# Patient Record
Sex: Female | Born: 1955 | Race: White | Hispanic: No | Marital: Married | Smoking: Never smoker
Health system: Southern US, Community
[De-identification: ages and names within clinical notes are randomized; demographics above are authoritative.]

## PROBLEM LIST (undated history)

## (undated) DIAGNOSIS — K859 Acute pancreatitis without necrosis or infection, unspecified: Secondary | ICD-10-CM

## (undated) DIAGNOSIS — I2510 Atherosclerotic heart disease of native coronary artery without angina pectoris: Secondary | ICD-10-CM

## (undated) DIAGNOSIS — I10 Essential (primary) hypertension: Secondary | ICD-10-CM

## (undated) DIAGNOSIS — J180 Bronchopneumonia, unspecified organism: Secondary | ICD-10-CM

## (undated) DIAGNOSIS — R42 Dizziness and giddiness: Secondary | ICD-10-CM

## (undated) DIAGNOSIS — Z1231 Encounter for screening mammogram for malignant neoplasm of breast: Principal | ICD-10-CM

## (undated) DIAGNOSIS — R928 Other abnormal and inconclusive findings on diagnostic imaging of breast: Principal | ICD-10-CM

## (undated) DIAGNOSIS — R918 Other nonspecific abnormal finding of lung field: Secondary | ICD-10-CM

## (undated) DIAGNOSIS — F17211 Nicotine dependence, cigarettes, in remission: Secondary | ICD-10-CM

## (undated) DIAGNOSIS — Z411 Encounter for cosmetic surgery: Secondary | ICD-10-CM

## (undated) DIAGNOSIS — R110 Nausea: Secondary | ICD-10-CM

## (undated) DIAGNOSIS — R9389 Abnormal findings on diagnostic imaging of other specified body structures: Secondary | ICD-10-CM

## (undated) DIAGNOSIS — K449 Diaphragmatic hernia without obstruction or gangrene: Secondary | ICD-10-CM

## (undated) DIAGNOSIS — R197 Diarrhea, unspecified: Secondary | ICD-10-CM

## (undated) DIAGNOSIS — F419 Anxiety disorder, unspecified: Secondary | ICD-10-CM

## (undated) DIAGNOSIS — Z1159 Encounter for screening for other viral diseases: Secondary | ICD-10-CM

## (undated) DIAGNOSIS — H5789 Other specified disorders of eye and adnexa: Secondary | ICD-10-CM

## (undated) DIAGNOSIS — R232 Flushing: Secondary | ICD-10-CM

## (undated) DIAGNOSIS — K52839 Microscopic colitis, unspecified: Secondary | ICD-10-CM

## (undated) DIAGNOSIS — J189 Pneumonia, unspecified organism: Secondary | ICD-10-CM

## (undated) HISTORY — PX: CHOLECYSTECTOMY: SHX55

## (undated) HISTORY — PX: CARDIAC CATHETERIZATION: SHX172

## (undated) HISTORY — PX: PLACEMENT OF BREAST IMPLANTS: SHX6334

---

## 1997-12-11 ENCOUNTER — Inpatient Hospital Stay (HOSPITAL_COMMUNITY): Admission: EM | Admit: 1997-12-11 | Discharge: 1997-12-15 | Payer: Self-pay | Admitting: Emergency Medicine

## 1997-12-18 ENCOUNTER — Encounter (HOSPITAL_COMMUNITY): Admission: RE | Admit: 1997-12-18 | Discharge: 1998-03-18 | Payer: Self-pay | Admitting: Psychiatry

## 1999-04-19 ENCOUNTER — Encounter: Payer: Self-pay | Admitting: Family Medicine

## 1999-04-19 ENCOUNTER — Encounter: Admission: RE | Admit: 1999-04-19 | Discharge: 1999-04-19 | Payer: Self-pay | Admitting: Family Medicine

## 2000-05-08 ENCOUNTER — Encounter: Admission: RE | Admit: 2000-05-08 | Discharge: 2000-05-08 | Payer: Self-pay | Admitting: Family Medicine

## 2000-05-08 ENCOUNTER — Encounter: Payer: Self-pay | Admitting: Family Medicine

## 2002-01-07 ENCOUNTER — Encounter: Payer: Self-pay | Admitting: Family Medicine

## 2002-01-07 ENCOUNTER — Encounter: Admission: RE | Admit: 2002-01-07 | Discharge: 2002-01-07 | Payer: Self-pay | Admitting: Family Medicine

## 2002-05-29 ENCOUNTER — Emergency Department (HOSPITAL_COMMUNITY): Admission: EM | Admit: 2002-05-29 | Discharge: 2002-05-29 | Payer: Self-pay | Admitting: Emergency Medicine

## 2002-05-29 ENCOUNTER — Encounter: Payer: Self-pay | Admitting: Emergency Medicine

## 2002-06-01 ENCOUNTER — Encounter: Payer: Self-pay | Admitting: Emergency Medicine

## 2002-06-01 ENCOUNTER — Inpatient Hospital Stay (HOSPITAL_COMMUNITY): Admission: EM | Admit: 2002-06-01 | Discharge: 2002-06-07 | Payer: Self-pay | Admitting: Emergency Medicine

## 2002-06-02 ENCOUNTER — Encounter: Payer: Self-pay | Admitting: Internal Medicine

## 2002-06-06 ENCOUNTER — Encounter: Payer: Self-pay | Admitting: Internal Medicine

## 2002-08-16 ENCOUNTER — Emergency Department (HOSPITAL_COMMUNITY): Admission: EM | Admit: 2002-08-16 | Discharge: 2002-08-17 | Payer: Self-pay | Admitting: Emergency Medicine

## 2002-08-16 ENCOUNTER — Encounter: Payer: Self-pay | Admitting: Emergency Medicine

## 2002-08-17 ENCOUNTER — Inpatient Hospital Stay (HOSPITAL_COMMUNITY): Admission: EM | Admit: 2002-08-17 | Discharge: 2002-08-18 | Payer: Self-pay | Admitting: Psychiatry

## 2002-08-23 ENCOUNTER — Other Ambulatory Visit (HOSPITAL_COMMUNITY): Admission: RE | Admit: 2002-08-23 | Discharge: 2002-09-15 | Payer: Self-pay | Admitting: Psychiatry

## 2005-11-19 ENCOUNTER — Encounter: Admission: RE | Admit: 2005-11-19 | Discharge: 2005-11-19 | Payer: Self-pay | Admitting: Obstetrics and Gynecology

## 2006-12-04 ENCOUNTER — Encounter: Admission: RE | Admit: 2006-12-04 | Discharge: 2006-12-04 | Payer: Self-pay | Admitting: Obstetrics and Gynecology

## 2007-12-31 ENCOUNTER — Encounter: Admission: RE | Admit: 2007-12-31 | Discharge: 2007-12-31 | Payer: Self-pay | Admitting: Obstetrics and Gynecology

## 2009-01-02 ENCOUNTER — Encounter: Admission: RE | Admit: 2009-01-02 | Discharge: 2009-01-02 | Payer: Self-pay | Admitting: Obstetrics and Gynecology

## 2009-03-01 ENCOUNTER — Ambulatory Visit (HOSPITAL_BASED_OUTPATIENT_CLINIC_OR_DEPARTMENT_OTHER): Admission: RE | Admit: 2009-03-01 | Discharge: 2009-03-01 | Payer: Self-pay | Admitting: Obstetrics and Gynecology

## 2010-01-31 ENCOUNTER — Encounter: Admission: RE | Admit: 2010-01-31 | Discharge: 2010-01-31 | Payer: Self-pay | Admitting: Obstetrics and Gynecology

## 2010-04-14 ENCOUNTER — Encounter: Payer: Self-pay | Admitting: Obstetrics and Gynecology

## 2010-08-09 NOTE — Discharge Summary (Signed)
NAME:  Veronica, Warner                        ACCOUNT NO.:  [id number]   MEDICAL RECORD NO.:  [id number]                   PATIENT TYPE:  INP   LOCATION:  5533                                 FACILITY:  [facility]   PHYSICIAN:  Sherin Quarry, MD                   DATE OF BIRTH:  Dec 24, 1955   DATE OF ADMISSION:  06/01/2002  DATE OF DISCHARGE:  06/07/2002                                 DISCHARGE SUMMARY   HISTORY:  Veronica Warner is a 55 year old lady with a previous history of  alcohol abuse who had been seen in the emergency room on March 7 for acute  intoxication.  Subsequently she developed abdominal pain and associated  dizziness and returned to the emergency room on March 10 where she was seen  by Dr. Nehemiah Settle for evaluation.  At that time she reported onset of abdominal  pain the previous weekend associated with nausea and vomiting.   PHYSICAL EXAMINATION:  VITALS:  Blood pressure 158, pulses 112, respirations  were 20.  HEENT:  Within normal limits.  CHEST:  Clear.  CARDIOVASCULAR:  Revealed normal S1, S2, without murmurs, rubs or gallops.  ABDOMEN:  The patient had epigastric tenderness with evidence of guarding  without rebound.  She also had evidence of hepatomegaly with liver edge  being about six centimeters below the right costal margin.   LABORATORY DATA:  Laboratory studies obtained at that time revealed AST 183,  ALT 62, total bilirubin 1.3, lipase 297.  The amylase was not tested at that  time.  The white count was 7,700, hemoglobin 12.4, hematocrit 35.1.  The  sodium was 130, potassium 2.9, calcium level was noted to be 4.6.  Creatinine was 1.2, BUN was 7.  A CT scan of the abdomen was obtained after  the KUB showed only a nonspecific bowel gas pattern.  The CT scan of the  abdomen showed bibasilar atelectasis.  The liver was prominent and showed  diffuse fatty infiltration.  There is evidence of extensive pancreatitis.  There was no evidence of pancreatic abscess,  mass or pseudocyst.   HOSPITAL COURSE:  On admission the patient was given intravenous fluids with  D5 normal saline at 250 cc per hour.  She was given Reglan 5 mg IV t.i.d.  and was placed on an Ativan alcohol withdrawal protocol.  She was initially  NPO.  Protonix was given 40 mg IV daily.  By March 13 the patient was  substantially improved from a clinical standpoint and her diet was advanced.  She was able to tolerate the advanced diet.  By that time her abdominal pain  was essentially resolved.  The serial laboratory studies revealed that her  lipase level had come down to 100 by March 13.  A repeat CT scan of the  abdomen was done March 15.  This showed no change.  Again, there were no  signs of abscess formation,  there were no signs of pseudocyst formation.  By  March 16 the patient was felt to be a candidate for discharge.  At that time  I discussed with her in great length the absolutely essential importance of  not drinking any more alcohol.  She indicated that she was planning to  resume participation with an AA group in [place].   DISCHARGE DIAGNOSES:  1. Alcoholic pancreatitis.  2. Longstanding history of alcohol abuse.  3. Alcoholic hepatitis.  4. Dehydration.  5. History of hypertension.  6. History of depression.   DISCHARGE MEDICATIONS:  The patient will continue her usual medicines which  consisted of trazodone, Effexor, clonidine and Hyzaar.  It would probably be  prudent to withhold the clonidine until she returns to see Dr. Darrelyn Hillock and  her blood pressure status can be re-assessed.  In addition, she is advised  to take thiamine 100 mg daily, folic acid 1 mg daily, Protonix 40 mg daily,  Os-Cal with D 500 mg t.i.d.  She was also given a small number of Ativan  tablets with instructions to complete the Ativan taper as indicated.  A  follow up will be arranged with Dr. Darrelyn Hillock in approximately 10 days.  I wish  to again state that I made it very clear to this  patient that if she resumes  drinking alcohol she will almost certainly immediately return to the  hospital as this will greatly aggravate her pancreatitis.   CONDITION ON DISCHARGE:  Good.                                               Sherin Quarry, MD    SY/MEDQ  D:  06/07/2002  T:  06/08/2002  Job:  [id number]   cc:   Verner Mould, M.D.  [facility]

## 2010-08-09 NOTE — Discharge Summary (Signed)
NAME:  ARRIN, ISHLER NO.:  [id number]   MEDICAL RECORD NO.:  [id number]                   PATIENT TYPE:  IPS   LOCATION:  0508                                 [facility]   PHYSICIAN:  Geoffery Lyons, M.D.                   DATE OF BIRTH:  10-Aug-1955   DATE OF ADMISSION:  08/17/2002  DATE OF DISCHARGE:  08/18/2002                                 DISCHARGE SUMMARY   CHIEF COMPLAINT AND PRESENT ILLNESS:  This was the second admission to [facility] for this 55 year old married white female,  voluntarily admitted.  She had a history of alcohol dependence.  She went to  the emergency department because she was having abdominal pain.  She was  hospitalized in March for pancreatitis.  She was drinking for 10 years,  drinking one half of a fifth per day.  Last drink was on Monday.  She drank  some wine; relapsed about two weeks prior to this admission.  She became  scared after she had the pain.  Apparently, her husband drank as well but  not to the extent that she drank.   PAST PSYCHIATRIC HISTORY:  This was the second time at [facility].  She was here five years prior to this admission in 1999 for alcohol  detoxification.  Longest history of sobriety was six months.   SUBSTANCE ABUSE HISTORY:  She drank one half of a fifth of vodka every day  for the past two years.  No blackouts, no seizures.  She drank in the  morning.   PAST MEDICAL HISTORY:  Pancreatitis.   MEDICATIONS:  1. She had been on Effexor, tapering it off.  No medications since Sunday.  2. She had been on Protonix and clonidine 0.1 mg for cravings.   PHYSICAL EXAMINATION:  Physical examination was performed, failed to show  any acute findings.   MENTAL STATUS EXAM:  Mental status exam revealed an alert, cooperative,  middle-aged female, good eye contact, dressed in the hospital gown.  Speech  was clear.  Mood was anxious.  Thought processes  were coherent; no evidence  of psychosis, no auditory and visual hallucinations.  Cognitive: Cognition  was well preserved.   ADMISSION DIAGNOSES:   AXIS I:  1. Alcohol dependence.  2. Substance-induced anxiety.   AXIS II:  No diagnosis.   AXIS III:  Pancreatitis.   AXIS IV:  Moderate.   AXIS V:  Global assessment of functioning upon admission 40, highest global  assessment of functioning in the last year 65.   LABORATORY DATA:  Other laboratory workup: Thyroid profile was within normal  limits.   HOSPITAL COURSE:  She was admitted and detoxified using Librium.  She was  maintained on Zyrtec and Protonix.  She was restarted on Effexor.  She  endorsed that she was feeling much better.  On May 27,  she was in full  contact with reality, no suicidal ideas, no homicidal ideas, no active  withdrawal, no delusional hallucinations, increased insight, wanting to  abstain.  She was wanting to go home as she had to resume her usual  responsibilities as husband was going to be out of town.  She was willing to  go to [facility].  She was feeling somewhat anxious but able to work on a  relapse prevention plan.   DISCHARGE DIAGNOSES:   AXIS I:  1. Alcohol dependence.  2. Anxiety disorder, not otherwise specified.   AXIS II:  No diagnosis.   AXIS III:  Pancreatitis.   AXIS IV:  Moderate.   AXIS V:  Global assessment of functioning upon discharge 55-60.   DISCHARGE MEDICATIONS:  1. Librium taper 25 mg one three times a day for a day, then one twice a day     for a day, then one daily for a day.  2. Zyrtec 5 mg daily.  3. Protonix 40 mg daily.  4. Effexor XR 37.5 mg daily.   FOLLOW UP:  She was to follow up with [facility].                                                 Geoffery Lyons, M.D.    IL/MEDQ  D:  09/28/2002  T:  09/29/2002  Job:  [id number]

## 2010-08-09 NOTE — Discharge Summary (Signed)
NAME:  Veronica Warner, Veronica Warner                        ACCOUNT NO.:  [id number]   MEDICAL RECORD NO.:  [id number]                   PATIENT TYPE:  INP   LOCATION:  5533                                 FACILITY:  [facility]   PHYSICIAN:  Stephanie Swaziland, NP                DATE OF BIRTH:  1955-11-29   DATE OF ADMISSION:  06/01/2002  DATE OF DISCHARGE:                                 DISCHARGE SUMMARY   DISCHARGE DIAGNOSES:  1. Pancreatitis secondary to alcohol abuse.  2. Electrolyte abnormalities.  3. Malnutrition.  4. Depression.  5. Alcohol abuse.  6. Macrocytic anemia.  7. Hypertension.   DISCHARGE MEDICATIONS:  1. Multivitamin daily.  2. Pancrease 2 capsule before each meal or snack.  3. Os-Cal plus vitamin D 1 tablet t.i.d.  4. Protonix 40 mg daily.  5. Magnesium oxide 400 mg b.i.d.  6. Effexor 75 mg daily.   PROCEDURES:  1. Abdominal x-ray June 01, 2002, nonspecific bowel gas pattern without     evidence of bowel obstruction or pneumoperitoneum. Calcifications in the     ____________ pelvis nonspecific. No evidence of acute cardiopulmonary     disease.  2. A CT scan of the abdomen June 02, 2002, extensive pancreatitis with     inflammatory changes surrounding the entire pancreas and extending     throughout the retroperitoneum and also into the peritoneum. This is     associated with multiple loculations of fluid but no focal abscess, no     definite underlying pancreatic mass or evidence of overt pancreatic     necrosis.  3. A CT scan of the pelvis revealed moderate free fluid in the pelvis. No     focal abscess.  4. A CT scan of the abdomen and pelvis on June 06, 2002, to be dictated as     an addendum.   LABORATORY DATA:  Sodium 135, potassium 3.5, chloride 106, CO2 24, glucose  102, BUN 2, creatinine 0.7, calcium 6.4,. Magnesium 1.3. White blood cells  4.0, red blood cells 2.58, hemoglobin 9.3, hematocrit 26.7, MCV 103.6,  platelets 166. Lipase 100, amylase 57. TSH  1.007. Prealbumin 13.4. PT 11.5,  INR 0.8. CK 71, CK-MB 1.1, troponin 0.01. Alcohol level less than 5.  Urinalysis was negative.   DISPOSITION:  The patient is being discharged to home.   CONDITION ON DISCHARGE:  Stable.   HISTORY OF PRESENT ILLNESS:  This is a 55 year old female with a history of  alcohol abuse who was seen in the ED on May 29, 2002, for dizziness, at  which time she was she was actually intoxicated. The patient returned on  June 01, 2002, with complaints of dizziness and abdominal pain. Her labs at  that time were consistent with pancreatitis. Her lipase was elevated at 297,  her alcohol level was less than 5. She had positive abdominal guarding. She  was admitted for further evaluation and treatment.  HOSPITAL COURSE:  PROBLEM #1, PANCREATITIS SECONDARY TO ALCOHOL ABUSE:  The  patient was kept n.p.o. and provided with IV fluids and antiemetics. Her  amylase and lipase were repeated on June 04, 2002. Her lipase was down to  100, amylase was normal at 57.   The patient had an episode of epigastric discomfort. Cardiac enzymes were  obtained x1 which were normal. Her abdominal discomfort improved slowly. She  continued to have some nausea but no vomiting. Her diet was advanced. A CT  scan of her abdomen and pelvis was obtained on the 11th with results as  noted above.   Prior to discharge the patient was  tolerating a regular diet. A CT scan of  her abdomen and pelvis was ordered and are to be dictated as an addendum to  check for possible phlegm or abscess. On her examination prior to discharge  she had no abdominal pain, no emesis.   PROBLEM #2, ELECTROLYTE ABNORMALITIES:  The patient had an episodes of  hypokalemia. This was replaced and collected prior to  discharge. She had a  magnesium level on June 02, 2002, which was borderline at 1.5, and this was  repeated on June 04, 2002, and was low at 1.3. Magnesium oxide was started.  The patient was having   episodes of loose stools, most likely secondary to  magnesium.   PROBLEM #3, MALNUTRITION:  The patient is malnourished. Calcium is low.  Nutritional supplements were provided along with Pancrease and calcium  supplementation.   PROBLEM #4, DEPRESSION: She was  previously on Effexor. She should continue  this as previously prescribed.   PROBLEM #5, MACROCYTIC ANEMIA:  The patient has an obvious macrocytic anemia  with thrombocytopenia. 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 She had no obvious signs of GI bleeding. She is  hemodynamically stable. This should be followed up on an outpatient basis by  her primary care physician.   PROBLEM #6, HYPERTENSION:  Upon presentation the patient was slightly  hypotensive and her antihypertensives were held. She was previously on  Clonidine 0.5 mg daily and Hyzaar. Her blood pressure was controlled  throughout her  hospitalization. These should be restarted by her primary  care physician if necessary.   PROBLEM #7, ETOH ABUSE:  The patient has been strongly advised and counseled  for the need to abstain from alcohol use.   FOLLOW UP:  The patient is to follow up with her primary care physician in  approximately 1 week. She is to have a followup CBC and BMET.                                                Stephanie Swaziland, NP    SJ/MEDQ  D:  06/06/2002  T:  06/07/2002  Job:  [id number]

## 2010-08-09 NOTE — H&P (Signed)
NAME:  Veronica Warner, Veronica Warner NO.:  [id number]   MEDICAL RECORD NO.:  [id number]                   PATIENT TYPE:  IPS   LOCATION:  0508                                 [facility]   PHYSICIAN:  Geoffery Lyons, M.D.                   DATE OF BIRTH:  April 13, 1955   DATE OF ADMISSION:  08/17/2002  DATE OF DISCHARGE:                         PSYCHIATRIC ADMISSION ASSESSMENT   IDENTIFYING INFORMATION:  A 55 year old married white female voluntarily  admitted on Aug 17, 2002.   HISTORY OF PRESENT ILLNESS:  Patient presents with a history of alcohol  abuse.  Patient states she went to the emergency department yesterday  because she was having abdominal pain.  Patient was hospitalized in March  for pancreatitis.  Abdominal pain had returned.  Patient has been drinking  for 10 years, drinking about one-half of a fifth per day.  Her last drink  was on Monday where she drank some wine.  She states she relapsed about two  weeks ago.  Patient states she wanted to nip it in the bud.  She became  scared in regards to her pancreatitis and abdominal pain.  She reports that  her husband drinks as well but not to the extent that she drinks.  She  states she has been sleeping fair with the clonidine.  She lost 12 pounds  when patient was ill, but some of her weight has returned.  Denies any  hallucinations.  Patient reports that she wants to do family counseling to  remain sober and hoping that her husband will also stop.   PAST PSYCHIATRIC HISTORY:  Second admission to [facility];  here five years ago, in 1999, for alcohol detox.  Longest history of  sobriety has been six months, and that was five years ago where she states  she went through the 12-step program then.   SOCIAL HISTORY:  She is a 55 year old married white female, married for 18  years, first marriage, two children, ages 57 and 64.  She lives with her  husband and children.  She has been out of work  for one year; she was fired  from her job.  She was reported to have alcohol on her breath and was  reported to be drinking on the job.  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   FAMILY HISTORY:  Mother with alcohol problems, is deceased.   ALCOHOL AND DRUG HISTORY:  Smokes.  She drinks one-half of a fifth of vodka  every day for the past two years.  No blackouts.  No seizures.  She drinks  in the morning and denies any drug use.   MEDICAL PROBLEMS:  Pancreatitis; was hospitalized for six days at [facility]  in March of 2004 and did not drink for six weeks after.   MEDICATIONS:  1. Has been on Effexor, has been tapering herself  off and has not had any     medicine since Sunday; she states she just does not like taking any     medicines.  2. Has been on Protonix.  3. Has been taking clonidine 0.1 mg for sleeping cravings.   DRUG ALLERGIES:  No known allergies.   PHYSICAL EXAMINATION:  GENERAL:  Physical exam done at [facility].  Patient  appears in no acute distress.  She looks very tired.  Her eyes are red-  rimmed, which she states is from chlorine.  No tremors are noted.  VITAL SIGNS:  Temp is 98.2, 110 heart rate, 18 respirations, blood pressure  is 172/93.  She weighs 139 pounds.   LABORATORY DATA:  Urinalysis was negative.  Urine drug screen was negative.  CBC within normal limits.  CMET within normal limits.  Alcohol level is 277.  Lipase is 18.   MENTAL STATUS EXAM:  She is an alert, cooperative, middle-aged female with  good eye contact, dressed in the hospital gown.  Speech is clear.  Mood is  anxious.  Patient appears somewhat anxious as well.  Thought processes are  coherent; no evidence of psychosis.  No auditory or visual hallucinations.  Cognitive function:  Intact.  Memory is fair.  Judgment and insight are  fair.   DIAGNOSES:   AXIS I:  1. Alcohol dependence.  2. Anxiety disorder, not otherwise specified.   AXIS II:  Deferred.   AXIS  III:  Pancreatitis.   AXIS IV:  Problems with occupational, other psychosocial problems related to  alcohol use, medical problems.   AXIS V:  1. Current is 40.  2. This past year, 36.   PLAN:  Voluntary admission for alcohol abuse.  Contract for safety, check  every 15 minutes.  We will initiate low-dose Librium to detox safely.  Encourage fluids.  We will resume her antidepressant.  Medication compliance  was discussed with the patient.  Patient is to remain alcohol-free.  Patient  to follow up with AA and see the IOP program.   ESTIMATED LENGTH OF STAY:  Three to five days.     Landry Corporal, N.P.                       Geoffery Lyons, M.D.    JO/MEDQ  D:  08/17/2002  T:  08/17/2002  Job:  [id number]

## 2011-04-25 ENCOUNTER — Other Ambulatory Visit: Payer: Self-pay | Admitting: Obstetrics and Gynecology

## 2011-04-25 DIAGNOSIS — Z1231 Encounter for screening mammogram for malignant neoplasm of breast: Secondary | ICD-10-CM

## 2011-05-14 ENCOUNTER — Ambulatory Visit
Admission: RE | Admit: 2011-05-14 | Discharge: 2011-05-14 | Disposition: A | Discharge status: Home / Self Care | Payer: BC Managed Care – PPO | Source: Ambulatory Visit | Attending: Obstetrics and Gynecology | Admitting: Obstetrics and Gynecology

## 2011-05-14 DIAGNOSIS — Z1231 Encounter for screening mammogram for malignant neoplasm of breast: Secondary | ICD-10-CM

## 2012-08-31 ENCOUNTER — Other Ambulatory Visit: Payer: Self-pay

## 2012-08-31 DIAGNOSIS — Z1231 Encounter for screening mammogram for malignant neoplasm of breast: Secondary | ICD-10-CM

## 2012-10-14 ENCOUNTER — Ambulatory Visit
Admission: RE | Admit: 2012-10-14 | Discharge: 2012-10-14 | Disposition: A | Discharge status: Home / Self Care | Payer: BC Managed Care – PPO | Source: Ambulatory Visit

## 2012-10-14 DIAGNOSIS — Z1231 Encounter for screening mammogram for malignant neoplasm of breast: Secondary | ICD-10-CM

## 2018-01-25 ENCOUNTER — Ambulatory Visit: Attending: Family Medicine | Primary: Family Medicine

## 2018-01-25 ENCOUNTER — Ambulatory Visit: Admit: 2018-01-25 | Discharge: 2018-01-25 | Payer: PRIVATE HEALTH INSURANCE | Attending: Family Medicine

## 2018-01-25 DIAGNOSIS — M25511 Pain in right shoulder: Secondary | ICD-10-CM

## 2018-01-25 MED ORDER — LISINOPRIL 10 MG TAB
10 mg | ORAL_TABLET | Freq: Every day | ORAL | 1 refills | Status: DC
Start: 2018-01-25 — End: 2018-02-22

## 2018-01-25 NOTE — Progress Notes (Signed)
CC: New patient, establish care    HPI: Pt is a 62 y.o. female who presents for new patient, establish care. She moved to [place] from [place] 4 years ago but has been commuting there for her doctor visits, until now.   She has had elevated BP for several years now on and off. She states can feel when her BP is going high and knows it has been running that way. She has been taking one of her husband's propranolol when this happens.   She recently moved into a new house and had been doing all the unpacking. She became very sore after that all over her body but particularly her shoulders and hips. She has been getting deep tissue massage which has been very helpful and her pain is much improved.   She has a h/o palpitations where it feels like her heart is beating fast and then skips a beat. She had a work-up with Cardiology in [place] including a stress test, echo and Holter monitor. She never got the results of theses tests.   Pt states she has a h/o alcoholism but has not had a drink since 2004. She had pancreatitis once related to alcohol, which has never happened again. She denies other complications like cirrhosis or varices.     Previous clinics:  [facility] in [place]  Dr. Cherly Hensen, [facility], [place], [place]      Past Medical History:   Diagnosis Date   ??? Anxiety    ??? Depression    ??? Hypertension    ??? Pancreatitis 2004   ??? Recovering alcoholic (HCC)        Family History   Problem Relation Age of Onset   ??? Hypertension Mother    ??? Cancer Mother         glioblastoma   ??? Thyroid Disease Mother         Unsure if overactive or underactive   ??? Thyroid Disease Brother    ??? Hypertension Brother        Social History     Tobacco Use   ??? Smoking status: Not on file   Substance Use Topics   ??? Alcohol use: Not on file   ??? Drug use: Not on file       ROS:  Per HPI    PE:  Visit Vitals  BP 153/75   Pulse 66   Temp 97.2 ??F (36.2 ??C) (Oral)   Resp 16   Ht 5' 5.5" (1.664 m)   Wt 144 lb (65.3 kg)   SpO2 98%   BMI 23.60  kg/m??     Gen: Pt sitting in chair, in NAD  Head: Normocephalic, atraumatic  Eyes: Sclera anicteric, EOM grossly intact, PERRL  Throat: MMM, normal lips, tongue and gums  Neck: Supple, no LAD, no thyromegaly or carotid bruits  CVS: Normal S1, S2, no m/r/g  Resp: CTAB, no wheezes or rales  Extrem: Atraumatic, no cyanosis or edema  Pulses: 2+   Skin: Warm, dry  Neuro: Alert, oriented, appropriate      A/P:   Encounter Diagnoses     ICD-10-CM ICD-9-CM   1. Acute pain of both shoulders M25.511 719.41    M25.512    2. Pain of both hip joints M25.551 719.45    M25.552    3. Essential hypertension I10 401.9   4. H/O alcohol dependence (HCC) F10.21 303.93     1. Acute pain of both shoulders: responding well to massage  -  Continue massage therapy    2. Pain of both hip joints  - Continue massage therapy    3. Essential hypertension: As she has several elevated values (per her report), will start on medication. Discussed options and she would like to try lisinopril. Discussed side effects to look out for.  - lisinopril (PRINIVIL, ZESTRIL) 10 mg tablet; Take 1 Tab by mouth daily.  Dispense: 30 Tab; Refill: 1       RTC in 2 months for BP check and labs after starting lisinopril. Will request records from prior practitioners in the meantime. Pt will call before then if BP's are staying >150/90 and will send a MyChart BP flowsheet.       Discussed diagnoses in detail with patient.   Medication risks/benefits/side effects discussed with patient.   All of the patient's questions were addressed. The patient understands and agrees with our plan of care.  The patient knows to call back if they are unsure of or forget any changes we discussed today or if the symptoms change.  The patient received an After-Visit Summary which contains VS, orders, medication list and allergy list. This can be used as a "mini-medical record" should they have to seek medical care while out of town.    Current Outpatient Medications on File Prior to Visit    Medication Sig Dispense Refill   ??? levocetirizine (XYZAL) 5 mg tablet Take  by mouth daily as needed.  3   ??? venlafaxine-SR (EFFEXOR-XR) 37.5 mg capsule Take 37.5 mg by mouth daily.  2     No current facility-administered medications on file prior to visit.

## 2018-01-25 NOTE — Patient Instructions (Addendum)
DASH Diet: Care Instructions  Your Care Instructions    The DASH diet is an eating plan that can help lower your blood pressure. DASH stands for Dietary Approaches to Stop Hypertension. Hypertension is high blood pressure.  The DASH diet focuses on eating foods that are high in calcium, potassium, and magnesium. These nutrients can lower blood pressure. The foods that are highest in these nutrients are fruits, vegetables, low-fat dairy products, nuts, seeds, and legumes. But taking calcium, potassium, and magnesium supplements instead of eating foods that are high in those nutrients does not have the same effect. The DASH diet also includes whole grains, fish, and poultry.  The DASH diet is one of several lifestyle changes your doctor may recommend to lower your high blood pressure. Your doctor may also want you to decrease the amount of sodium in your diet. Lowering sodium while following the DASH diet can lower blood pressure even further than just the DASH diet alone.  Follow-up care is a key part of your treatment and safety. Be sure to make and go to all appointments, and call your doctor if you are having problems. It's also a good idea to know your test results and keep a list of the medicines you take.  How can you care for yourself at home?  Following the DASH diet  ?? Eat 4 to 5 servings of fruit each day. A serving is 1 medium-sized piece of fruit, ?? cup chopped or canned fruit, 1/4 cup dried fruit, or 4 ounces (?? cup) of fruit juice. Choose fruit more often than fruit juice.  ?? Eat 4 to 5 servings of vegetables each day. A serving is 1 cup of lettuce or raw leafy vegetables, ?? cup of chopped or cooked vegetables, or 4 ounces (?? cup) of vegetable juice. Choose vegetables more often than vegetable juice.  ?? Get 2 to 3 servings of low-fat and fat-free dairy each day. A serving is 8 ounces of milk, 1 cup of yogurt, or 1 ?? ounces of cheese.   ?? Eat 6 to 8 servings of grains each day. A serving is 1 slice of bread, 1 ounce of dry cereal, or ?? cup of cooked rice, pasta, or cooked cereal. Try to choose whole-grain products as much as possible.  ?? Limit lean meat, poultry, and fish to 2 servings each day. A serving is 3 ounces, about the size of a deck of cards.  ?? Eat 4 to 5 servings of nuts, seeds, and legumes (cooked dried beans, lentils, and split peas) each week. A serving is 1/3 cup of nuts, 2 tablespoons of seeds, or ?? cup of cooked beans or peas.  ?? Limit fats and oils to 2 to 3 servings each day. A serving is 1 teaspoon of vegetable oil or 2 tablespoons of salad dressing.  ?? Limit sweets and added sugars to 5 servings or less a week. A serving is 1 tablespoon jelly or jam, ?? cup sorbet, or 1 cup of lemonade.  ?? Eat less than 2,300 milligrams (mg) of sodium a day. If you limit your sodium to 1,500 mg a day, you can lower your blood pressure even more.  Tips for success  ?? Start small. Do not try to make dramatic changes to your diet all at once. You might feel that you are missing out on your favorite foods and then be more likely to not follow the plan. Make small changes, and stick with them. Once those changes become habit, add a   few more changes.  ?? Try some of the following:  ? Make it a goal to eat a fruit or vegetable at every meal and at snacks. This will make it easy to get the recommended amount of fruits and vegetables each day.  ? Try yogurt topped with fruit and nuts for a snack or healthy dessert.  ? Add lettuce, tomato, cucumber, and onion to sandwiches.  ? Combine a ready-made pizza crust with low-fat mozzarella cheese and lots of vegetable toppings. Try using tomatoes, squash, spinach, broccoli, carrots, cauliflower, and onions.  ? Have a variety of cut-up vegetables with a low-fat dip as an appetizer instead of chips and dip.  ? Sprinkle sunflower seeds or chopped almonds over salads. Or try adding  chopped walnuts or almonds to cooked vegetables.  ? Try some vegetarian meals using beans and peas. Add garbanzo or kidney beans to salads. Make burritos and tacos with mashed pinto beans or black beans.  Where can you learn more?  Go to http://www.healthwise.net/GoodHelpConnections.  Enter H967 in the search box to learn more about "DASH Diet: Care Instructions."  Current as of: June 30, 2017  Content Version: 12.2  ?? 2006-2019 [employer]. Care instructions adapted under license by [employer] (which disclaims liability or warranty for this information). If you have questions about a medical condition or this instruction, always ask your healthcare professional. Healthwise, Incorporated disclaims any warranty or liability for your use of this information.

## 2018-01-25 NOTE — Progress Notes (Signed)
CC: New patient, establish care    HPI: Pt is a 62 y.o. female who presents for new patient, establish care. She moved to [place] from [place] 4 years ago but has been commuting there for her doctor visits, until now.   She has had elevated BP for several years now on and off. She states can feel when her BP is going high and knows it has been running that way. She has been taking one of her husband's propranolol when this happens.   She recently moved into a new house and had been doing all the unpacking. She became very sore after that all over her body but particularly her shoulders and hips. She has been getting deep tissue massage which has been very helpful and her pain is much improved.   She has a h/o palpitations where it feels like her heart is beating fast and then skips a beat. She had a work-up with Cardiology in [place] including a stress test, echo and Holter monitor. She never got the results of theses tests.   Pt states she has a h/o alcoholism but has not had a drink since 2004. She had pancreatitis once related to alcohol, which has never happened again. She denies other complications like cirrhosis or varices.     Previous clinics:  [facility] in [place]  Dr. Cherly Hensen, [facility], [place], [place]      Past Medical History:   Diagnosis Date   ??? Anxiety    ??? Depression    ??? Hypertension    ??? Pancreatitis 2004   ??? Recovering alcoholic (HCC)        Family History   Problem Relation Age of Onset   ??? Hypertension Mother    ??? Cancer Mother         glioblastoma   ??? Thyroid Disease Mother         Unsure if overactive or underactive   ??? Thyroid Disease Brother    ??? Hypertension Brother        Social History     Tobacco Use   ??? Smoking status: Not on file   Substance Use Topics   ??? Alcohol use: Not on file   ??? Drug use: Not on file       ROS:  Per HPI    PE:  Visit Vitals  BP 153/75   Pulse 66   Temp 97.2 ??F (36.2 ??C) (Oral)   Resp 16   Ht 5' 5.5" (1.664 m)   Wt 144 lb (65.3 kg)   SpO2 98%    BMI 23.60 kg/m??     Gen: Pt sitting in chair, in NAD  Head: Normocephalic, atraumatic  Eyes: Sclera anicteric, EOM grossly intact, PERRL  Throat: MMM, normal lips, tongue and gums  Neck: Supple, no LAD, no thyromegaly or carotid bruits  CVS: Normal S1, S2, no m/r/g  Resp: CTAB, no wheezes or rales  Extrem: Atraumatic, no cyanosis or edema  Pulses: 2+   Skin: Warm, dry  Neuro: Alert, oriented, appropriate      A/P:   Encounter Diagnoses     ICD-10-CM ICD-9-CM   1. Acute pain of both shoulders M25.511 719.41    M25.512    2. Pain of both hip joints M25.551 719.45    M25.552    3. Essential hypertension I10 401.9   4. H/O alcohol dependence (HCC) F10.21 303.93     1. Acute pain of both shoulders: responding well to massage  -  Continue massage therapy    2. Pain of both hip joints  - Continue massage therapy    3. Essential hypertension: As she has several elevated values (per her report), will start on medication. Discussed options and she would like to try lisinopril. Discussed side effects to look out for.  - lisinopril (PRINIVIL, ZESTRIL) 10 mg tablet; Take 1 Tab by mouth daily.  Dispense: 30 Tab; Refill: 1       RTC in 2 months for BP check and labs after starting lisinopril. Will request records from prior practitioners in the meantime. Pt will call before then if BP's are staying >150/90 and will send a MyChart BP flowsheet.       Discussed diagnoses in detail with patient.   Medication risks/benefits/side effects discussed with patient.   All of the patient's questions were addressed. The patient understands and agrees with our plan of care.  The patient knows to call back if they are unsure of or forget any changes we discussed today or if the symptoms change.  The patient received an After-Visit Summary which contains VS, orders, medication list and allergy list. This can be used as a "mini-medical record" should they have to seek medical care while out of town.     Current Outpatient Medications on File Prior to Visit   Medication Sig Dispense Refill   ??? levocetirizine (XYZAL) 5 mg tablet Take  by mouth daily as needed.  3   ??? venlafaxine-SR (EFFEXOR-XR) 37.5 mg capsule Take 37.5 mg by mouth daily.  2     No current facility-administered medications on file prior to visit.

## 2018-02-22 ENCOUNTER — Encounter

## 2018-02-22 MED ORDER — LISINOPRIL 10 MG TAB
10 mg | ORAL_TABLET | ORAL | 0 refills | Status: DC
Start: 2018-02-22 — End: 2018-03-25

## 2018-03-25 ENCOUNTER — Encounter

## 2018-03-25 NOTE — Telephone Encounter (Signed)
Last office visit on 01/25/18  Next appointment 04/01/2018

## 2018-03-25 NOTE — Telephone Encounter (Signed)
Pt scheduled a CPE on Jan 09th

## 2018-03-26 MED ORDER — LISINOPRIL 10 MG TAB
10 mg | ORAL_TABLET | ORAL | 3 refills | Status: DC
Start: 2018-03-26 — End: 2018-04-01

## 2018-04-01 ENCOUNTER — Ambulatory Visit: Attending: Family Medicine | Primary: Family Medicine

## 2018-04-01 ENCOUNTER — Ambulatory Visit
Admit: 2018-04-01 | Discharge: 2018-04-01 | Payer: PRIVATE HEALTH INSURANCE | Attending: Family Medicine | Primary: Family Medicine

## 2018-04-01 ENCOUNTER — Encounter: Attending: Family Medicine | Primary: Family Medicine

## 2018-04-01 ENCOUNTER — Encounter

## 2018-04-01 DIAGNOSIS — Z1159 Encounter for screening for other viral diseases: Secondary | ICD-10-CM

## 2018-04-01 MED ORDER — VARICELLA-ZOSTER GLYCOE VACC-AS01B ADJ(PF) 50 MCG/0.5 ML IM SUSPENSION
50 mcg/0.5 mL | Freq: Once | INTRAMUSCULAR | 0 refills | Status: AC
Start: 2018-04-01 — End: 2018-04-01

## 2018-04-01 MED ORDER — DIPHTH,PERTUS(ACEL)TETANUS VAC(PF) 2 LF-(5-3-5MCG)-5 LF/0.5 ML IM SUSP
2 Lf-(.5-5-3-5 mcg)-5Lf/0.5 mL | INJECTION | Freq: Once | INTRAMUSCULAR | 0 refills | Status: AC
Start: 2018-04-01 — End: 2018-04-01

## 2018-04-01 MED ORDER — VENLAFAXINE SR 37.5 MG 24 HR CAP
37.5 mg | ORAL_CAPSULE | Freq: Every day | ORAL | 3 refills | Status: DC
Start: 2018-04-01 — End: 2018-10-25

## 2018-04-01 MED ORDER — LISINOPRIL 10 MG TAB
10 mg | ORAL_TABLET | ORAL | 1 refills | Status: DC
Start: 2018-04-01 — End: 2018-11-30

## 2018-04-01 NOTE — Progress Notes (Signed)
CC: [facility]    HPI: Pt is a 63 y.o. female who presents for [facility].     Last pap: 05/07/17, normal with negative HPV  History of abnormal paps?: No  Abnormal vaginal bleeding or discharge?: No  Desire to be tested for STDs today?: No  LMP: Menopause around 15-20 years ago  Last mammogram: 05/07/17, normal  Self breast checks?: No  New lumps or bumps?: No  Family history of ovarian, uterine or breast cancer?: No      Past Medical History:   Diagnosis Date   ??? Anxiety    ??? Depression    ??? Hypertension    ??? Pancreatitis 2004   ??? Recovering alcoholic (HCC)        Family History   Problem Relation Age of Onset   ??? Hypertension Mother    ??? Cancer Mother         glioblastoma   ??? Thyroid Disease Mother         Unsure if overactive or underactive   ??? Thyroid Disease Brother    ??? Hypertension Brother        Social History     Tobacco Use   ??? Smoking status: Current Every Day Smoker   ??? Smokeless tobacco: Never Used   Substance Use Topics   ??? Alcohol use: Not on file   ??? Drug use: Not on file         PE:  Visit Vitals  BP 111/65 (BP 1 Location: Left arm, BP Patient Position: Sitting)   Pulse 66   Temp 97.1 ??F (36.2 ??C) (Oral)   Resp 16   Ht 5' 5.5" (1.664 m)   Wt 141 lb (64 kg)   SpO2 98%   BMI 23.11 kg/m??     Gen: Pt sitting in chair, in NAD  Head: Normocephalic, atraumatic  Eyes: Sclera anicteric, EOM grossly intact, PERRL  Throat: MMM, normal lips, tongue and gums  Neck: Supple, no LAD, no thyromegaly or carotid bruits  CVS: Normal S1, S2, no m/r/g  Resp: CTAB, no wheezes or rales  Abd: Soft, non-tender, non-distended, +BS  Extrem: Atraumatic, no cyanosis or edema  Pulses: 2+   Skin: Warm, dry  Neuro: Alert, oriented, appropriate    A/P: Pt is a 63 y.o. female who presents for [facility]. Not due for pap and technically may not need another one if we can ensure adequate prior screening. Discussed recommendations for screening at her age.  - Lipid panel  - CMP  - TSH given strong FH  - Hep C Ab  - Rx printed for TDaP and Shingrix  - RTC in  6 months for f/u chronic conditions, or sooner prn      Discussed diagnoses in detail with patient.   Medication risks/benefits/side effects discussed with patient.   All of the patient's questions were addressed. The patient understands and agrees with our plan of care.  The patient knows to call back if they are unsure of or forget any changes we discussed today or if the symptoms change.  The patient received an After-Visit Summary which contains VS, orders, medication list and allergy list. This can be used as a "mini-medical record" should they have to seek medical care while out of town.    Current Outpatient Medications on File Prior to Visit   Medication Sig Dispense Refill   ??? lisinopril (PRINIVIL, ZESTRIL) 10 mg tablet TAKE ONE TABLET BY MOUTH EVERY DAY 30 Tab 3   ??? levocetirizine (XYZAL)  5 mg tablet Take  by mouth daily as needed.  3   ??? venlafaxine-SR (EFFEXOR-XR) 37.5 mg capsule Take 37.5 mg by mouth daily.  2     No current facility-administered medications on file prior to visit.

## 2018-04-01 NOTE — Progress Notes (Signed)
1. Have you been to the ER, urgent care clinic since your last visit?  Hospitalized since your last visit? no    2. Have you seen or consulted any other health care providers outside of the [facility] since your last visit?  Include any pap smears or colon screening. no  Reviewed record in preparation for visit and have obtained necessary documentation.  Patient did not bring medications to visit for review.  Information provided on Advanced Directive, Living Will.

## 2018-04-01 NOTE — Progress Notes (Signed)
1. Have you been to the ER, urgent care clinic since your last visit?  Hospitalized since your last visit? no    2. Have you seen or consulted any other health care providers outside of the [facility] since your last visit?  Include any pap smears or colon screening. no  Reviewed record in preparation for visit and have obtained necessary documentation.  Patient did not bring medications to visit for review.  Information provided on Advanced Directive, Living Will.

## 2018-04-01 NOTE — Patient Instructions (Addendum)
[email or web address]    Well Visit, Women 50 to 21: Care Instructions  Your Care Instructions    Physical exams can help you stay healthy. Your doctor has checked your overall health and may have suggested ways to take good care of yourself. He or she also may have recommended tests. At home, you can help prevent illness with healthy eating, regular exercise, and other steps.  Follow-up care is a key part of your treatment and safety. Be sure to make and go to all appointments, and call your doctor if you are having problems. It's also a good idea to know your test results and keep a list of the medicines you take.  How can you care for yourself at home?  ?? Reach and stay at a healthy weight. This will lower your risk for many problems, such as obesity, diabetes, heart disease, and high blood pressure.  ?? Get at least 30 minutes of exercise on most days of the week. Walking is a good choice. You also may want to do other activities, such as running, swimming, cycling, or playing tennis or team sports.  ?? Do not smoke. Smoking can make health problems worse. If you need help quitting, talk to your doctor about stop-smoking programs and medicines. These can increase your chances of quitting for good.  ?? Protect your skin from too much sun. When you're outdoors from 10 a.m. to 4 p.m., stay in the shade or cover up with clothing and a hat with a wide brim. Wear sunglasses that block UV rays. Even when it's cloudy, put broad-spectrum sunscreen (SPF 30 or higher) on any exposed skin.  ?? See a dentist one or two times a year for checkups and to have your teeth cleaned.  ?? Wear a seat belt in the car.  Follow your doctor's advice about when to have certain tests. These tests can spot problems early.  ?? Cholesterol. Your doctor will tell you how often to have this done based on your age, family history, or other things that can increase your risk for heart attack and stroke.   ?? Blood pressure. Have your blood pressure checked during a routine doctor visit. Your doctor will tell you how often to check your blood pressure based on your age, your blood pressure results, and other factors.  ?? Mammogram. Ask your doctor how often you should have a mammogram, which is an X-ray of your breasts. A mammogram can spot breast cancer before it can be felt and when it is easiest to treat.  ?? Pap test and pelvic exam. Ask your doctor how often you should have a Pap test. You may not need to have a Pap test as often as you used to.  ?? Vision. Have your eyes checked every year or two or as often as your doctor suggests. Some experts recommend that you have yearly exams for glaucoma and other age-related eye problems starting at age 64.  ?? Hearing. Tell your doctor if you notice any change in your hearing. You can have tests to find out how well you hear.  ?? Diabetes. Ask your doctor whether you should have tests for diabetes.  ?? Colorectal cancer. Your risk for colorectal cancer gets higher as you get older. Some experts say that adults should start regular screening at age 60 and stop at age 66. Others say to start before age 84 or continue after age 71. Talk with your doctor about your risk and when to start and stop screening.  ??  Thyroid disease. Talk to your doctor about whether to have your thyroid checked as part of a regular physical exam. Women have an increased chance of a thyroid problem.  ?? Osteoporosis. You should begin tests for bone density at age 1. If you are younger than 36, ask your doctor whether you have factors that may increase your risk for this disease. You may want to have this test before age 77.  ?? Heart attack and stroke risk. At least every 4 to 6 years, you should have your risk for heart attack and stroke assessed. Your doctor uses factors such as your age, blood pressure, cholesterol, and whether you  smoke or have diabetes to show what your risk for a heart attack or stroke is over the next 10 years.  When should you call for help?  Watch closely for changes in your health, and be sure to contact your doctor if you have any problems or symptoms that concern you.  Where can you learn more?  Go to [employer].  Enter [phone] in the search box to learn more about "Well Visit, Women 50 to 29: Care Instructions."  Current as of: March 05, 2017  Content Version: 12.2  ?? 2006-2019 Healthwise, Inc[employer]. Care instructions adapted under license by [employer] (which disclaims liability or warranty for this information). If you have questions about a medical condition or this instruction, always ask your healthcare professional. [employer], Incorporated disclaims any warranty or liability for your use of this information.

## 2018-04-01 NOTE — Progress Notes (Signed)
CC: [facility]    HPI: Pt is a 63 y.o. female who presents for [facility].     Last pap: 05/07/17, normal with negative HPV  History of abnormal paps?: No  Abnormal vaginal bleeding or discharge?: No  Desire to be tested for STDs today?: No  LMP: Menopause around 15-20 years ago  Last mammogram: 05/07/17, normal  Self breast checks?: No  New lumps or bumps?: No  Family history of ovarian, uterine or breast cancer?: No      Past Medical History:   Diagnosis Date   ??? Anxiety    ??? Depression    ??? Hypertension    ??? Pancreatitis 2004   ??? Recovering alcoholic (HCC)        Family History   Problem Relation Age of Onset   ??? Hypertension Mother    ??? Cancer Mother         glioblastoma   ??? Thyroid Disease Mother         Unsure if overactive or underactive   ??? Thyroid Disease Brother    ??? Hypertension Brother        Social History     Tobacco Use   ??? Smoking status: Current Every Day Smoker   ??? Smokeless tobacco: Never Used   Substance Use Topics   ??? Alcohol use: Not on file   ??? Drug use: Not on file         PE:  Visit Vitals  BP 111/65 (BP 1 Location: Left arm, BP Patient Position: Sitting)   Pulse 66   Temp 97.1 ??F (36.2 ??C) (Oral)   Resp 16   Ht 5' 5.5" (1.664 m)   Wt 141 lb (64 kg)   SpO2 98%   BMI 23.11 kg/m??     Gen: Pt sitting in chair, in NAD  Head: Normocephalic, atraumatic  Eyes: Sclera anicteric, EOM grossly intact, PERRL  Throat: MMM, normal lips, tongue and gums  Neck: Supple, no LAD, no thyromegaly or carotid bruits  CVS: Normal S1, S2, no m/r/g  Resp: CTAB, no wheezes or rales  Abd: Soft, non-tender, non-distended, +BS  Extrem: Atraumatic, no cyanosis or edema  Pulses: 2+   Skin: Warm, dry  Neuro: Alert, oriented, appropriate    A/P: Pt is a 63 y.o. female who presents for [facility]. Not due for pap and technically may not need another one if we can ensure adequate prior screening. Discussed recommendations for screening at her age.  - Lipid panel  - CMP  - TSH given strong FH  - Hep C Ab  - Rx printed for TDaP and Shingrix   - RTC in 6 months for f/u chronic conditions, or sooner prn      Discussed diagnoses in detail with patient.   Medication risks/benefits/side effects discussed with patient.   All of the patient's questions were addressed. The patient understands and agrees with our plan of care.  The patient knows to call back if they are unsure of or forget any changes we discussed today or if the symptoms change.  The patient received an After-Visit Summary which contains VS, orders, medication list and allergy list. This can be used as a "mini-medical record" should they have to seek medical care while out of town.    Current Outpatient Medications on File Prior to Visit   Medication Sig Dispense Refill   ??? lisinopril (PRINIVIL, ZESTRIL) 10 mg tablet TAKE ONE TABLET BY MOUTH EVERY DAY 30 Tab 3   ??? levocetirizine (XYZAL)  5 mg tablet Take  by mouth daily as needed.  3   ??? venlafaxine-SR (EFFEXOR-XR) 37.5 mg capsule Take 37.5 mg by mouth daily.  2     No current facility-administered medications on file prior to visit.

## 2018-04-06 LAB — METABOLIC PANEL, COMPREHENSIVE

## 2018-04-06 LAB — LIPID PANEL

## 2018-04-06 LAB — TSH 3RD GENERATION
TSH: 1.03 u[IU]/mL (ref 0.450–4.500)
TSH: 1.03 u[IU]/mL (ref 0.450–4.500)

## 2018-04-06 LAB — COMPREHENSIVE METABOLIC PANEL

## 2018-04-07 LAB — HEPATITIS C AB: Hep C Virus Ab: 0.2 s/co ratio (ref 0.0–0.9)

## 2018-04-07 LAB — HEPATITIS C ANTIBODY: HCV Ab: 0.2 s/co ratio (ref 0.0–0.9)

## 2018-04-09 ENCOUNTER — Encounter

## 2018-04-27 MED ORDER — LEVOCETIRIZINE 5 MG TAB
5 mg | ORAL_TABLET | ORAL | 1 refills | Status: DC
Start: 2018-04-27 — End: 2018-08-23

## 2018-05-05 ENCOUNTER — Ambulatory Visit: Attending: Family Medicine | Primary: Family Medicine

## 2018-05-05 ENCOUNTER — Encounter

## 2018-05-05 ENCOUNTER — Ambulatory Visit
Admit: 2018-05-05 | Discharge: 2018-05-05 | Payer: PRIVATE HEALTH INSURANCE | Attending: Family Medicine | Primary: Family Medicine

## 2018-05-05 DIAGNOSIS — J00 Acute nasopharyngitis [common cold]: Secondary | ICD-10-CM

## 2018-05-05 MED ORDER — PREDNISONE 20 MG TAB
20 mg | ORAL_TABLET | Freq: Two times a day (BID) | ORAL | 0 refills | Status: DC
Start: 2018-05-05 — End: 2018-12-27

## 2018-05-05 NOTE — Progress Notes (Signed)
1. Have you been to the ER, urgent care clinic since your last visit?  Hospitalized since your last visit?No    2. Have you seen or consulted any other health care providers outside of the [facility] since your last visit?  Include any pap smears or colon screening. No  Reviewed record in preparation for visit and have necessary documentation  Pt did not bring medication to office visit for review    Goals that were addressed and/or need to be completed during or after this appointment include     Health Maintenance Due   Topic Date Due   . Pneumococcal 0-64 years (1 of 1 - PPSV23) 12/09/1961   . DTaP/Tdap/Td series (1 - Tdap) 12/10/1966   . Shingrix Vaccine Age 26> (1 of 2) 12/09/2005   . FOBT Q1Y Age 44-75  12/09/2005

## 2018-05-05 NOTE — Progress Notes (Signed)
Patient: Kristine Banks MRN: [id number]  SSN: [id number]    Date of Birth: 1956-02-27  Age: 63 y.o.  Sex: female      Chief Complaint   Patient presents with   ??? Cough     hoarse, scratchy throat, dizziness     Kristine Banks is a 63 y.o. female new to me who presents with complaints of sore throat, dry cough, hoarseness and dizziness for 1 week.  There has been no nausea and no vomiting . she has not had  swollen glands, myalgias and fever. Symptoms are moderate. Patient is drinking plenty of fluids. There is not a hx of asthma. There is a hx of allergic rhinitis. There is a hx of tobacco use. Patient with hx of HTN and anxiety. BP well controlled. Mood stable. She is due for lab work.    Medications:     Current Outpatient Medications   Medication Sig   ??? predniSONE (DELTASONE) 20 mg tablet Take 20 mg by mouth two (2) times a day.   ??? levocetirizine (XYZAL) 5 mg tablet TAKE 1/2 TO 1 TABLET BY MOUTH EVERY DAY AS NEEDED   ??? venlafaxine-SR (EFFEXOR-XR) 37.5 mg capsule Take 1 Cap by mouth daily.   ??? lisinopril (PRINIVIL, ZESTRIL) 10 mg tablet TAKE ONE TABLET BY MOUTH EVERY DAY     No current facility-administered medications for this visit.        Problem List:   There are no active problems to display for this patient.      Medical History:     Past Medical History:   Diagnosis Date   ??? Anxiety    ??? Depression    ??? Hypertension    ??? Pancreatitis 2004   ??? Recovering alcoholic (HCC)        Allergies:   No Known Allergies    Surgical History:     Past Surgical History:   Procedure Laterality Date   ??? HX BREAST AUGMENTATION Bilateral 2011   ??? HX BUNIONECTOMY         Social History:     Social History     Socioeconomic History   ??? Marital status: MARRIED     Spouse name: Not on file   ??? Number of children: Not on file   ??? Years of education: Not on file   ??? Highest education level: Not on file   Tobacco Use   ??? Smoking status: Former Smoker   ??? Smokeless tobacco: Current User   ??? Tobacco comment: vape       Review of  Symptoms:  Constitutional: c/o malaise, denies fever or chills  Skin: Negative for rash or lesion  Head: Negative for facial swelling or tenderness  Eyes: Negative for redness or discharge  Ears: Negative for otalgia or decreased hearing  Nose: c/o nasal congestion, denies sinus pressure  Neck: c/o sore throat, denies lymphadenopathy   Cardiovascular: Negative for chest pain or palpitations  Respiratory: c/o non-productive cough, denies wheezing or SOB  Gastrointestinal: Negative for nausea or abdominal pain  Neurologic: Negative for headache or dizziness      Visit Vitals  BP 117/75 (BP 1 Location: Right arm, BP Patient Position: Sitting)   Pulse 63   Temp 97.9 ??F (36.6 ??C) (Oral)   Resp 12   Ht 5' 5.5" (1.664 m)   Wt 144 lb (65.3 kg)   SpO2 98%   BMI 23.60 kg/m??       Physical Examination:  General: Well developed,  well nourished, in no acute distress  Skin: Warm and dry sans rash or lesion  Head: Normocephalic, atraumatic  Eyes: Sclera clear, EOMI, PERRL  Ears: tympanic membranes normal in appearance  Nose: mucosal edema with rhinorrhea  Oropharynx: posterior erythema, no exudate   Neck: Normal range of motion, no lymphadenopathy  Cardiovascular: normal S1, S2, regular rate and rhythm  Respiratory: Clear to auscultation bilaterally with symmetrical, unlabored effort  Abdomen: Soft, Normal BS  Extremities: Full range of motion  Neurologic: Active, alert and oriented      Diagnoses and all orders for this visit:    1. Acute rhinitis  -     predniSONE (DELTASONE) 20 mg tablet; Take 20 mg by mouth two (2) times a day.    2. Cough  -     predniSONE (DELTASONE) 20 mg tablet; Take 20 mg by mouth two (2) times a day.    3. Essential hypertension  -     LIPID PANEL; Future  -     METABOLIC PANEL, COMPREHENSIVE; Future    4. Anxiety    Other orders  -     METABOLIC PANEL, COMPREHENSIVE  -     LIPID PANEL        Symptomatic therapy suggested: rest, increase fluids, gargle prn for sore throat and call prn if symptoms persist  or worsen.  I have discussed the diagnosis with the patient and the intended plan as seen in the above orders.The patient expresses understanding and agreement with our plan of care. All of the patient's questions were answered to apparent satisfaction. The patient has received an after-visit summary. The patient knows to call our office if there are any questions or concerns regarding diagnosis and treatment plans. I have discussed medication side effects and warnings with the patient as well.

## 2018-05-05 NOTE — Progress Notes (Signed)
1. Have you been to the ER, urgent care clinic since your last visit?  Hospitalized since your last visit?No    2. Have you seen or consulted any other health care providers outside of the [facility] since your last visit?  Include any pap smears or colon screening. No  Reviewed record in preparation for visit and have necessary documentation  Pt did not bring medication to office visit for review    Goals that were addressed and/or need to be completed during or after this appointment include     Health Maintenance Due   Topic Date Due   ??? Pneumococcal 0-64 years (1 of 1 - PPSV23) 12/09/1961   ??? DTaP/Tdap/Td series (1 - Tdap) 12/10/1966   ??? Shingrix Vaccine Age 50> (1 of 2) 12/09/2005   ??? FOBT Q1Y Age 50-75  12/09/2005

## 2018-05-05 NOTE — Patient Instructions (Signed)
Upper Respiratory Infection (Cold): Care Instructions  Your Care Instructions    An upper respiratory infection, or URI, is an infection of the nose, sinuses, or throat. URIs are spread by coughs, sneezes, and direct contact. The common cold is the most frequent kind of URI. The flu and sinus infections are other kinds of URIs.  Almost all URIs are caused by viruses. Antibiotics won't cure them. But you can treat most infections with home care. This may include drinking lots of fluids and taking over-the-counter pain medicine. You will probably feel better in 4 to 10 days.  The doctor has checked you carefully, but problems can develop later. If you notice any problems or new symptoms, get medical treatment right away.  Follow-up care is a key part of your treatment and safety. Be sure to make and go to all appointments, and call your doctor if you are having problems. It's also a good idea to know your test results and keep a list of the medicines you take.  How can you care for yourself at home?  ?? To prevent dehydration, drink plenty of fluids, enough so that your urine is light yellow or clear like water. Choose water and other caffeine-free clear liquids until you feel better. If you have kidney, heart, or liver disease and have to limit fluids, talk with your doctor before you increase the amount of fluids you drink.  ?? Take an over-the-counter pain medicine, such as acetaminophen (Tylenol), ibuprofen (Advil, Motrin), or naproxen (Aleve). Read and follow all instructions on the label.  ?? Before you use cough and cold medicines, check the label. These medicines may not be safe for young children or for people with certain health problems.  ?? Be careful when taking over-the-counter cold or flu medicines and Tylenol at the same time. Many of these medicines have acetaminophen, which is Tylenol. Read the labels to make sure that you are not taking  more than the recommended dose. Too much acetaminophen (Tylenol) can be harmful.  ?? Get plenty of rest.  ?? Do not smoke or allow others to smoke around you. If you need help quitting, talk to your doctor about stop-smoking programs and medicines. These can increase your chances of quitting for good.  When should you call for help?  Call 911 anytime you think you may need emergency care. For example, call if:  ?? ?? You have severe trouble breathing.   ??Call your doctor now or seek immediate medical care if:  ?? ?? You seem to be getting much sicker.   ?? ?? You have new or worse trouble breathing.   ?? ?? You have a new or higher fever.   ?? ?? You have a new rash.   ??Watch closely for changes in your health, and be sure to contact your doctor if:  ?? ?? You have a new symptom, such as a sore throat, an earache, or sinus pain.   ?? ?? You cough more deeply or more often, especially if you notice more mucus or a change in the color of your mucus.   ?? ?? You do not get better as expected.   Where can you learn more?  Go to http://www.healthwise.net/[employer].  Enter K520 in the search box to learn more about "Upper Respiratory Infection (Cold): Care Instructions."  Current as of: August 30, 2017  Content Version: 12.2  ?? 2006-2019 Healthwise[employer]. Care instructions adapted under license by [employer] (which disclaims liability or warranty for this   information). If you have questions about a medical condition or this instruction, always ask your healthcare professional. Healthwise, Incorporated disclaims any warranty or liability for your use of this information.

## 2018-05-05 NOTE — Progress Notes (Signed)
Patient: Kristine Banks MRN: [id number]  SSN: [id number]    Date of Birth: 06-May-1955  Age: 63 y.o.  Sex: female      Chief Complaint   Patient presents with   ??? Cough     hoarse, scratchy throat, dizziness     Kristine Banks is a 63 y.o. female new to me who presents with complaints of sore throat, dry cough, hoarseness and dizziness for 1 week.  There has been no nausea and no vomiting . she has not had  swollen glands, myalgias and fever. Symptoms are moderate. Patient is drinking plenty of fluids. There is not a hx of asthma. There is a hx of allergic rhinitis. There is a hx of tobacco use. Patient with hx of HTN and anxiety. BP well controlled. Mood stable. She is due for lab work.    Medications:     Current Outpatient Medications   Medication Sig   ??? predniSONE (DELTASONE) 20 mg tablet Take 20 mg by mouth two (2) times a day.   ??? levocetirizine (XYZAL) 5 mg tablet TAKE 1/2 TO 1 TABLET BY MOUTH EVERY DAY AS NEEDED   ??? venlafaxine-SR (EFFEXOR-XR) 37.5 mg capsule Take 1 Cap by mouth daily.   ??? lisinopril (PRINIVIL, ZESTRIL) 10 mg tablet TAKE ONE TABLET BY MOUTH EVERY DAY     No current facility-administered medications for this visit.        Problem List:   There are no active problems to display for this patient.      Medical History:     Past Medical History:   Diagnosis Date   ??? Anxiety    ??? Depression    ??? Hypertension    ??? Pancreatitis 2004   ??? Recovering alcoholic (HCC)        Allergies:   No Known Allergies    Surgical History:     Past Surgical History:   Procedure Laterality Date   ??? HX BREAST AUGMENTATION Bilateral 2011   ??? HX BUNIONECTOMY         Social History:     Social History     Socioeconomic History   ??? Marital status: MARRIED     Spouse name: Not on file   ??? Number of children: Not on file   ??? Years of education: Not on file   ??? Highest education level: Not on file   Tobacco Use   ??? Smoking status: Former Smoker   ??? Smokeless tobacco: Current User   ??? Tobacco comment: vape        Review of Symptoms:  Constitutional: c/o malaise, denies fever or chills  Skin: Negative for rash or lesion  Head: Negative for facial swelling or tenderness  Eyes: Negative for redness or discharge  Ears: Negative for otalgia or decreased hearing  Nose: c/o nasal congestion, denies sinus pressure  Neck: c/o sore throat, denies lymphadenopathy   Cardiovascular: Negative for chest pain or palpitations  Respiratory: c/o non-productive cough, denies wheezing or SOB  Gastrointestinal: Negative for nausea or abdominal pain  Neurologic: Negative for headache or dizziness      Visit Vitals  BP 117/75 (BP 1 Location: Right arm, BP Patient Position: Sitting)   Pulse 63   Temp 97.9 ??F (36.6 ??C) (Oral)   Resp 12   Ht 5' 5.5" (1.664 m)   Wt 144 lb (65.3 kg)   SpO2 98%   BMI 23.60 kg/m??       Physical Examination:  General: Well developed,  well nourished, in no acute distress  Skin: Warm and dry sans rash or lesion  Head: Normocephalic, atraumatic  Eyes: Sclera clear, EOMI, PERRL  Ears: tympanic membranes normal in appearance  Nose: mucosal edema with rhinorrhea  Oropharynx: posterior erythema, no exudate   Neck: Normal range of motion, no lymphadenopathy  Cardiovascular: normal S1, S2, regular rate and rhythm  Respiratory: Clear to auscultation bilaterally with symmetrical, unlabored effort  Abdomen: Soft, Normal BS  Extremities: Full range of motion  Neurologic: Active, alert and oriented      Diagnoses and all orders for this visit:    1. Acute rhinitis  -     predniSONE (DELTASONE) 20 mg tablet; Take 20 mg by mouth two (2) times a day.    2. Cough  -     predniSONE (DELTASONE) 20 mg tablet; Take 20 mg by mouth two (2) times a day.    3. Essential hypertension  -     LIPID PANEL; Future  -     METABOLIC PANEL, COMPREHENSIVE; Future    4. Anxiety    Other orders  -     METABOLIC PANEL, COMPREHENSIVE  -     LIPID PANEL        Symptomatic therapy suggested: rest, increase fluids, gargle prn for sore  throat and call prn if symptoms persist or worsen.  I have discussed the diagnosis with the patient and the intended plan as seen in the above orders.The patient expresses understanding and agreement with our plan of care. All of the patient's questions were answered to apparent satisfaction. The patient has received an after-visit summary. The patient knows to call our office if there are any questions or concerns regarding diagnosis and treatment plans. I have discussed medication side effects and warnings with the patient as well.

## 2018-05-07 LAB — LIPID PANEL
Cholesterol, Total: 250 mg/dL — ABNORMAL HIGH (ref 100–199)
Cholesterol, total: 250 mg/dL — ABNORMAL HIGH (ref 100–199)
HDL Cholesterol: 76 mg/dL (ref >39)
HDL: 76 mg/dL (ref >39)
LDL Calculated: 153 mg/dL — ABNORMAL HIGH (ref 0–99)
LDL, calculated: 153 mg/dL — ABNORMAL HIGH (ref 0–99)
Triglyceride: 103 mg/dL (ref 0–149)
Triglycerides: 103 mg/dL (ref 0–149)
VLDL Cholesterol Calculated: 21 mg/dL (ref 5–40)
VLDL, calculated: 21 mg/dL (ref 5–40)

## 2018-05-07 LAB — METABOLIC PANEL, COMPREHENSIVE
A-G Ratio: 1.8 (ref 1.2–2.2)
ALT (SGPT): 12 IU/L (ref 0–32)
AST (SGOT): 14 IU/L (ref 0–40)
Albumin: 4.6 g/dL (ref 3.8–4.8)
Alk. phosphatase: 71 IU/L (ref 39–117)
BUN/Creatinine ratio: 15 (ref 12–28)
BUN: 15 mg/dL (ref 8–27)
Bilirubin, total: 0.3 mg/dL (ref 0.0–1.2)
CO2: 28 mmol/L (ref 20–29)
Calcium: 9.4 mg/dL (ref 8.7–10.3)
Chloride: 102 mmol/L (ref 96–106)
Creatinine: 1 mg/dL (ref 0.57–1.00)
GFR est AA: 70 mL/min/{1.73_m2} (ref >59)
GFR est non-AA: 61 mL/min/{1.73_m2} (ref >59)
GLOBULIN, TOTAL: 2.6 g/dL (ref 1.5–4.5)
Glucose: 96 mg/dL (ref 65–99)
Potassium: 5 mmol/L (ref 3.5–5.2)
Protein, total: 7.2 g/dL (ref 6.0–8.5)
Sodium: 141 mmol/L (ref 134–144)

## 2018-05-07 LAB — COMPREHENSIVE METABOLIC PANEL
ALT: 12 IU/L (ref 0–32)
AST: 14 IU/L (ref 0–40)
Albumin/Globulin Ratio: 1.8 NA (ref 1.2–2.2)
Albumin: 4.6 g/dL (ref 3.8–4.8)
Alkaline Phosphatase: 71 IU/L (ref 39–117)
BUN/Creatinine Ratio: 15 NA (ref 12–28)
BUN: 15 mg/dL (ref 8–27)
CO2: 28 mmol/L (ref 20–29)
Calcium: 9.4 mg/dL (ref 8.7–10.3)
Chloride: 102 mmol/L (ref 96–106)
Creatinine: 1 mg/dL (ref 0.57–1.00)
GFR African American: 70 mL/min/{1.73_m2} (ref >59)
Globulin, Total: 2.6 g/dL (ref 1.5–4.5)
Glucose: 96 mg/dL (ref 65–99)
Potassium: 5 mmol/L (ref 3.5–5.2)
Sodium: 141 mmol/L (ref 134–144)
Total Bilirubin: 0.3 mg/dL (ref 0.0–1.2)
Total Protein: 7.2 g/dL (ref 6.0–8.5)
eGFR NON-AA: 61 mL/min/{1.73_m2} (ref >59)

## 2018-08-23 MED ORDER — LEVOCETIRIZINE 5 MG TAB
5 mg | ORAL_TABLET | ORAL | 1 refills | Status: DC
Start: 2018-08-23 — End: 2019-07-11

## 2018-09-30 ENCOUNTER — Telehealth

## 2018-10-01 ENCOUNTER — Telehealth: Attending: Student in an Organized Health Care Education/Training Program | Primary: Family Medicine

## 2018-10-01 ENCOUNTER — Telehealth
Admit: 2018-10-01 | Discharge: 2018-10-01 | Payer: PRIVATE HEALTH INSURANCE | Attending: Student in an Organized Health Care Education/Training Program | Primary: Family Medicine

## 2018-10-01 ENCOUNTER — Encounter

## 2018-10-01 DIAGNOSIS — R197 Diarrhea, unspecified: Secondary | ICD-10-CM

## 2018-10-01 NOTE — Progress Notes (Signed)
Progress  Notes by [name], DO at 10/01/18 1030                Author: [name], DO  Service: --  Author Type: Resident       Filed: 10/01/18 1056  Encounter Date: 10/01/2018  Status: Signed          Editor: [name], DO (Resident)                  [name]   63 y.o. female   1956-03-09   [address]   [place] [address]   [id number]       [facility]:     Telemedicine Progress Note   [name], [place]           Encounter Date and Time: October 01, 2018 at 10:12 AM      Consent:   She and/or the health care decision maker is aware that that she  may receive a bill for this telephone service, depending on her insurance coverage, and has provided verbal consent to proceed:  Yes         No chief complaint on file.      History of Present Illness         Kristine Banks is a 63 y.o.  female was evaluated by synchronous (real-time) audio-video technology from home, through the [facility] Patient Portal.      Patient complains of 10 days of non bloody diarrhea. She has had this issue since she was in her 99s, but usually it only happens once a year and only lasts a few days. This epsiode began the night after a deep tissue massage.  6 episodes of loose stool  daily. Notes some dark stools, non bloody. No fevers, no new foods or raw meat or fish, no antibiotics, no travel, no sick contacts, no recent hospitalizations. Last colonoscopy was 10 years ago. Had some lower abdominal cramping when diarrhea first started,  but no abdominal pain since. Has tried taking Imodium and Pepto Bismol. Staying well hydrated.   Had an appointment with GI in [place] last year and was given a colonoscopy prep kit and was told she should have a colonoscopy while she is symptomatic,  but was never symptomatic again until she moved to [place] this year. Notes she's been under some increased stress/anxiety recently.      No cough, congestion, shortness of breath. No COVID+ contacts.       Review of Systems         Review of Systems    Constitutional: Negative for chills, fever, malaise/fatigue and weight loss.    HENT: Negative for congestion, hearing loss, sinus pain and sore throat.     Eyes: Negative for blurred vision, double vision and pain.    Respiratory: Negative for cough, sputum production, shortness of breath and wheezing.     Cardiovascular: Negative for chest pain, palpitations and leg swelling.    Gastrointestinal: Positive for abdominal pain and diarrhea . Negative for blood in stool, heartburn, melena, nausea and vomiting.    Genitourinary: Negative for dysuria and urgency.    Musculoskeletal: Negative for back pain, myalgias and neck pain.    Skin: Negative for rash.    Neurological: Negative for dizziness, weakness and headaches.    Endo/Heme/Allergies: Does not bruise/bleed easily.    Psychiatric/Behavioral: Negative for depression and suicidal ideas.  Vitals/Objective:            General:  alert, cooperative, no distress      Mental  status:  mental status: alert, oriented to person, place, and time, normal mood, behavior, speech, dress, motor activity, and thought processes      Resp:  resp: normal effort and no respiratory distress      Neuro:  neuro: no gross deficits      Skin:  skin: no discoloration or lesions of concern on visible areas         Due to this being a TeleHealth evaluation, many elements of the physical examination are unable to be assessed.         Assessment and Plan:         Time-based coding, delete if not needed: I spent at least 15 minutes with this established patient, and >50% of the time was spent counseling and/or coordinating  care regarding diarrhea      1. Diarrhea, unspecified type: 6 watery BMs daily for the past 10 days. Has had these symptoms in the past (about once a year since she was  20) and was seen by GI in [place] and told to have a colonoscopy while the symptoms are present. No signs of bacterial infection, however  will rule out before colonoscopy. Dark stool likely 2/2 Pepto use, but will check for blood in stool. Patient  is also due for a colonoscopy as her last was 10 years ago. Patient is on vacation in [place] but will be back Monday and will pick up stool studies.    - C. DIFFICILE AG & TOXIN A/B; Future   - ENTERIC BACTERIA PANEL, DNA; Future   - WBC, STOOL; Future   -FIT test   -Referral to GI for colonoscopy/ further workup            Time spent in direct conversation with the patient to include medical condition(s) discussed, assessment and treatment plan:          We discussed the expected course, resolution and complications of the diagnosis(es) in detail.  Medication risks, benefits, costs, interactions, and alternatives were discussed as indicated.  I advised  her to contact the office if her condition worsens, changes or fails to improve as anticipated.  She expressed understanding with the diagnosis(es) and plan. Patient understands that this encounter was a temporary measure, and the importance of further follow up and examination was emphasized.   Patient verbalized understanding.         Patient informed to follow up: prn.      Electronically Signed: [name], DO      Providers location when delivering service (clinic, hospital, home): home      CPT Codes [phone] for Established Patients may apply to this Telehealth Visit.  POS code: 32.  Modifier GT         Pursuant to the emergency declaration under the [place] Act and the [employer], 1135 waiver authority and the [employer] Act,  this Virtual  Visit was conducted, with patient's consent, to reduce the patient's risk of exposure to COVID-19 and provide continuity of care for an established patient.       Services were provided through a video synchronous discussion virtually to substitute for in-person clinic visit.     History         Patients past medical, surgical and  family histories were reviewed and updated.  Past Medical History:      Diagnosis  Date      ?  Anxiety        ?  Depression        ?  Hypertension        ?  Pancreatitis  2004      ?  Recovering alcoholic [facility])              Past Surgical History:      Procedure  Laterality  Date      ?  HX BREAST AUGMENTATION  Bilateral  2011      ?  HX BUNIONECTOMY                Family History      Problem  Relation  Age of Onset      ?  Hypertension  Mother        ?  Cancer  Mother              glioblastoma      ?  Thyroid Disease  Mother              Posey Rea if overactive or underactive      ?  Thyroid Disease  Brother        ?  Hypertension  Brother              Social History            Socioeconomic History      ?  Marital status:  MARRIED          Spouse name:  Not on file      ?  Number of children:  Not on file      ?  Years of education:  Not on file      ?  Highest education level:  Not on file      Occupational History      ?  Not on file      Social Needs      ?  Financial resource strain:  Not on file      ?  Food insecurity          Worry:  Not on file          Inability:  Not on file      ?  Transportation needs          Medical:  Not on file          Non-medical:  Not on file      Tobacco Use      ?  Smoking status:  Former Smoker      ?  Smokeless tobacco:  Current User      ?  Tobacco comment: vape      Substance and Sexual Activity      ?  Alcohol use:  Not on file      ?  Drug use:  Not on file      ?  Sexual activity:  Not on file      Lifestyle      ?  Physical activity          Days per week:  Not on file          Minutes per session:  Not on file      ?  Stress:  Not on file      Relationships      ?  Social connections  Talks on phone:  Not on file          Gets together:  Not on file          Attends religious service:  Not on file          Active member of club or organization:  Not on file          Attends meetings of clubs or organizations:  Not on file          Relationship status:  Not  on file      ?  Intimate partner violence          Fear of current or ex partner:  Not on file          Emotionally abused:  Not on file          Physically abused:  Not on file          Forced sexual activity:  Not on file      Other Topics  Concern      ?  Not on file      Social History Narrative      ?  Not on file            There is no problem list on file for this patient.              Current Medications/Allergies        Medications and Allergies reviewed:      Current Outpatient Medications      Medication  Sig  Dispense  Refill      ?  levocetirizine (XYZAL) 5 mg tablet  TAKE 1/2 TO 1 TABLET BY MOUTH EVERY DAY AS NEEDED  90 Tab  1      ?  predniSONE (DELTASONE) 20 mg tablet  Take 20 mg by mouth two (2) times a day.  10 Tab  0      ?  venlafaxine-SR (EFFEXOR-XR) 37.5 mg capsule  Take 1 Cap by mouth daily.  90 Cap  3      ?  lisinopril (PRINIVIL, ZESTRIL) 10 mg tablet  TAKE ONE TABLET BY MOUTH EVERY DAY  90 Tab  1           No Known Allergies

## 2018-10-01 NOTE — Progress Notes (Signed)
[facility] Attending Addendum:  Dr. Victoria Witt, DO,  the patient and I were not physically present during this encounter.  The resident and I are concurrently monitoring the patient care through appropriate telecommunication technology.  I discussed the findings, assessment and plan with the resident and agree with the resident's findings and plan as documented in the resident's note.      Bow Buntyn, MD

## 2018-10-01 NOTE — Progress Notes (Signed)
[facility] Attending Addendum:  Dr. Victoria Witt, DO,  the patient and I were not physically present during this encounter.  The resident and I are concurrently monitoring the patient care through appropriate telecommunication technology.  I discussed the findings, assessment and plan with the resident and agree with the resident's findings and plan as documented in the resident's note.      Pama Roskos, MD

## 2018-10-01 NOTE — Telephone Encounter (Signed)
Appointment scheduled per E-visit record

## 2018-10-01 NOTE — Progress Notes (Signed)
Kristine Banks  63 y.o. female  1956-03-24  [address]  [place] [address]  [id number]   [place]:    Telemedicine Progress Note  Kristine Banks, [place]       Encounter Date and Time: October 01, 2018 at 10:12 AM    Consent:  She and/or the health care decision maker is aware that that she may receive a bill for this telephone service, depending on her insurance coverage, and has provided verbal consent to proceed: Yes    No chief complaint on file.    History of Present Illness   [name] is a 63 y.o. female was evaluated by synchronous (real-time) audio-video technology from home, through the [facility] [facility] Patient Portal.    Patient complains of 10 days of non bloody diarrhea. She has had this issue since she was in her 24s, but usually it only happens once a year and only lasts a few days. This epsiode began the night after a deep tissue massage.  6 episodes of loose stool daily. Notes some dark stools, non bloody. No fevers, no new foods or raw meat or fish, no antibiotics, no travel, no sick contacts, no recent hospitalizations. Last colonoscopy was 10 years ago. Had some lower abdominal cramping when diarrhea first started, but no abdominal pain since. Has tried taking Imodium and Pepto Bismol. Staying well hydrated.   Had an appointment with GI in [place] last year and was given a colonoscopy prep kit and was told she should have a colonoscopy while she is symptomatic, but was never symptomatic again until she moved to [place] this year. Notes she's been under some increased stress/anxiety recently.    No cough, congestion, shortness of breath. No COVID+ contacts.    Review of Systems   Review of Systems   Constitutional: Negative for chills, fever, malaise/fatigue and weight loss.   HENT: Negative for congestion, hearing loss, sinus pain and sore throat.    Eyes: Negative for blurred vision, double vision and pain.    Respiratory: Negative for cough, sputum production, shortness of breath and wheezing.    Cardiovascular: Negative for chest pain, palpitations and leg swelling.   Gastrointestinal: Positive for abdominal pain and diarrhea. Negative for blood in stool, heartburn, melena, nausea and vomiting.   Genitourinary: Negative for dysuria and urgency.   Musculoskeletal: Negative for back pain, myalgias and neck pain.   Skin: Negative for rash.   Neurological: Negative for dizziness, weakness and headaches.   Endo/Heme/Allergies: Does not bruise/bleed easily.   Psychiatric/Behavioral: Negative for depression and suicidal ideas.       Vitals/Objective:     General: alert, cooperative, no distress   Mental  status: mental status: alert, oriented to person, place, and time, normal mood, behavior, speech, dress, motor activity, and thought processes   Resp: resp: normal effort and no respiratory distress   Neuro: neuro: no gross deficits   Skin: skin: no discoloration or lesions of concern on visible areas   Due to this being a TeleHealth evaluation, many elements of the physical examination are unable to be assessed.      Assessment and Plan:   Time-based coding, delete if not needed: I spent at least 15 minutes with this established patient, and >50% of the time was spent counseling and/or coordinating care regarding diarrhea    1. Diarrhea, unspecified type: 6 watery BMs daily for the past 10 days. Has had these symptoms in the past (about once  a year since she was 73) and was seen by GI in [place] and told to have a colonoscopy while the symptoms are present. No signs of bacterial infection, however will rule out before colonoscopy. Dark stool likely 2/2 Pepto use, but will check for blood in stool. Patient is also due for a colonoscopy as her last was 10 years ago. Patient is on vacation in [place] but will be back Monday and will pick up stool studies.   - C. DIFFICILE AG & TOXIN A/B; Future   - ENTERIC BACTERIA PANEL, DNA; Future  - WBC, STOOL; Future  -FIT test  -Referral to GI for colonoscopy/ further workup        Time spent in direct conversation with the patient to include medical condition(s) discussed, assessment and treatment plan:       We discussed the expected course, resolution and complications of the diagnosis(es) in detail.  Medication risks, benefits, costs, interactions, and alternatives were discussed as indicated.  I advised her to contact the office if her condition worsens, changes or fails to improve as anticipated. She expressed understanding with the diagnosis(es) and plan. Patient understands that this encounter was a temporary measure, and the importance of further follow up and examination was emphasized.  Patient verbalized understanding.       Patient informed to follow up: prn.    Electronically Signed: [name], DO    Providers location when delivering service (clinic, hospital, home): home    CPT Codes [id number] for Established Patients may apply to this Telehealth Visit.  POS code: 74.  Modifier GT      Pursuant to the emergency declaration under the [place], 1135 waiver authority and the [employer] Act, this Virtual  Visit was conducted, with patient's consent, to reduce the patient's risk of exposure to COVID-19 and provide continuity of care for an established patient.     Services were provided through a video synchronous discussion virtually to substitute for in-person clinic visit.   History   Patients past medical, surgical and family histories were reviewed and updated.      Past Medical History:   Diagnosis Date   ??? Anxiety    ??? Depression    ??? Hypertension    ??? Pancreatitis 2004   ??? Recovering alcoholic ([place])      Past Surgical History:   Procedure Laterality Date   ??? HX BREAST AUGMENTATION Bilateral 2011   ??? HX BUNIONECTOMY       Family History    Problem Relation Age of Onset   ??? Hypertension Mother    ??? Cancer Mother         glioblastoma   ??? Thyroid Disease Mother         Unsure if overactive or underactive   ??? Thyroid Disease Brother    ??? Hypertension Brother      Social History     Socioeconomic History   ??? Marital status: MARRIED     Spouse name: Not on file   ??? Number of children: Not on file   ??? Years of education: Not on file   ??? Highest education level: Not on file   Occupational History   ??? Not on file   Social Needs   ??? Financial resource strain: Not on file   ??? Food insecurity     Worry: Not on file     Inability: Not on file   ???  Transportation needs     Medical: Not on file     Non-medical: Not on file   Tobacco Use   ??? Smoking status: Former Smoker   ??? Smokeless tobacco: Current User   ??? Tobacco comment: vape   Substance and Sexual Activity   ??? Alcohol use: Not on file   ??? Drug use: Not on file   ??? Sexual activity: Not on file   Lifestyle   ??? Physical activity     Days per week: Not on file     Minutes per session: Not on file   ??? Stress: Not on file   Relationships   ??? Social Product manager on phone: Not on file     Gets together: Not on file     Attends religious service: Not on file     Active member of club or organization: Not on file     Attends meetings of clubs or organizations: Not on file     Relationship status: Not on file   ??? Intimate partner violence     Fear of current or ex partner: Not on file     Emotionally abused: Not on file     Physically abused: Not on file     Forced sexual activity: Not on file   Other Topics Concern   ??? Not on file   Social History Narrative   ??? Not on file     There is no problem list on file for this patient.         Current Medications/Allergies   Medications and Allergies reviewed:    Current Outpatient Medications   Medication Sig Dispense Refill   ??? levocetirizine (XYZAL) 5 mg tablet TAKE 1/2 TO 1 TABLET BY MOUTH EVERY DAY AS NEEDED 90 Tab 1    ??? predniSONE (DELTASONE) 20 mg tablet Take 20 mg by mouth two (2) times a day. 10 Tab 0   ??? venlafaxine-SR (EFFEXOR-XR) 37.5 mg capsule Take 1 Cap by mouth daily. 90 Cap 3   ??? lisinopril (PRINIVIL, ZESTRIL) 10 mg tablet TAKE ONE TABLET BY MOUTH EVERY DAY 90 Tab 1     No Known Allergies

## 2018-10-06 NOTE — Telephone Encounter (Signed)
Spoke with pt   She requests message be sent to Dr Sanda Klein  She reports being tested at [facility] in [place]- results pending  Prescribed zofran which is helping but continues with loose stools (described as mucous like)  Offered her a virtual appt - she declined  Offered her to return to office for labs written by Dr Marlana Salvage, Dx: Diarrhea  She claims that she was no longer to have those labs      Please advise

## 2018-10-06 NOTE — Telephone Encounter (Signed)
-----   Message from Karin Lieu sent at 10/06/2018 10:53 AM EDT -----  Regarding: FW: Witt/telephone    ----- Message -----  From: Marcy Salvo  Sent: 10/06/2018  10:00 AM EDT  To: [facility] Office Pool  Subject: Witt/telephone                                   Pt is requesting for you to call her in regard to her still having diarrhea.Pts number is  [phone].

## 2018-10-08 NOTE — Telephone Encounter (Signed)
MyChart message sent to patient by Dr. Sanda Klein

## 2018-10-14 NOTE — Telephone Encounter (Signed)
Spoke with pt   She has not sent in stool sample as of today for ordered tests  She continues with frequent stools.  Suggested she bring in samples   She agreed

## 2018-10-14 NOTE — Telephone Encounter (Signed)
Cousins, Torie L  P [facility] [employer]       ??       Patient is checking the status of her stool sample, pls contact pt at [phone]

## 2018-10-15 ENCOUNTER — Encounter: Primary: Family Medicine

## 2018-10-15 ENCOUNTER — Inpatient Hospital Stay: Admit: 2018-10-15 | Payer: Self-pay | Primary: Family Medicine

## 2018-10-16 LAB — C. DIFFICILE AG & TOXIN A/B
C. DIFFICILE TOXIN: NEGATIVE
C. difficile toxin: NEGATIVE
GDH ANTIGEN: NEGATIVE
GDH Antigen: NEGATIVE
INTERPRETATION: NEGATIVE
INTERPRETATION: NEGATIVE

## 2018-10-16 LAB — ENTERIC BACTERIA PANEL, DNA
CAMPYLOBACTER SPECIES, DNA: NEGATIVE
Campylobacter species, DNA: NEGATIVE
ENTEROTOXIGEN E COLI, DNA: NEGATIVE
Enterotoxigen E Coli, DNA: NEGATIVE
P. SHIGELLOIDES, DNA: NEGATIVE
P. shigelloides, DNA: NEGATIVE
SALMONELLA SPECIES, DNA: NEGATIVE
SHIGA TOXIN PRODUCING, DNA: NEGATIVE
SHIGELLA SPECIES, DNA: NEGATIVE
Salmonella species, DNA: NEGATIVE
Shiga toxin producing, DNA: NEGATIVE
Shigella species, DNA: NEGATIVE
VIBRIO SPECIES, DNA: NEGATIVE
Vibrio species, DNA: NEGATIVE
Y. ENTEROCOLITICA, DNA: NEGATIVE
Y. enterocolitica, DNA: NEGATIVE

## 2018-10-19 ENCOUNTER — Encounter

## 2018-10-19 NOTE — Telephone Encounter (Addendum)
Called pt back per her request. Explained I was out of the office last week. Discussed normal lab results with no evidence of bacteria in the stool. The FIT has been mailed off but hasn't resulted yet. She continues to have diarrhea after she eats, watery stools 1-4 times after eating. Sometimes associated with fecal leakage. It does wake her up at night. No frank blood.     Chart review shows that referral information was sent on 7/22. Called Dr. [facility] and [facility], however they don't have appts available for another month. [facility] is able to see her tomorrow at 10AM for virtual visit.    Called the lab. They hold stool samples for only 3 days so we will not be able to add on O&P to her sample. Advised pt it is unlikely that she has a parasite, but if GI wants this test done we can certainly order it.    MyChart message with above info sent to pt and she had been advised to be waiting for this message.

## 2018-10-19 NOTE — Telephone Encounter (Signed)
From: Eugene Garnet, MD  To: [name]  Sent: 10/07/2018 8:43 AM EDT  Subject: Re: diarrhea    Hi Ms. Kristine Banks,  I received your message. I apologize if this was communicated incorrectly, but our intention for you had been to get the COVID test first. If the test is negative, you should come in to the lab to pick up the stool studies. Those will check for other types of infection that could be causing your symptoms.  We are also working on a referral for you to go see the Gastroenterologist. I will check on the status of that today. In the meantime, it is ok to take some immodium to slow down the diarrhea. If you feel like you are getting dehydrated, weak, lightheaded, or dizzy, I would recommend you go to the ER for further evaluation. Please let me know if you have any other questions and I hope we have an answer for you soon,  Dr. Sanda Klein

## 2018-10-21 LAB — OCCULT BLOOD IMMUNOASSAY,DIAGNOSTIC: Occult blood fecal, by IA: NEGATIVE

## 2018-10-21 LAB — OCCULT BLOOD DIAGNOSTIC: OCCULT BLOOD, FECAL, IA: NEGATIVE

## 2018-10-22 ENCOUNTER — Inpatient Hospital Stay: Admit: 2018-10-22 | Payer: Self-pay | Primary: Family Medicine

## 2018-10-22 NOTE — Telephone Encounter (Signed)
Records received from pt's GI appt with labs they want her to get. Pt states she hasn't gotten the collection kit yet and would prefer to have them done here for convenience. Will order labs and fax results to [facility].

## 2018-10-23 LAB — ENTERIC BACTERIA PANEL, DNA
CAMPYLOBACTER SPECIES, DNA: NEGATIVE
Campylobacter species, DNA: NEGATIVE
ENTEROTOXIGEN E COLI, DNA: NEGATIVE
Enterotoxigen E Coli, DNA: NEGATIVE
P. SHIGELLOIDES, DNA: NEGATIVE
P. shigelloides, DNA: NEGATIVE
SALMONELLA SPECIES, DNA: NEGATIVE
SHIGA TOXIN PRODUCING, DNA: NEGATIVE
SHIGELLA SPECIES, DNA: NEGATIVE
Salmonella species, DNA: NEGATIVE
Shiga toxin producing, DNA: NEGATIVE
Shigella species, DNA: NEGATIVE
VIBRIO SPECIES, DNA: NEGATIVE
Vibrio species, DNA: NEGATIVE
Y. ENTEROCOLITICA, DNA: NEGATIVE
Y. enterocolitica, DNA: NEGATIVE

## 2018-10-25 ENCOUNTER — Encounter

## 2018-10-26 MED ORDER — VENLAFAXINE SR 37.5 MG 24 HR CAP
37.5 mg | ORAL_CAPSULE | Freq: Every day | ORAL | 3 refills | Status: DC
Start: 2018-10-26 — End: 2019-10-31

## 2018-10-28 ENCOUNTER — Encounter

## 2018-10-28 LAB — OVA & PARASITES, STOOL

## 2018-10-28 LAB — OVA & PARASITES, STOOL, REFLEX

## 2018-10-28 LAB — OVA AND PARASITE SCREEN

## 2018-10-29 MED ORDER — DICYCLOMINE 10 MG CAP
10 mg | ORAL_CAPSULE | Freq: Four times a day (QID) | ORAL | 1 refills | Status: DC
Start: 2018-10-29 — End: 2019-07-11

## 2018-11-01 LAB — CALPROTECTIN, FECAL
CALPROTECTIN, FECAL: 474 ug/g — ABNORMAL HIGH (ref 0–120)
Calprotectin, Fecal: 474 ug/g — ABNORMAL HIGH (ref 0–120)

## 2018-11-10 NOTE — Telephone Encounter (Signed)
mychart message sent by MD to notify pt of faxed records

## 2018-11-10 NOTE — Telephone Encounter (Signed)
PT STATES SHE WOULD LIKE THE TEST RESULTS OF ALL STOOL SAMPLES SENT OVER TO THE GASTROENTEROLOGIST. PLEASE CALL PT WHEN FAXED OVER PLEASE

## 2018-11-27 ENCOUNTER — Encounter

## 2018-11-30 MED ORDER — LISINOPRIL 10 MG TAB
10 mg | ORAL_TABLET | ORAL | 0 refills | Status: DC
Start: 2018-11-30 — End: 2019-05-25

## 2018-11-30 NOTE — Telephone Encounter (Signed)
Could we get her a virtual visit to follow-up chronic conditions for next month? Thanks!

## 2018-11-30 NOTE — Telephone Encounter (Signed)
Call made to pt. Appt scheduled

## 2018-12-27 ENCOUNTER — Telehealth: Attending: Family Medicine | Primary: Family Medicine

## 2018-12-27 ENCOUNTER — Telehealth: Admit: 2018-12-27 | Discharge: 2018-12-27 | Payer: MEDICAID | Attending: Family Medicine | Primary: Family Medicine

## 2018-12-27 DIAGNOSIS — K52832 Lymphocytic colitis: Secondary | ICD-10-CM

## 2018-12-27 NOTE — Progress Notes (Signed)
Kristine Banks is a 63 y.o. female who was seen by synchronous (real-time) Administrator, arts.     Consent:  Patient and/or their healthcare decision maker is aware that this patient-initiated Telehealth encounter is a billable service, with coverage as determined by their insurance carrier. They are aware that they may receive a bill and have provided verbal consent to proceed: Yes    I was in the office while conducting this encounter.    Platform: [email or web address]    HPI: Pt is a 63 y.o. female who presents for f/u HTN and colitis. She reports that she has been taking mesalamine for a few months and her diarrhea has resolved. She is feeling a lot better. She had lost 10lbs while she was having the diarrhea and thinks she has gained some back. She is able to eat regularly now.    HTN:  Checking BPs at home?: NO  Adding salt to foods?: NO  Headaches?: NO  Blurry vision?: NO  Lower extremity edema?: NO  Smoking?: NO        Past Medical History:   Diagnosis Date   ??? Anxiety    ??? Depression    ??? Hypertension    ??? Pancreatitis 2004   ??? Recovering alcoholic ([facility])        Family History   Problem Relation Age of Onset   ??? Hypertension Mother    ??? Cancer Mother         glioblastoma   ??? Thyroid Disease Mother         Unsure if overactive or underactive   ??? Thyroid Disease Brother    ??? Hypertension Brother        Social History     Tobacco Use   ??? Smoking status: Former Smoker   ??? Smokeless tobacco: Never Used   ??? Tobacco comment: vape   Substance Use Topics   ??? Alcohol use: Not on file   ??? Drug use: Not on file       ROS:  Per HPI    PE:  There were no vitals taken for this visit.  Gen: Pt in NAD  Head: Normocephalic, atraumatic  Eyes: Sclera anicteric, EOM grossly intact  Throat: MMM,  Neck: Supple  Resp: Speaking easily in full sentences without respiratory distress.   Neuro: Alert, oriented, appropriate      A/P:   Encounter Diagnoses     ICD-10-CM ICD-9-CM   1. Lymphocytic colitis  K52.832 558.9   2. Essential hypertension   I10 401.9   3. Mixed hyperlipidemia  E78.2 272.2     1. Lymphocytic colitis  - Will request records from GI with latest notes  - mesalamine ER (APRISO) 0.375 gram 24 hour capsule; Take 4 Caps by mouth daily.  Dispense: 1 Cap; Refill: 0    2. Essential hypertension: Pt to start taking BP's at home again and will send them via MyChart.   - METABOLIC PANEL, COMPREHENSIVE; Future    3. Mixed hyperlipidemia: Elevated at last check in 04/2018 with plans to work on diet and exercise and recheck.  - LIPID PANEL; Future       RTC in 6 months for f/u chronic conditions, or sooner prn    Discussed diagnoses in detail with patient.   Medication risks/benefits/side effects discussed with patient.   All of the patient's questions were addressed. The patient understands and agrees with our plan of care.  The patient knows to call back if they are unsure of or  forget any changes we discussed today or if the symptoms change.      Kristine Banks is a 63 y.o. female being evaluated by a video visit encounter for concerns as above.  A caregiver was present when appropriate. Due to this being a Scientist, physiological (During MWUXL-24 public health emergency), evaluation of the following organ systems was limited: Vitals/Constitutional/EENT/Resp/CV/GI/GU/MS/Neuro/Skin/Heme-Lymph-Imm.  Pursuant to the emergency declaration under the [place], 1135 waiver authority and the [employer] Act, this Virtual  Visit was conducted, with patient's (and/or legal guardian's) consent, to reduce the patient's risk of exposure to COVID-19 and provide necessary medical care.     Services were provided through a video synchronous discussion virtually to substitute for in-person clinic visit.   Patient and provider were located in their home and in the office, respectively.        Current Outpatient Medications on File Prior to Visit   Medication Sig Dispense Refill    ??? mesalamine ER (APRISO) 0.375 gram 24 hour capsule Take 4 Caps by mouth daily. 1 Cap 0   ??? lisinopriL (PRINIVIL, ZESTRIL) 10 mg tablet TAKE ONE TABLET BY MOUTH EVERY DAY 90 Tab 0   ??? dicyclomine (BENTYL) 10 mg capsule Take 2 Caps by mouth four (4) times daily. 240 Cap 1   ??? venlafaxine-SR (EFFEXOR-XR) 37.5 mg capsule Take 1 Cap by mouth daily. 90 Cap 3   ??? levocetirizine (XYZAL) 5 mg tablet TAKE 1/2 TO 1 TABLET BY MOUTH EVERY DAY AS NEEDED 90 Tab 1     No current facility-administered medications on file prior to visit.

## 2018-12-27 NOTE — Progress Notes (Signed)
Kristine Banks is a 63 y.o. female who was seen by synchronous (real-time) Administrator, arts.     Consent:  Patient and/or their healthcare decision maker is aware that this patient-initiated Telehealth encounter is a billable service, with coverage as determined by their insurance carrier. They are aware that they may receive a bill and have provided verbal consent to proceed: Yes    I was in the office while conducting this encounter.    Platform: [email or web address]    HPI: Pt is a 63 y.o. female who presents for f/u HTN and colitis. She reports that she has been taking mesalamine for a few months and her diarrhea has resolved. She is feeling a lot better. She had lost 10lbs while she was having the diarrhea and thinks she has gained some back. She is able to eat regularly now.    HTN:  Checking BPs at home?: NO  Adding salt to foods?: NO  Headaches?: NO  Blurry vision?: NO  Lower extremity edema?: NO  Smoking?: NO        Past Medical History:   Diagnosis Date   ??? Anxiety    ??? Depression    ??? Hypertension    ??? Pancreatitis 2004   ??? Recovering alcoholic ([facility])        Family History   Problem Relation Age of Onset   ??? Hypertension Mother    ??? Cancer Mother         glioblastoma   ??? Thyroid Disease Mother         Unsure if overactive or underactive   ??? Thyroid Disease Brother    ??? Hypertension Brother        Social History     Tobacco Use   ??? Smoking status: Former Smoker   ??? Smokeless tobacco: Never Used   ??? Tobacco comment: vape   Substance Use Topics   ??? Alcohol use: Not on file   ??? Drug use: Not on file       ROS:  Per HPI    PE:  There were no vitals taken for this visit.  Gen: Pt in NAD  Head: Normocephalic, atraumatic  Eyes: Sclera anicteric, EOM grossly intact  Throat: MMM,  Neck: Supple  Resp: Speaking easily in full sentences without respiratory distress.   Neuro: Alert, oriented, appropriate      A/P:   Encounter Diagnoses     ICD-10-CM ICD-9-CM   1. Lymphocytic colitis  K52.832 558.9    2. Essential hypertension  I10 401.9   3. Mixed hyperlipidemia  E78.2 272.2     1. Lymphocytic colitis  - Will request records from GI with latest notes  - mesalamine ER (APRISO) 0.375 gram 24 hour capsule; Take 4 Caps by mouth daily.  Dispense: 1 Cap; Refill: 0    2. Essential hypertension: Pt to start taking BP's at home again and will send them via MyChart.   - METABOLIC PANEL, COMPREHENSIVE; Future    3. Mixed hyperlipidemia: Elevated at last check in 04/2018 with plans to work on diet and exercise and recheck.  - LIPID PANEL; Future       RTC in 6 months for f/u chronic conditions, or sooner prn    Discussed diagnoses in detail with patient.   Medication risks/benefits/side effects discussed with patient.   All of the patient's questions were addressed. The patient understands and agrees with our plan of care.  The patient knows to call back if they are unsure of or  forget any changes we discussed today or if the symptoms change.      Kristine Banks is a 63 y.o. female being evaluated by a video visit encounter for concerns as above.  A caregiver was present when appropriate. Due to this being a Scientist, physiological (During ZOXWR-60 public health emergency), evaluation of the following organ systems was limited: Vitals/Constitutional/EENT/Resp/CV/GI/GU/MS/Neuro/Skin/Heme-Lymph-Imm.  Pursuant to the emergency declaration under the [place], 1135 waiver authority and the [employer] Act, this Virtual  Visit was conducted, with patient's (and/or legal guardian's) consent, to reduce the patient's risk of exposure to COVID-19 and provide necessary medical care.     Services were provided through a video synchronous discussion virtually to substitute for in-person clinic visit.   Patient and provider were located in their home and in the office, respectively.        Current Outpatient Medications on File Prior to Visit    Medication Sig Dispense Refill   ??? mesalamine ER (APRISO) 0.375 gram 24 hour capsule Take 4 Caps by mouth daily. 1 Cap 0   ??? lisinopriL (PRINIVIL, ZESTRIL) 10 mg tablet TAKE ONE TABLET BY MOUTH EVERY DAY 90 Tab 0   ??? dicyclomine (BENTYL) 10 mg capsule Take 2 Caps by mouth four (4) times daily. 240 Cap 1   ??? venlafaxine-SR (EFFEXOR-XR) 37.5 mg capsule Take 1 Cap by mouth daily. 90 Cap 3   ??? levocetirizine (XYZAL) 5 mg tablet TAKE 1/2 TO 1 TABLET BY MOUTH EVERY DAY AS NEEDED 90 Tab 1     No current facility-administered medications on file prior to visit.

## 2019-01-12 ENCOUNTER — Institutional Professional Consult (permissible substitution): Attending: Family Medicine | Primary: Family Medicine

## 2019-01-12 ENCOUNTER — Ambulatory Visit: Admit: 2019-01-12 | Discharge: 2019-01-12 | Payer: MEDICAID | Primary: Family Medicine

## 2019-01-12 ENCOUNTER — Institutional Professional Consult (permissible substitution): Admit: 2019-01-12 | Discharge: 2019-01-12 | Payer: MEDICAID | Attending: Family Medicine | Primary: Family Medicine

## 2019-01-12 DIAGNOSIS — Z23 Encounter for immunization: Secondary | ICD-10-CM

## 2019-01-12 DIAGNOSIS — E782 Mixed hyperlipidemia: Secondary | ICD-10-CM

## 2019-01-12 NOTE — Progress Notes (Signed)
Patient here for flu shot only. Denies any recent illness. VIS provided.

## 2019-01-12 NOTE — Progress Notes (Signed)
Patient here for flu shot only. Denies any recent illness. VIS provided.

## 2019-01-13 LAB — LIPID PANEL
CHOL/HDL Ratio: 2.8 (ref 0.0–5.0)
Chol/HDL Ratio: 2.8 (ref 0.0–5.0)
Cholesterol, Total: 236 MG/DL — ABNORMAL HIGH (ref <200)
Cholesterol, total: 236 MG/DL — ABNORMAL HIGH (ref <200)
HDL Cholesterol: 83 MG/DL
HDL: 83 MG/DL
LDL Calculated: 139.2 MG/DL — ABNORMAL HIGH (ref 0–100)
LDL, calculated: 139.2 MG/DL — ABNORMAL HIGH (ref 0–100)
Triglyceride: 69 MG/DL (ref <150)
Triglycerides: 69 MG/DL (ref <150)
VLDL Cholesterol Calculated: 13.8 MG/DL
VLDL, calculated: 13.8 MG/DL

## 2019-01-13 LAB — METABOLIC PANEL, COMPREHENSIVE
A-G Ratio: 1.1 (ref 1.1–2.2)
ALT (SGPT): 19 U/L (ref 12–78)
AST (SGOT): 10 U/L — ABNORMAL LOW (ref 15–37)
Albumin: 3.8 g/dL (ref 3.5–5.0)
Alk. phosphatase: 65 U/L (ref 45–117)
Anion gap: 7 mmol/L (ref 5–15)
BUN/Creatinine ratio: 20 (ref 12–20)
BUN: 18 MG/DL (ref 6–20)
Bilirubin, total: 0.4 MG/DL (ref 0.2–1.0)
CO2: 28 mmol/L (ref 21–32)
Calcium: 9.2 MG/DL (ref 8.5–10.1)
Chloride: 105 mmol/L (ref 97–108)
Creatinine: 0.88 MG/DL (ref 0.55–1.02)
GFR est AA: >60 mL/min/{1.73_m2} (ref >60)
GFR est non-AA: >60 mL/min/{1.73_m2} (ref >60)
Globulin: 3.4 g/dL (ref 2.0–4.0)
Glucose: 90 mg/dL (ref 65–100)
Potassium: 4.8 mmol/L (ref 3.5–5.1)
Protein, total: 7.2 g/dL (ref 6.4–8.2)
Sodium: 140 mmol/L (ref 136–145)

## 2019-01-13 LAB — COMPREHENSIVE METABOLIC PANEL
ALT: 19 U/L (ref 12–78)
AST: 10 U/L — ABNORMAL LOW (ref 15–37)
Albumin/Globulin Ratio: 1.1 (ref 1.1–2.2)
Albumin: 3.8 g/dL (ref 3.5–5.0)
Alkaline Phosphatase: 65 U/L (ref 45–117)
Anion Gap: 7 mmol/L (ref 5–15)
BUN: 18 MG/DL (ref 6–20)
Bun/Cre Ratio: 20 (ref 12–20)
CO2: 28 mmol/L (ref 21–32)
Calcium: 9.2 MG/DL (ref 8.5–10.1)
Chloride: 105 mmol/L (ref 97–108)
Creatinine: 0.88 MG/DL (ref 0.55–1.02)
EGFR IF NonAfrican American: >60 mL/min/{1.73_m2} (ref >60)
GFR African American: >60 mL/min/{1.73_m2} (ref >60)
Globulin: 3.4 g/dL (ref 2.0–4.0)
Glucose: 90 mg/dL (ref 65–100)
Potassium: 4.8 mmol/L (ref 3.5–5.1)
Sodium: 140 mmol/L (ref 136–145)
Total Bilirubin: 0.4 MG/DL (ref 0.2–1.0)
Total Protein: 7.2 g/dL (ref 6.4–8.2)

## 2019-02-11 ENCOUNTER — Telehealth: Attending: Student in an Organized Health Care Education/Training Program | Primary: Family Medicine

## 2019-02-11 ENCOUNTER — Telehealth
Admit: 2019-02-11 | Discharge: 2019-02-11 | Payer: MEDICAID | Attending: Student in an Organized Health Care Education/Training Program | Primary: Family Medicine

## 2019-02-11 DIAGNOSIS — H9202 Otalgia, left ear: Secondary | ICD-10-CM

## 2019-02-11 MED ORDER — AMOXICILLIN CLAVULANATE 875 MG-125 MG TAB
875-125 mg | ORAL_TABLET | Freq: Two times a day (BID) | ORAL | 0 refills | Status: DC
Start: 2019-02-11 — End: 2019-12-21

## 2019-02-11 NOTE — Progress Notes (Signed)
[facility] Attending Addendum:  Dr. Victoria Witt, DO,  the patient and I were not physically present during this encounter.  The resident and I are concurrently monitoring the patient care through appropriate telecommunication technology.  I discussed the findings, assessment and plan with the resident and agree with the resident's findings and plan as documented in the resident's note.      Delsy Etzkorn Stephen Layna Roeper, MD

## 2019-02-11 NOTE — Progress Notes (Signed)
Progress  Notes by Kristine Holes, DO at 02/11/19 1600                Author: [name], DO  Service: --  Author Type: Resident       Filed: 02/11/19 1619  Encounter Date: 02/11/2019  Status: Signed          Editor: Kristine Holes, DO (Resident)                  Kristine Banks   63 y.o. female   1955/11/22   [address]   [place] [address]   [phone]       [facility]:     Telemedicine Progress Note   Kristine Banks, Kristine Banks           Encounter Date and Time: February 11, 2019 at 3:37 PM      Consent:   She and/or the health care decision maker is aware that that she  may receive a bill for this telephone service, depending on her insurance coverage, and has provided verbal consent to proceed:  Yes      Chief Complaint      Patient presents with      ?  Ear Pain            History of Present Illness         Kristine Banks is a 63 y.o.  female was evaluated by telephone.      63 y/o female calls complaining of ear pain that radiates towards jaw line on the left side and into teeth that started over a week ago. No facial pain or nasal congestion. Clear rhinorrhea. No fevers, chills, cough, sob, cp. Has been taking Xyzal for  allergies. She has had similar symptoms before when she had a sinus infection. Symptoms have been worsening. Last sinus infection was years ago.       Review of Systems         Review of Systems    Constitutional: Negative for chills, fever and malaise/fatigue.    HENT: Positive for ear pain. Negative for congestion, ear discharge, hearing loss, sinus pain, sore throat and tinnitus.     Eyes: Negative for pain.    Respiratory: Negative for cough, sputum production, shortness of breath and wheezing.     Cardiovascular: Negative for chest pain and leg swelling.    Gastrointestinal: Negative for nausea and vomiting.    Musculoskeletal: Negative for myalgias.    Neurological: Negative for dizziness and headaches.          Vitals/Objective:            General:   alert, cooperative, no distress      Mental  status:  mental status: alert, oriented to person, place, and time, normal mood, behavior, speech, , and thought processes                                 Due to this being a TeleHealth evaluation, many elements of the physical examination are unable to be assessed.         Assessment and Plan:         Time-based coding, delete if not needed: I spent at least 15 minutes with this established patient, and >50% of the time was spent counseling and/or coordinating  care regarding ear pain      1. Left ear  pain: Similar pain that she's had during sinus infections in the past, but she is not having nasal congestion or facial pain. However,  symptoms have been going on for over a week and are worsening while using OTC medications for allergies, so will treat with abx.   - amoxicillin-clavulanate (AUGMENTIN) 875-125 mg per tablet; Take 1 Tab by mouth every twelve (12) hours for 7 days.  Dispense: 14 Tab; Refill: 0   -F/u if still having symptoms after course of augmentin         Time spent in direct conversation with the patient to include medical condition(s) discussed, assessment and treatment plan:          We discussed the expected course, resolution and complications of the diagnosis(es) in detail.  Medication risks, benefits, costs, interactions, and alternatives were discussed as indicated.  I advised  her to contact the office if her condition worsens, changes or fails to improve as anticipated.  She expressed understanding with the diagnosis(es) and plan. Patient understands that this encounter was a temporary measure, and the importance of further follow up and examination was emphasized.   Patient verbalized understanding.         Patient informed to follow up: prn.      Electronically Signed: [name], DO      Providers location when delivering service (clinic, hospital, home): home      CPT Codes [phone] for Established Patients may apply to this Telehealth  Visit.  POS code: 31.  Modifier GT         Pursuant to the emergency declaration under the [address] Act and the [employer], 1135 waiver authority and the [employer] Act,  this Virtual  Visit was conducted, with patient's consent, to reduce the patient's risk of exposure to COVID-19 and provide continuity of care for an established patient.       Services were provided through a video synchronous discussion virtually to substitute for in-person clinic visit.     History         Patients past medical, surgical and family histories were reviewed and updated.        Past Medical History:      Diagnosis  Date      ?  Anxiety        ?  Depression        ?  Hypertension        ?  Pancreatitis  2004      ?  Recovering alcoholic [facility])              Past Surgical History:      Procedure  Laterality  Date      ?  HX BREAST AUGMENTATION  Bilateral  2011      ?  HX BUNIONECTOMY                Family History      Problem  Relation  Age of Onset      ?  Hypertension  Mother        ?  Cancer  Mother              glioblastoma      ?  Thyroid Disease  Mother              Posey Rea if overactive or underactive      ?  Thyroid Disease  Brother        ?  Hypertension  Brother              Social History            Socioeconomic History      ?  Marital status:  MARRIED          Spouse name:  Not on file      ?  Number of children:  Not on file      ?  Years of education:  Not on file      ?  Highest education level:  Not on file      Occupational History      ?  Not on file      Social Needs      ?  Financial resource strain:  Not on file      ?  Food insecurity          Worry:  Not on file          Inability:  Not on file      ?  Transportation needs          Medical:  Not on file          Non-medical:  Not on file      Tobacco Use      ?  Smoking status:  Former Smoker      ?  Smokeless tobacco:  Never Used      ?  Tobacco comment: vape      Substance and Sexual Activity       ?  Alcohol use:  Not on file      ?  Drug use:  Not on file      ?  Sexual activity:  Not on file      Lifestyle      ?  Physical activity          Days per week:  Not on file          Minutes per session:  Not on file      ?  Stress:  Not on file      Relationships      ?  Social Biomedical engineer on phone:  Not on file          Gets together:  Not on file          Attends religious service:  Not on file          Active member of club or organization:  Not on file          Attends meetings of clubs or organizations:  Not on file          Relationship status:  Not on file      ?  Intimate partner violence          Fear of current or ex partner:  Not on file          Emotionally abused:  Not on file          Physically abused:  Not on file          Forced sexual activity:  Not on file      Other Topics  Concern      ?  Not on file      Social History Narrative      ?  Not on file            There is no problem  list on file for this patient.              Current Medications/Allergies        Medications and Allergies reviewed:      Current Outpatient Medications      Medication  Sig  Dispense  Refill      ?  amoxicillin-clavulanate (AUGMENTIN) 875-125 mg per tablet  Take 1 Tab by mouth every twelve (12) hours for 7 days.  14 Tab  0      ?  mesalamine ER (APRISO) 0.375 gram 24 hour capsule  Take 4 Caps by mouth daily.  1 Cap  0      ?  lisinopriL (PRINIVIL, ZESTRIL) 10 mg tablet  TAKE ONE TABLET BY MOUTH EVERY DAY  90 Tab  0      ?  dicyclomine (BENTYL) 10 mg capsule  Take 2 Caps by mouth four (4) times daily.  240 Cap  1      ?  venlafaxine-SR (EFFEXOR-XR) 37.5 mg capsule  Take 1 Cap by mouth daily.  90 Cap  3      ?  levocetirizine (XYZAL) 5 mg tablet  TAKE 1/2 TO 1 TABLET BY MOUTH EVERY DAY AS NEEDED  90 Tab  1           No Known Allergies

## 2019-02-11 NOTE — Progress Notes (Signed)
Kristine Banks  63 y.o. female  06-20-1955  [address]  [place] [id number]  [id number]   [facility]:    Telemedicine Progress Note  Veva HolesVictoria Velina Drollinger, [place]       Encounter Date and Time: February 11, 2019 at 3:37 PM    Consent:  She and/or the health care decision maker is aware that that she may receive a bill for this telephone service, depending on her insurance coverage, and has provided verbal consent to proceed: Yes    Chief Complaint   Patient presents with   ??? Ear Pain     History of Present Illness   Kristine Banks is a 63 y.o. female was evaluated by telephone.    63 y/o female calls complaining of ear pain that radiates towards jaw line on the left side and into teeth that started over a week ago. No facial pain or nasal congestion. Clear rhinorrhea. No fevers, chills, cough, sob, cp. Has been taking Xyzal for allergies. She has had similar symptoms before when she had a sinus infection. Symptoms have been worsening. Last sinus infection was years ago.     Review of Systems   Review of Systems   Constitutional: Negative for chills, fever and malaise/fatigue.   HENT: Positive for ear pain. Negative for congestion, ear discharge, hearing loss, sinus pain, sore throat and tinnitus.    Eyes: Negative for pain.   Respiratory: Negative for cough, sputum production, shortness of breath and wheezing.    Cardiovascular: Negative for chest pain and leg swelling.   Gastrointestinal: Negative for nausea and vomiting.   Musculoskeletal: Negative for myalgias.   Neurological: Negative for dizziness and headaches.       Vitals/Objective:     General: alert, cooperative, no distress   Mental  status: mental status: alert, oriented to person, place, and time, normal mood, behavior, speech, , and thought processes               Due to this being a TeleHealth evaluation, many elements of the physical examination are unable to be assessed.      Assessment and Plan:    Time-based coding, delete if not needed: I spent at least 15 minutes with this established patient, and >50% of the time was spent counseling and/or coordinating care regarding ear pain    1. Left ear pain: Similar pain that she's had during sinus infections in the past, but she is not having nasal congestion or facial pain. However, symptoms have been going on for over a week and are worsening while using OTC medications for allergies, so will treat with abx.  - amoxicillin-clavulanate (AUGMENTIN) 875-125 mg per tablet; Take 1 Tab by mouth every twelve (12) hours for 7 days.  Dispense: 14 Tab; Refill: 0  -F/u if still having symptoms after course of augmentin      Time spent in direct conversation with the patient to include medical condition(s) discussed, assessment and treatment plan:       We discussed the expected course, resolution and complications of the diagnosis(es) in detail.  Medication risks, benefits, costs, interactions, and alternatives were discussed as indicated.  I advised her to contact the office if her condition worsens, changes or fails to improve as anticipated. She expressed understanding with the diagnosis(es) and plan. Patient understands that this encounter was a temporary measure, and the importance of further follow up and examination was emphasized.  Patient verbalized understanding.  Patient informed to follow up: prn.    Electronically Signed: [name], DO    Providers location when delivering service (clinic, hospital, home): home    CPT Codes [phone] for Established Patients may apply to this Telehealth Visit.  POS code: 9.  Modifier GT      Pursuant to the emergency declaration under the [address] Act and the [employer], 1135 waiver authority and the [employer] Act, this Virtual  Visit was conducted, with patient's consent, to reduce the patient's risk  of exposure to COVID-19 and provide continuity of care for an established patient.     Services were provided through a video synchronous discussion virtually to substitute for in-person clinic visit.   History   Patients past medical, surgical and family histories were reviewed and updated.      Past Medical History:   Diagnosis Date   ??? Anxiety    ??? Depression    ??? Hypertension    ??? Pancreatitis 2004   ??? Recovering alcoholic ([facility])      Past Surgical History:   Procedure Laterality Date   ??? HX BREAST AUGMENTATION Bilateral 2011   ??? HX BUNIONECTOMY       Family History   Problem Relation Age of Onset   ??? Hypertension Mother    ??? Cancer Mother         glioblastoma   ??? Thyroid Disease Mother         Unsure if overactive or underactive   ??? Thyroid Disease Brother    ??? Hypertension Brother      Social History     Socioeconomic History   ??? Marital status: MARRIED     Spouse name: Not on file   ??? Number of children: Not on file   ??? Years of education: Not on file   ??? Highest education level: Not on file   Occupational History   ??? Not on file   Social Needs   ??? Financial resource strain: Not on file   ??? Food insecurity     Worry: Not on file     Inability: Not on file   ??? Transportation needs     Medical: Not on file     Non-medical: Not on file   Tobacco Use   ??? Smoking status: Former Smoker   ??? Smokeless tobacco: Never Used   ??? Tobacco comment: vape   Substance and Sexual Activity   ??? Alcohol use: Not on file   ??? Drug use: Not on file   ??? Sexual activity: Not on file   Lifestyle   ??? Physical activity     Days per week: Not on file     Minutes per session: Not on file   ??? Stress: Not on file   Relationships   ??? Social Wellsite geologist on phone: Not on file     Gets together: Not on file     Attends religious service: Not on file     Active member of club or organization: Not on file     Attends meetings of clubs or organizations: Not on file     Relationship status: Not on file   ??? Intimate partner violence      Fear of current or ex partner: Not on file     Emotionally abused: Not on file     Physically abused: Not on file     Forced sexual activity: Not on  file   Other Topics Concern   ??? Not on file   Social History Narrative   ??? Not on file     There is no problem list on file for this patient.         Current Medications/Allergies   Medications and Allergies reviewed:    Current Outpatient Medications   Medication Sig Dispense Refill   ??? amoxicillin-clavulanate (AUGMENTIN) 875-125 mg per tablet Take 1 Tab by mouth every twelve (12) hours for 7 days. 14 Tab 0   ??? mesalamine ER (APRISO) 0.375 gram 24 hour capsule Take 4 Caps by mouth daily. 1 Cap 0   ??? lisinopriL (PRINIVIL, ZESTRIL) 10 mg tablet TAKE ONE TABLET BY MOUTH EVERY DAY 90 Tab 0   ??? dicyclomine (BENTYL) 10 mg capsule Take 2 Caps by mouth four (4) times daily. 240 Cap 1   ??? venlafaxine-SR (EFFEXOR-XR) 37.5 mg capsule Take 1 Cap by mouth daily. 90 Cap 3   ??? levocetirizine (XYZAL) 5 mg tablet TAKE 1/2 TO 1 TABLET BY MOUTH EVERY DAY AS NEEDED 90 Tab 1     No Known Allergies

## 2019-02-11 NOTE — Progress Notes (Signed)
[facility] Attending Addendum:  Dr. Victoria Witt, DO,  the patient and I were not physically present during this encounter.  The resident and I are concurrently monitoring the patient care through appropriate telecommunication technology.  I discussed the findings, assessment and plan with the resident and agree with the resident's findings and plan as documented in the resident's note.      Nianna Igo Stephen Anquan Azzarello, MD

## 2019-02-11 NOTE — Telephone Encounter (Signed)
Error

## 2019-05-03 NOTE — Telephone Encounter (Addendum)
-----   Message from Dr [facility] sent at 05/03/2019  2:58 PM EST -----  Regarding: Dr Esquivel/ telephone  General Message/Vendor Calls    Caller's first and last name: Kristine Banks      Reason for call: returning call      Callback required yes/no and why: yes      Best contact number(s): [phone]      Details to clarify the request: Pt is requesting a call back and advised she  missed a previous call      [employer]

## 2019-05-03 NOTE — Telephone Encounter (Signed)
Attempted to call. Unsuccessful.message left  Will need to schedule an in person appt per Dr Sanda Klein

## 2019-05-04 NOTE — Telephone Encounter (Signed)
appt scheduled 07-11-19 with Dr Sanda Klein

## 2019-05-25 ENCOUNTER — Encounter

## 2019-05-25 MED ORDER — LISINOPRIL 10 MG TAB
10 mg | ORAL_TABLET | ORAL | 1 refills | Status: DC
Start: 2019-05-25 — End: 2019-12-21

## 2019-07-11 ENCOUNTER — Ambulatory Visit: Attending: Family Medicine | Primary: Family Medicine

## 2019-07-11 ENCOUNTER — Ambulatory Visit: Admit: 2019-07-11 | Discharge: 2019-07-11 | Payer: MEDICAID | Attending: Family Medicine | Primary: Family Medicine

## 2019-07-11 DIAGNOSIS — I10 Essential (primary) hypertension: Secondary | ICD-10-CM

## 2019-07-11 MED ORDER — CARBAMIDE PEROXIDE 6.5 % EAR DROPS
6.5 % | Freq: Two times a day (BID) | OTIC | 0 refills | Status: AC
Start: 2019-07-11 — End: 2019-07-15

## 2019-07-11 NOTE — Progress Notes (Signed)
ASCVD risk 3%. Labs stable.

## 2019-07-11 NOTE — Progress Notes (Signed)
 1. Have you been to the ER, urgent care clinic since your last visit?  Hospitalized since your last visit?No    2. Have you seen or consulted any other health care providers outside of the [facility] since your last visit?  Include any pap smears or colon screening. No    Reviewed record in preparation for visit and have necessary documentation  Goals that were addressed and/or need to be completed during or after this appointment include     Health Maintenance Due   Topic Date Due   . COVID-19 Vaccine (1) Never done   . Breast Cancer Screen Mammogram  05/08/2019       Patient is accompanied by self I have received verbal consent from Kristine Banks to discuss any/all medical information while they are present in the room.

## 2019-07-11 NOTE — Progress Notes (Signed)
CC: f/u HTN, dizziness    HPI: Pt is a 64 y.o. female who presents for f/u HTN. She has had some episodes of dizziness, particularly when she is going up stairs. She has not checked her BP when these episodes happen. She has been drinking plenty of water.      She has been having some allergy symptoms of rhinorrhea and sore throat in the mornings. She stopped her Xyzal because she heard it could     She reports that her brother was recently diagnosed with Factor V Leiden. He has not had any VTE and she thinks it was diagnosed during a pre-op work-up. She has several people in her family who have had strokes but no one with DVT's or PE's as far as she knows.     HTN:  Checking BPs at home?: YES, occasionally  Logs today?: NO  Range of BPs?: Unsure  Adding salt to foods?: Sometimes        Past Medical History:   Diagnosis Date   ??? Anxiety    ??? Depression    ??? Hypertension    ??? Pancreatitis 2004   ??? Recovering alcoholic ([place])        Family History   Problem Relation Age of Onset   ??? Hypertension Mother    ??? Cancer Mother         glioblastoma   ??? Thyroid Disease Mother         Unsure if overactive or underactive   ??? Thyroid Disease Brother    ??? Hypertension Brother    ??? Other Brother         Factor V Leiden       Social History     Tobacco Use   ??? Smoking status: Former Smoker   ??? Smokeless tobacco: Never Used   ??? Tobacco comment: vape   Substance Use Topics   ??? Alcohol use: Not on file   ??? Drug use: Not on file       ROS:  Per HPI    PE:  Visit Vitals  BP 94/63 (BP 1 Location: Left upper arm, BP Patient Position: Standing, BP Cuff Size: Adult)   Pulse 76   Temp 98.3 ??F (36.8 ??C) (Oral)   Resp 16   Ht 5' 5.5" (1.664 m)   Wt 131 lb (59.4 kg)   SpO2 97%   BMI 21.47 kg/m??     Gen: Pt sitting in chair, in NAD  Head: Normocephalic, atraumatic  Eyes: Sclera anicteric, EOM grossly intact, PERRL   Ears: L TM clear with pearly reflex. R TM partially obscured by cerumen  Throat: MMM, normal lips, tongue and gums  Neck:  Supple  CVS: Normal S1, S2, no m/r/g  Resp: CTAB, no wheezes or rales  Extrem: Atraumatic, no cyanosis or edema  Pulses: 2+   Skin: Warm, dry   Neuro: Alert, oriented, appropriate      A/P:   Encounter Diagnoses     ICD-10-CM ICD-9-CM   1. Essential hypertension  I10 401.9   2. Mixed hyperlipidemia  E78.2 272.2   3. Encounter for screening mammogram for malignant neoplasm of breast  Z12.31 V76.12   4. Impacted cerumen of right ear  H61.21 380.4     1. Essential hypertension: BP low today and pt has had symptoms of hypotension. Given this, will stop lisinopril today and pt to check BP's at home. She will call if it is >161/09  - METABOLIC PANEL, BASIC; Future  -  METABOLIC PANEL, BASIC    2. Mixed hyperlipidemia  - LIPID PANEL; Future  - LIPID PANEL    3. Encounter for screening mammogram for malignant neoplasm of breast  - MAM MAMMO BI DX INCL CAD; Future    4. Impacted cerumen of right ear  - carbamide peroxide (DEBROX) 6.5 % otic solution; Administer 5 Drops in right ear two (2) times a day for 4 days.  Dispense: 14.79 mL; Refill: 0       RTC in 1 year for f/u chronic conditions, or sooner prn    Discussed diagnoses in detail with patient.   Medication risks/benefits/side effects discussed with patient.   All of the patient's questions were addressed. The patient understands and agrees with our plan of care.  The patient knows to call back if they are unsure of or forget any changes we discussed today or if the symptoms change.  The patient received an After-Visit Summary which contains VS, orders, medication list and allergy list. This can be used as a "mini-medical record" should they have to seek medical care while out of town.    Current Outpatient Medications on File Prior to Visit   Medication Sig Dispense Refill   ??? colestipoL (COLESTID) 1 gram tablet Take 2 g by mouth daily.     ??? lisinopriL (PRINIVIL, ZESTRIL) 10 mg tablet TAKE ONE TABLET BY MOUTH EVERY DAY 90 Tab 1   ??? venlafaxine-SR (EFFEXOR-XR) 37.5 mg  capsule Take 1 Cap by mouth daily. 90 Cap 3     No current facility-administered medications on file prior to visit.

## 2019-07-11 NOTE — Progress Notes (Signed)
CC: f/u HTN, dizziness    HPI: Pt is a 64 y.o. female who presents for f/u HTN. She has had some episodes of dizziness, particularly when she is going up stairs. She has not checked her BP when these episodes happen. She has been drinking plenty of water.      She has been having some allergy symptoms of rhinorrhea and sore throat in the mornings. She stopped her Xyzal because she heard it could     She reports that her brother was recently diagnosed with Factor V Leiden. He has not had any VTE and she thinks it was diagnosed during a pre-op work-up. She has several people in her family who have had strokes but no one with DVT's or PE's as far as she knows.     HTN:  Checking BPs at home?: YES, occasionally  Logs today?: NO  Range of BPs?: Unsure  Adding salt to foods?: Sometimes        Past Medical History:   Diagnosis Date   ??? Anxiety    ??? Depression    ??? Hypertension    ??? Pancreatitis 2004   ??? Recovering alcoholic ([place])        Family History   Problem Relation Age of Onset   ??? Hypertension Mother    ??? Cancer Mother         glioblastoma   ??? Thyroid Disease Mother         Unsure if overactive or underactive   ??? Thyroid Disease Brother    ??? Hypertension Brother    ??? Other Brother         Factor V Leiden       Social History     Tobacco Use   ??? Smoking status: Former Smoker   ??? Smokeless tobacco: Never Used   ??? Tobacco comment: vape   Substance Use Topics   ??? Alcohol use: Not on file   ??? Drug use: Not on file       ROS:  Per HPI    PE:  Visit Vitals  BP 94/63 (BP 1 Location: Left upper arm, BP Patient Position: Standing, BP Cuff Size: Adult)   Pulse 76   Temp 98.3 ??F (36.8 ??C) (Oral)   Resp 16   Ht 5' 5.5" (1.664 m)   Wt 131 lb (59.4 kg)   SpO2 97%   BMI 21.47 kg/m??     Gen: Pt sitting in chair, in NAD  Head: Normocephalic, atraumatic  Eyes: Sclera anicteric, EOM grossly intact, PERRL   Ears: L TM clear with pearly reflex. R TM partially obscured by cerumen  Throat: MMM, normal lips, tongue and gums  Neck: Supple   CVS: Normal S1, S2, no m/r/g  Resp: CTAB, no wheezes or rales  Extrem: Atraumatic, no cyanosis or edema  Pulses: 2+   Skin: Warm, dry   Neuro: Alert, oriented, appropriate      A/P:   Encounter Diagnoses     ICD-10-CM ICD-9-CM   1. Essential hypertension  I10 401.9   2. Mixed hyperlipidemia  E78.2 272.2   3. Encounter for screening mammogram for malignant neoplasm of breast  Z12.31 V76.12   4. Impacted cerumen of right ear  H61.21 380.4     1. Essential hypertension: BP low today and pt has had symptoms of hypotension. Given this, will stop lisinopril today and pt to check BP's at home. She will call if it is >176/16  - METABOLIC PANEL, BASIC; Future  -  METABOLIC PANEL, BASIC    2. Mixed hyperlipidemia  - LIPID PANEL; Future  - LIPID PANEL    3. Encounter for screening mammogram for malignant neoplasm of breast  - MAM MAMMO BI DX INCL CAD; Future    4. Impacted cerumen of right ear  - carbamide peroxide (DEBROX) 6.5 % otic solution; Administer 5 Drops in right ear two (2) times a day for 4 days.  Dispense: 14.79 mL; Refill: 0       RTC in 1 year for f/u chronic conditions, or sooner prn    Discussed diagnoses in detail with patient.   Medication risks/benefits/side effects discussed with patient.   All of the patient's questions were addressed. The patient understands and agrees with our plan of care.  The patient knows to call back if they are unsure of or forget any changes we discussed today or if the symptoms change.  The patient received an After-Visit Summary which contains VS, orders, medication list and allergy list. This can be used as a "mini-medical record" should they have to seek medical care while out of town.    Current Outpatient Medications on File Prior to Visit   Medication Sig Dispense Refill   ??? colestipoL (COLESTID) 1 gram tablet Take 2 g by mouth daily.     ??? lisinopriL (PRINIVIL, ZESTRIL) 10 mg tablet TAKE ONE TABLET BY MOUTH EVERY DAY 90 Tab 1   ??? venlafaxine-SR (EFFEXOR-XR) 37.5 mg capsule  Take 1 Cap by mouth daily. 90 Cap 3     No current facility-administered medications on file prior to visit.

## 2019-07-11 NOTE — Progress Notes (Signed)
ASCVD risk 3%. Labs stable.

## 2019-07-11 NOTE — Progress Notes (Signed)
1. Have you been to the ER, urgent care clinic since your last visit?  Hospitalized since your last visit?No    2. Have you seen or consulted any other health care providers outside of the [facility] since your last visit?  Include any pap smears or colon screening. No    Reviewed record in preparation for visit and have necessary documentation  Goals that were addressed and/or need to be completed during or after this appointment include     Health Maintenance Due   Topic Date Due   ??? COVID-19 Vaccine (1) Never done   ??? Breast Cancer Screen Mammogram  05/08/2019       Patient is accompanied by self I have received verbal consent from Ricki E Naser to discuss any/all medical information while they are present in the room.

## 2019-07-11 NOTE — Patient Instructions (Signed)
Dizziness: Care Instructions  Your Care Instructions  Dizziness is the feeling of unsteadiness or fuzziness in your head. It is different than having vertigo, which is a feeling that the room is spinning or that you are moving or falling. It is also different from lightheadedness, which is the feeling that you are about to faint.  It can be hard to know what causes dizziness. Some people feel dizzy when they have migraine headaches. Sometimes bouts of flu can make you feel dizzy. Some medical conditions, such as heart problems or high blood pressure, can make you feel dizzy. Many medicines can cause dizziness, including medicines for high blood pressure, pain, or anxiety.  If a medicine causes your symptoms, your doctor may recommend that you stop or change the medicine. If it is a problem with your heart, you may need medicine to help your heart work better. If there is no clear reason for your symptoms, your doctor may suggest watching and waiting for a while to see if the dizziness goes away on its own.  Follow-up care is a key part of your treatment and safety. Be sure to make and go to all appointments, and call your doctor if you are having problems. It's also a good idea to know your test results and keep a list of the medicines you take.  How can you care for yourself at home?  ?? If your doctor recommends or prescribes medicine, take it exactly as directed. Call your doctor if you think you are having a problem with your medicine.  ?? Do not drive while you feel dizzy.  ?? Try to prevent falls. Steps you can take include:  ? Using nonskid mats, adding grab bars near the tub, and using night-lights.  ? Clearing your home so that walkways are free of anything you might trip on.  ? Letting family and friends know that you have been feeling dizzy. This will help them know how to help you.  When should you call for help?   Call 911 anytime you think you may need emergency care. For example, call if:  ?? ?? You passed  out (lost consciousness).   ?? ?? You have dizziness along with symptoms of a heart attack. These may include:  ? Chest pain or pressure, or a strange feeling in the chest.  ? Sweating.  ? Shortness of breath.  ? Nausea or vomiting.  ? Pain, pressure, or a strange feeling in the back, neck, jaw, or upper belly or in one or both shoulders or arms.  ? Lightheadedness or sudden weakness.  ? A fast or irregular heartbeat.   ?? ?? You have symptoms of a stroke. These may include:  ? Sudden numbness, tingling, weakness, or loss of movement in your face, arm, or leg, especially on only one side of your body.  ? Sudden vision changes.  ? Sudden trouble speaking.  ? Sudden confusion or trouble understanding simple statements.  ? Sudden problems with walking or balance.  ? A sudden, severe headache that is different from past headaches.   Call your doctor now or seek immediate medical care if:  ?? ?? You feel dizzy and have a fever, headache, or ringing in your ears.   ?? ?? You have new or increased nausea and vomiting.   ?? ?? Your dizziness does not go away or comes back.   Watch closely for changes in your health, and be sure to contact your doctor if:  ?? ?? You  do not get better as expected.   Where can you learn more?  Go to https://www.healthwise.net/[facility]  Enter Q823 in the search box to learn more about "Dizziness: Care Instructions."  Current as of: May 19, 2018??????????????????????????????Content Version: 12.8  ?? 2006-2021 Healthwise[employer] Inc[employer].   Care instructions adapted under license by [employer] (which disclaims liability or warranty for this information). If you have questions about a medical condition or this instruction, always ask your healthcare professional. Healthwise, Incorporated disclaims any warranty or liability for your use of this information.

## 2019-07-12 LAB — METABOLIC PANEL, BASIC
Anion gap: 4 mmol/L — ABNORMAL LOW (ref 5–15)
BUN/Creatinine ratio: 14 (ref 12–20)
BUN: 14 MG/DL (ref 6–20)
CO2: 27 mmol/L (ref 21–32)
Calcium: 9.3 MG/DL (ref 8.5–10.1)
Chloride: 107 mmol/L (ref 97–108)
Creatinine: 0.99 MG/DL (ref 0.55–1.02)
GFR est AA: >60 mL/min/{1.73_m2} (ref >60)
GFR est non-AA: 57 mL/min/{1.73_m2} — ABNORMAL LOW (ref >60)
Glucose: 87 mg/dL (ref 65–100)
Potassium: 4.9 mmol/L (ref 3.5–5.1)
Sodium: 138 mmol/L (ref 136–145)

## 2019-07-12 LAB — LIPID PANEL
CHOL/HDL Ratio: 2.9 (ref 0.0–5.0)
Chol/HDL Ratio: 2.9 (ref 0.0–5.0)
Cholesterol, Total: 237 MG/DL — ABNORMAL HIGH (ref <200)
Cholesterol, total: 237 MG/DL — ABNORMAL HIGH (ref <200)
HDL Cholesterol: 82 MG/DL
HDL: 82 MG/DL
LDL Calculated: 141 MG/DL — ABNORMAL HIGH (ref 0–100)
LDL, calculated: 141 MG/DL — ABNORMAL HIGH (ref 0–100)
Triglyceride: 70 MG/DL (ref <150)
Triglycerides: 70 MG/DL (ref <150)
VLDL Cholesterol Calculated: 14 MG/DL
VLDL, calculated: 14 MG/DL

## 2019-07-12 LAB — BASIC METABOLIC PANEL
Anion Gap: 4 mmol/L — ABNORMAL LOW (ref 5–15)
BUN/Creatinine Ratio: 14 (ref 12–20)
BUN: 14 MG/DL (ref 6–20)
CO2: 27 mmol/L (ref 21–32)
Calcium: 9.3 MG/DL (ref 8.5–10.1)
Chloride: 107 mmol/L (ref 97–108)
Creatinine: 0.99 MG/DL (ref 0.55–1.02)
GFR African American: >60 mL/min/{1.73_m2} (ref >60)
Glucose: 87 mg/dL (ref 65–100)
Potassium: 4.9 mmol/L (ref 3.5–5.1)
Sodium: 138 mmol/L (ref 136–145)
eGFR NON-AA: 57 mL/min/{1.73_m2} — ABNORMAL LOW (ref >60)

## 2019-10-29 ENCOUNTER — Encounter

## 2019-10-31 MED ORDER — VENLAFAXINE SR 75 MG 24 HR CAP
75 mg | ORAL_CAPSULE | Freq: Every day | ORAL | 1 refills | Status: DC
Start: 2019-10-31 — End: 2020-01-31

## 2019-10-31 MED ORDER — VENLAFAXINE SR 37.5 MG 24 HR CAP
37.5 mg | ORAL_CAPSULE | ORAL | 1 refills | Status: DC
Start: 2019-10-31 — End: 2019-10-31

## 2019-10-31 NOTE — Telephone Encounter (Signed)
Telephone Encounter by Eugene Garnet, MD at 10/31/19 1714                Author: Eugene Garnet, MD  Service: --  Author Type: Physician       Filed: 10/31/19 1714  Encounter Date: 10/29/2019  Status: Signed          Editor: Eugene Garnet, MD (Physician)          From: Hulen ShoutsEugene Garnet, MDSent: 10/29/2019 11:25 AM EDTSubject: Prescription QuestionHi Dr. Sanda Klein ~  [facility] this finds you doing  great!I need a refill of my Effexorxr 37.5 - it expired 3 days ago for a 90-day supply.  You'd think that they would refill it!  I also wanted to let you know that I started doubling the dose of this ( I know, don't self-medicate :/ ....) because I have  been feeling very drained, blue, scattered, not focused.  It's been a very hectic and increased stress level for me/us over the last couple of months as well.  It's been a couple of weeks and I'm feeling a lot better so I'm thinking I should continue  this course of action for a while.  Thoughts?Thanks!Best,Satonya

## 2019-12-21 ENCOUNTER — Ambulatory Visit: Attending: Family Medicine | Primary: Family Medicine

## 2019-12-21 ENCOUNTER — Ambulatory Visit: Admit: 2019-12-21 | Discharge: 2019-12-21 | Payer: MEDICAID | Attending: Family Medicine | Primary: Family Medicine

## 2019-12-21 DIAGNOSIS — H9202 Otalgia, left ear: Secondary | ICD-10-CM

## 2019-12-21 MED ORDER — AMOXICILLIN CLAVULANATE 875 MG-125 MG TAB
875-125 mg | ORAL_TABLET | Freq: Two times a day (BID) | ORAL | 0 refills | Status: AC
Start: 2019-12-21 — End: 2019-12-28

## 2019-12-21 MED ORDER — PREDNISONE 20 MG TAB
20 mg | ORAL_TABLET | Freq: Two times a day (BID) | ORAL | 0 refills | Status: DC
Start: 2019-12-21 — End: 2020-02-28

## 2019-12-21 NOTE — Progress Notes (Signed)
 1. Have you been to the ER, urgent care clinic since your last visit?  Hospitalized since your last visit?No    2. Have you seen or consulted any other health care providers outside of the [facility] since your last visit?  Include any pap smears or colon screening. No    Reviewed record in preparation for visit and have necessary documentation  Pt did not bring medication to office visit for review  Patient is accompanied by self I have received verbal consent from Kristine Banks to discuss any/all medical information while they are present in the room.    Goals that were addressed and/or need to be completed during or after this appointment include     Health Maintenance Due   Topic Date Due   . Cervical cancer screen  05/08/2017   . Breast Cancer Screen Mammogram  05/08/2019   . Colorectal Cancer Screening Combo  10/07/2019   . Flu Vaccine (1) 11/23/2019

## 2019-12-21 NOTE — Telephone Encounter (Signed)
-----   Message from Daved Moment, MD sent at 12/21/2019 10:28 AM EDT -----  Regarding: FW: Non-Urgent Medical Question  Contact: [phone]  Can we please get her an appt for this? I would not recommend her taking an anitbiotic unless we know if there is an infection. It needs to be in person. I think her flight may be very soon, like possibly tomorrow, so she may need to see urgent care for this, but I would not recommend her using an antibiotic unless there is a confirmed infection. Thanks!  ----- Message -----  From: Manuela Raisin, LPN  Sent: [id number]/70/7978   9:15 AM EDT  To: Daved Moment, MD  Subject: FW: Non-Urgent Medical Question                    ----- Message -----  From: [name]  Sent: 12/20/2019   7:51 PM EDT  To: [facility] Nurse Pool  Subject: RE: Non-Urgent Medical Question                  W/the upcoming flight, can you please send in an antibiotic to [facility].  Flight is 5 - 6 hrs and I'm scared of doing damage.  My right ear is killing me - travels down my neck a little.  It actually hurts to put pressure on it.  Must have fluid trapped in my ear.  Heard a little crinkling in it the other day.  Our spoiled rotten cat Rebekah) and I have both been lovin' ragweed season and whatever else is out there!!  Please let me know and I guess I'll be seeing you soon for my FAV - - NOT!!  ---  PAP Smear  : /  Thank you for all of your help!!  Kristine Banks, Kristine Banks

## 2019-12-21 NOTE — Progress Notes (Signed)
Patient: Kristine Banks MRN: [id number]  SSN: [id number]    Date of Birth: 08/18/1955  Age: 64 y.o.  Sex: female      Chief Complaint   Patient presents with   ??? Ear Pain     right ear pain for a couple weeks      Kristine Banks is a 64 y.o. female  who presents with complaints of intermittent right ear pain for the past 10 days. She is concerned because she will be flying to [facility] in 2 days. There has been no nausea and no vomiting. There is a hx of allergic rhinitis. Patient with hx of HTN and anxiety.     BP Readings from Last 3 Encounters:   12/21/19 (!) 145/72   07/11/19 94/63   05/05/18 117/75       Medications:     Current Outpatient Medications   Medication Sig   ??? amoxicillin-clavulanate (AUGMENTIN) 875-125 mg per tablet Take 1 Tablet by mouth every twelve (12) hours for 7 days.   ??? predniSONE (DELTASONE) 20 mg tablet Take 20 mg by mouth two (2) times a day.   ??? venlafaxine-SR (EFFEXOR-XR) 75 mg capsule Take 1 Capsule by mouth daily.   ??? colestipoL (COLESTID) 1 gram tablet Take 2 g by mouth daily.   ??? lisinopriL (PRINIVIL, ZESTRIL) 10 mg tablet TAKE ONE TABLET BY MOUTH EVERY DAY     No current facility-administered medications for this visit.       Problem List:   There are no problems to display for this patient.      Medical History:     Past Medical History:   Diagnosis Date   ??? Anxiety    ??? Depression    ??? Hypertension    ??? Pancreatitis 2004   ??? Recovering alcoholic ([facility])        Allergies:     Allergies   Allergen Reactions   ??? Ragweed Pollen Sneezing       Surgical History:     Past Surgical History:   Procedure Laterality Date   ??? HX BREAST AUGMENTATION Bilateral 2011   ??? HX BUNIONECTOMY         Social History:     Social History     Socioeconomic History   ??? Marital status: MARRIED     Spouse name: Not on file   ??? Number of children: Not on file   ??? Years of education: Not on file   ??? Highest education level: Not on file   Tobacco Use   ??? Smoking status: Former Smoker   ??? Smokeless tobacco:  Never Used   ??? Tobacco comment: vape     Social Determinants of Psychologist, prison and probation services Strain:    ??? Difficulty of Paying Living Expenses:    Food Insecurity:    ??? Worried About Programme researcher, broadcasting/film/video in the Last Year:    ??? Barista in the Last Year:    Transportation Needs:    ??? Freight forwarder (Medical):    ??? Lack of Transportation (Non-Medical):    Physical Activity:    ??? Days of Exercise per Week:    ??? Minutes of Exercise per Session:    Stress:    ??? Feeling of Stress :    Social Connections:    ??? Frequency of Communication with Friends and Family:    ??? Frequency of Social Gatherings with Friends and Family:    ???  Attends Religious Services:    ??? Active Member of Clubs or Organizations:    ??? Attends Banker Meetings:    ??? Marital Status:        Review of Symptoms:  Constitutional: c/o malaise, denies fever or chills  Skin: Negative for rash or lesion  Head: Negative for facial swelling or tenderness  Eyes: Negative for redness or discharge  Ears: Negative for otalgia or decreased hearing  Nose: c/o nasal congestion, denies sinus pressure  Neck: c/o sore throat, denies lymphadenopathy   Cardiovascular: Negative for chest pain or palpitations  Respiratory: c/o non-productive cough, denies wheezing or SOB  Gastrointestinal: Negative for nausea or abdominal pain  Neurologic: Negative for headache or dizziness      Visit Vitals  BP (!) 145/72 (BP 1 Location: Right upper arm, BP Patient Position: Sitting, BP Cuff Size: Adult)   Pulse 69   Temp 98 ??F (36.7 ??C) (Oral)   Resp 14   Ht 5' 5.5" (1.664 m)   Wt 125 lb 3.2 oz (56.8 kg)   SpO2 99%   BMI 20.52 kg/m??       Physical Examination:  General: Well developed, well nourished, in no acute distress  Skin: Warm and dry sans rash or lesion  Head: Normocephalic, atraumatic  Eyes: Sclera clear, EOMI,  Ears: right tympanic membranes dull bulging  Neck: Normal range of motion, no lymphadenopathy  Cardiovascular: normal S1, S2, regular rate and  rhythm  Respiratory: Clear to auscultation bilaterally with symmetrical, unlabored effort  Extremities: Full range of motion  Neurologic: Active, alert and oriented      Diagnoses and all orders for this visit:    1. Left ear pain  -     amoxicillin-clavulanate (AUGMENTIN) 875-125 mg per tablet; Take 1 Tablet by mouth every twelve (12) hours for 7 days.  -     predniSONE (DELTASONE) 20 mg tablet; Take 20 mg by mouth two (2) times a day.    2. Essential hypertension    3. Anxiety      Plan of care:  Diagnoses were discussed in detail with patient.   Patient advised to hold augmentin and only start of symptoms worsen while on steroid.  Medications reviewed and appropriate.   Patient to continue current prescribed medications as written.    Medication risks/benefits/side effects discussed with patient.   All of the patient's questions were addressed and answered to apparent satisfaction.   The patient understands and agrees with our plan of care.  The patient knows to call back if they have questions about the plan of care or if symptoms change.  The patient received an After-Visit Summary which contains VS, diagnoses, orders, allergy and medication lists.      No future appointments.

## 2019-12-21 NOTE — Telephone Encounter (Signed)
Called patient and appt scheduled.

## 2019-12-23 ENCOUNTER — Encounter

## 2019-12-23 NOTE — Telephone Encounter (Signed)
Pt requesting referrals for mammogram and colonoscopy. Referrals placed.

## 2020-01-30 ENCOUNTER — Encounter

## 2020-01-31 MED ORDER — VENLAFAXINE SR 75 MG 24 HR CAP
75 mg | ORAL_CAPSULE | ORAL | 1 refills | Status: DC
Start: 2020-01-31 — End: 2020-03-13

## 2020-02-02 NOTE — Telephone Encounter (Signed)
Pt need referral for Mammogram with provider who accepts medicaid.

## 2020-02-02 NOTE — Telephone Encounter (Signed)
Order written 12-23-19

## 2020-02-20 NOTE — Telephone Encounter (Signed)
 Reason for Disposition  . MODERATE difficulty breathing (e.g., speaks in phrases, SOB even at rest, pulse 100-120)    Answer Assessment - Initial Assessment Questions  1. COVID-19 DIAGNOSIS: Who made your Coronavirus (COVID-19) diagnosis? Was it confirmed by a positive lab test? If not diagnosed by a HCP, ask Are there lots of cases (community spread) where you live? (See public health department website, if unsure)      No dx    2. COVID-19 EXPOSURE: Was there any known exposure to COVID before the symptoms began? CDC Definition of close contact: within 6 feet (2 meters) for a total of 15 minutes or more over a 24-hour period.       No known exposure    3. ONSET: When did the COVID-19 symptoms start?       Late last night     4. WORST SYMPTOM: What is your worst symptom? (e.g., cough, fever, shortness of breath, muscle aches)      Abdominal pain, short of breath     5. COUGH: Do you have a cough? If Yes, ask: How bad is the cough?        No cough    6. FEVER: Do you have a fever? If Yes, ask: What is your temperature, how was it measured, and when did it start?      Chills and sweats, but unable to check temperature    7. RESPIRATORY STATUS: Describe your breathing? (e.g., shortness of breath, wheezing, unable to speak)       Short of breath at all times, some wheezing    8. BETTER-SAME-WORSE: Are you getting better, staying the same or getting worse compared to yesterday?  If getting worse, ask, In what way?      Felt fine yesterday     9. HIGH RISK DISEASE: Do you have any chronic medical problems? (e.g., asthma, heart or lung disease, weak immune system, obesity, etc.)      Colitis only     10. PREGNANCY: Is there any chance you are pregnant? When was your last menstrual period?        N/a     11. OTHER SYMPTOMS: Do you have any other symptoms?  (e.g., chills, fatigue, headache, loss of smell or taste, muscle pain, sore throat; new loss of smell or taste especially support  the diagnosis of COVID-19)        Lost of taste, aches, loss of taste, nausea, abdominal pain, short of breath    Protocols used: COVID-19 - DIAGNOSED OR SUSPECTED-ADULT-AH    Received call from [place] at [facility] with Red Flag Complaint.    Brief description of triage: see above    Triage indicates for patient to go to ED now.     Care advice provided, patient verbalizes understanding; denies any other questions or concerns; instructed to call back for any new or worsening symptoms.    Attention Provider:  Thank you for allowing me to participate in the care of your patient.  The patient was connected to triage in response to information provided to the ECC.  Please do not respond through this encounter as the response is not directed to a shared pool.

## 2020-02-22 NOTE — Telephone Encounter (Signed)
Pt is severely sick and extremely nauseous. Pt made appt for 12-6, but asked if Dr could call in something for the nausea before then. Please advise.

## 2020-02-22 NOTE — Telephone Encounter (Signed)
Spoke with patient  She will continue a clear liquid diet and advance as tolerated  Will see urgent care for worsening symptoms   She will call tomorrow for any available appt

## 2020-02-24 NOTE — Telephone Encounter (Signed)
Shortness of breath, weakness, nausea. Pt would like nurse or Dr to call her back.

## 2020-02-24 NOTE — Telephone Encounter (Signed)
Pt reports  SOB continues with nausea  No available appt in office suggested pt be evaluated in urgent care for symptoms  Pt agreed and wanted Dr to be aware.  She will keep her appt 02-27-20

## 2020-02-24 NOTE — Telephone Encounter (Signed)
Noted. Will see how she is doing on Monday.

## 2020-02-27 ENCOUNTER — Ambulatory Visit: Attending: Family Medicine | Primary: Family Medicine

## 2020-02-27 ENCOUNTER — Ambulatory Visit: Admit: 2020-02-27 | Discharge: 2020-02-27 | Payer: MEDICAID | Attending: Family Medicine | Primary: Family Medicine

## 2020-02-27 DIAGNOSIS — Z09 Encounter for follow-up examination after completed treatment for conditions other than malignant neoplasm: Secondary | ICD-10-CM

## 2020-02-27 NOTE — Progress Notes (Signed)
CC: Transition of care    HPI: Pt is a 64 y.o. female who presents for transition of care.    Admission diagnosis: MI  Facility: [place]  Admission date: 02/24/20  Discharge date: 02/26/20  Date of contact with Nurse Navigator (please see note): N/A  Discharge records personally reviewed?: Partial records available and reviewed, full records requested.     Summary of admission: She was having episodes of severe stomach pain daily for about a week. On Friday night she was very tired and woke up at 3AM with severe stomach pain, nausea, diaphoresis, and went to Urgent Care in [place], had an EKG done and was told she had had a heart attack. She was sent to the [facility] ER and then transferred to [facility] where she was seen by Cardiology. She had a cath that showed 20-30% blockage of the RCA, 30-40% L main coronary artery and 20% of the L circumflex. No intervention was done. She was started on atorvastatin, Plavix, metoprolol, and aspirin.     Since getting home she is feeling well. She is no longer having episodes of abdominal pain. She is wearing a Holter monitor for 7 days and is tolerating the new medications without issues.      Outstanding tests/results needing review?: None  Follow-up visits needed: Cards - has appt with Cards in [place] this week.   Changes in medications?: Per above      Past Medical History:   Diagnosis Date   ??? Anxiety    ??? Depression    ??? Hypertension    ??? Myocardial infarction [facility])    ??? Pancreatitis 2004   ??? Recovering alcoholic (HCC)        Family History   Problem Relation Age of Onset   ??? Hypertension Mother    ??? Cancer Mother         glioblastoma   ??? Thyroid Disease Mother         Unsure if overactive or underactive   ??? Thyroid Disease Brother    ??? Hypertension Brother    ??? Other Brother         Factor V Leiden       Social History     Tobacco Use   ??? Smoking status: Former Smoker   ??? Smokeless tobacco: Never Used   ??? Tobacco comment: vape   Substance Use Topics   ???  Alcohol use: Not on file   ??? Drug use: Not on file       ROS:  Per HPI    PE:  Visit Vitals  BP (!) 105/55   Pulse 64   Temp 97.4 ??F (36.3 ??C)   Resp 16   Ht 5' 5.5" (1.664 m)   Wt 121 lb (54.9 kg)   SpO2 98%   BMI 19.83 kg/m??     Gen: Pt sitting in chair, in NAD  Head: Normocephalic, atraumatic  Eyes: Sclera anicteric, EOM grossly intact, PERRL  Ears: TM's pearly with good light reflex b/l  Nose: Normal nasal mucosa  Throat: MMM, normal lips, tongue, teeth and gums  Neck: Supple, no LAD, no thyromegaly or carotid bruits  CVS: Normal S1, S2, no m/r/g  Resp: CTAB, no wheezes or rales   Extrem: Atraumatic, no cyanosis or edema  Pulses: 2+   Skin: Warm, dry  Neuro: Alert, oriented, appropriate      A/P: Pt is a 64 y.o. female who presents for transition of care for MI.   - Full hospital  records requested.   - Keep follow-up with Cardiology. List of questions created including how long she will be on Plavix and aspirin considering minor blockages on cath, follow-up, etc.     RTC in 3 months for f/u chronic conditions, or sooner prn      A total of 40 minutes was spent on this encounter including 25 minutes obtaining the history and physical exam, 5 minutes reviewing discharge records and prior labs, and 10 minutes documenting.     Discussed diagnoses in detail with patient.   Medication risks/benefits/side effects discussed with patient.   All of the patient's questions were addressed. The patient understands and agrees with our plan of care.  The patient knows to call back if they are unsure of or forget any changes we discussed today or if the symptoms change.  The patient received an After-Visit Summary which contains VS, orders, medication list and allergy list. This can be used as a "mini-medical record" should they have to seek medical care while out of town.    Current Outpatient Medications on File Prior to Visit   Medication Sig Dispense Refill   ??? atorvastatin (LIPITOR) 80 mg tablet Take 80 mg by mouth.     ???  famotidine (Pepcid) 20 mg tablet Take 20 mg by mouth.     ??? metoprolol tartrate (LOPRESSOR) 25 mg tablet      ??? clopidogreL (Plavix) 75 mg tab Take 75 mg by mouth.     ??? aspirin 81 mg chewable tablet Take 81 mg by mouth.     ??? venlafaxine-SR (EFFEXOR-XR) 75 mg capsule TAKE ONE CAPSULE BY MOUTH DAILY 90 Capsule 1   ??? colestipoL (COLESTID) 1 gram tablet Take 2 g by mouth daily.     ??? predniSONE (DELTASONE) 20 mg tablet Take 20 mg by mouth two (2) times a day. (Patient not taking: Reported on 02/27/2020) 10 Tablet 0     No current facility-administered medications on file prior to visit.

## 2020-02-27 NOTE — Progress Notes (Signed)
 1. Have you been to the ER, urgent care clinic since your last visit?  Hospitalized since your last visit?No    2. Have you seen or consulted any other health care providers outside of the [facility] since your last visit?  Include any pap smears or colon screening. No  Reviewed record in preparation for visit and have necessary documentation  Pt did not bring medication to office visit for review    Goals that were addressed and/or need to be completed during or after this appointment include   Health Maintenance Due   Topic Date Due   . Breast Cancer Screen Mammogram  05/08/2019   . COVID-19 Vaccine (2 - Booster for [employer] series) 08/17/2019   . Colorectal Cancer Screening Combo  10/07/2019   . Flu Vaccine (1) 11/23/2019

## 2020-02-28 NOTE — Telephone Encounter (Signed)
Spoke with pt   She reports no heart attack  It was a gall bladder attack  And continues to be in the hospital  Will follow up when discharged  And will schedule surgery after antibiotic therapy

## 2020-02-28 NOTE — Telephone Encounter (Signed)
MyChart message sent

## 2020-02-28 NOTE — Telephone Encounter (Signed)
Pt called and stated she is at [facility] for a possible gallbladder problem. Pt says that the hospital does not have any beds available, and she is in the ER dept since last night. Pt would like to be transferred to [facility]. Pt says that it feels like when she had her heart attack, and would like Dr to call her.

## 2020-03-13 ENCOUNTER — Encounter

## 2020-03-13 MED ORDER — VENLAFAXINE SR 37.5 MG 24 HR CAP
37.5 mg | ORAL_CAPSULE | Freq: Every day | ORAL | 3 refills | Status: DC
Start: 2020-03-13 — End: 2020-06-15

## 2020-03-13 MED ORDER — STYE LUBRICANT 57.7 %-31.9 % EYE OINTMENT
OPHTHALMIC | 0 refills | Status: DC | PRN
Start: 2020-03-13 — End: 2020-10-09

## 2020-03-13 NOTE — Telephone Encounter (Signed)
Pt is going out of town  tomorrow and would like you to look at the [employer]

## 2020-04-30 ENCOUNTER — Telehealth

## 2020-04-30 ENCOUNTER — Encounter

## 2020-04-30 NOTE — Telephone Encounter (Signed)
Reordered Mammogram for patient.  Will see if right order.    Kristine Cabal, MD  [facility]  04/30/20

## 2020-04-30 NOTE — Telephone Encounter (Signed)
Need order for Bil Diagnotic and  Rt Breast Ultrasound Limited. Pt will be coming in tomorrow.    [name] with [employer] [phone]

## 2020-05-01 ENCOUNTER — Encounter

## 2020-05-01 NOTE — Progress Notes (Signed)
MAMMO and [place] orders done.

## 2020-05-01 NOTE — Telephone Encounter (Signed)
Need orders for: Bilateral Diagnostic Mammogram, Right Breast Ultrasound Limited. Diagnosis Right Breast Pain. Pt's appt is at 10:30 today.  Fax [phone]

## 2020-05-01 NOTE — Telephone Encounter (Signed)
Called Imaging center, appears they have the appropriate orders at this time and appointment is scheduled for February 23rd.    Altamese Cabal, MD  [facility]  05/01/20

## 2020-05-23 NOTE — Telephone Encounter (Signed)
Please reorder for LIMITED US    Previous order is for complete  Please reorder   Then Fax number [phone]

## 2020-05-23 NOTE — Telephone Encounter (Signed)
[facility] states that they need a updated order for a Limited US of breast pt has an appt tomorrow morning  Fax number [phone]

## 2020-05-24 ENCOUNTER — Encounter

## 2020-05-29 ENCOUNTER — Telehealth: Attending: Student in an Organized Health Care Education/Training Program | Primary: Family Medicine

## 2020-05-29 ENCOUNTER — Telehealth
Admit: 2020-05-29 | Discharge: 2020-05-29 | Payer: MEDICAID | Attending: Student in an Organized Health Care Education/Training Program | Primary: Family Medicine

## 2020-05-29 ENCOUNTER — Encounter: Payer: MEDICAID | Attending: Family Medicine | Primary: Family Medicine

## 2020-05-29 DIAGNOSIS — N3000 Acute cystitis without hematuria: Secondary | ICD-10-CM

## 2020-05-29 MED ORDER — TRIMETHOPRIM-SULFAMETHOXAZOLE 160 MG-800 MG TAB
160-800 mg | ORAL_TABLET | Freq: Two times a day (BID) | ORAL | 0 refills | Status: AC
Start: 2020-05-29 — End: 2020-06-01

## 2020-05-29 NOTE — Progress Notes (Signed)
[facility] Attending Addendum:  Dr. Jeffrey Irwin, MD,  the patient and I were not physically present during this encounter.  The resident and I are concurrently monitoring the patient care through appropriate telecommunication technology.  I discussed the findings, assessment and plan with the resident and agree with the resident's findings and plan as documented in the resident's note.      Keisha Amer Stephen Johnesha Acheampong, MD

## 2020-05-29 NOTE — Progress Notes (Signed)
Progress  Notes by Hermina Staggers, MD at 05/29/20 0800                Author: Hermina Staggers, MD  Service: --  Author Type: Resident       Filed: 05/29/20 0934  Encounter Date: 05/29/2020  Status: Signed          Editor: Hermina Staggers, MD (Resident)                       [name] (DOB: 04-02-1955 ) is a 65 y.o. female, established  patient, here for evaluation of the following chief complaint(s):    No chief complaint on file.          ASSESSMENT/PLAN:   1. Acute cystitis without hematuria- Can continue with cranberry juice to help symptoms. No history of recurrent UTIs or at major risk for  multi-drug resistant bacteria. Will attempt empiric treatment with Bactrim. RTC in 1 wk if symptoms not improving.   -     trimethoprim-sulfamethoxazole (BACTRIM DS, SEPTRA DS) 160-800 mg per tablet; Take 1 Tablet by mouth two (2) times a day for 3 days., Normal, Disp-6 Tablet, R-0   2. Coronary artery disease involving native coronary artery of native heart without angina pectoris- Reviewed recent cath,  NSTEMI and Echo results. 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 Pt taking ASA BID and had stopped statin due to concerns with grapefruit consumption. Recommend taking ASA just daily and Lipitor 40mg . Recommend taking no more  than modest amounts of grapefruit juice daily. Discussed <1.2L daily recommended.          Return in about 1 week (around 06/05/2020), or if symptoms worsen or fail to improve.            SUBJECTIVE/OBJECTIVE:   HPI   UTI- Started about 2-3 days ago. Drank a lot of cranberry juice yesterday. Has burning with cessation of urination. Feels internally, not external burning. Cannot remember the last time she had a UTI.  Only recent Abx was about 3 months ago due to cholecystitis.          Constitutional: [x]   Appears well-developed and well-nourished [x]  No apparent  distress       []   Abnormal -       Mental status: [x]  Alert and awake  [x]  Oriented to  person/place/time [x]   Able to follow commands     []  Abnormal -       Eyes:   EOM    [x]   Normal    []  Abnormal -    Sclera  [x]    Normal    []  Abnormal -           Discharge [x]    None visible   []  Abnormal -       HENT: [x]  Normocephalic, atraumatic  []  Abnormal -    [x]  Mouth/Throat: Mucous membranes  are moist      External Ears [x]  Normal  []  Abnormal -      Neck: [x]  No visualized mass []  Abnormal -       Pulmonary/Chest: [x]  Respiratory effort normal   [x]  No visualized signs of difficulty breathing or respiratory distress         []   Abnormal -        Neurological:        [x]   No Facial Asymmetry (Cranial nerve 7 motor function) (limited exam due to video visit)           [  x]  No gaze palsy         []  Abnormal -            Skin:        [x]   No significant exanthematous lesions or discoloration noted on facial skin          []   Abnormal -              Psychiatric:       [x]  Normal Affect []  Abnormal -         [x]   No Hallucinations      Other pertinent observable physical exam findings:-            Current Outpatient Medications:    ?  trimethoprim-sulfamethoxazole (BACTRIM DS, SEPTRA DS) 160-800 mg per tablet, Take 1 Tablet by mouth two (2) times a day for 3 days., Disp: 6 Tablet, Rfl: 0   ?  venlafaxine-SR (EFFEXOR-XR) 37.5 mg capsule, Take 1 Capsule by mouth daily., Disp: 30 Capsule, Rfl: 3   ?  white petrolatum-mineral oiL (Stye Lubricant) 57.7-31.9 % oint ointment, Administer 3.5 g to both eyes as needed (irritation)., Disp: 3.5 g, Rfl: 0   ?  atorvastatin (LIPITOR) 80 mg tablet, Take 80 mg by mouth., Disp: , Rfl:    ?  famotidine (Pepcid) 20 mg tablet, Take 20 mg by mouth., Disp: , Rfl:    ?  metoprolol tartrate (LOPRESSOR) 25 mg tablet, , Disp: , Rfl:    ?  clopidogreL (Plavix) 75 mg tab, Take 75 mg by mouth., Disp: , Rfl:    ?  aspirin 81 mg chewable tablet, Take 81 mg by mouth., Disp: , Rfl:    ?  colestipoL (COLESTID) 1 gram tablet, Take 2 g by mouth daily., Disp: , Rfl:             KIYONA MCNALL, was evaluated through a synchronous (real-time) audio-video encounter. The patient (or guardian if applicable) is aware that this is a billable service, which includes applicable  co-pays. This Virtual Visit was conducted with patient's (and/or legal guardian's) consent. The visit was conducted pursuant to the emergency declaration under the and the , 1135 waiver authority and the and Act.  Patient identification was verified, and a caregiver was present when appropriate. The patient was located in a state where the provider was licensed to provide care.         An electronic signature was used to authenticate this note.   -- 05-04-1993, MD

## 2020-06-14 ENCOUNTER — Encounter

## 2020-06-15 MED ORDER — VENLAFAXINE SR 37.5 MG 24 HR CAP
37.5 mg | ORAL_CAPSULE | ORAL | 0 refills | Status: DC
Start: 2020-06-15 — End: 2020-08-16

## 2020-07-03 ENCOUNTER — Encounter

## 2020-07-03 MED ORDER — COLESTIPOL 1 GRAM TAB
1 gram | ORAL_TABLET | Freq: Every day | ORAL | 1 refills | Status: DC
Start: 2020-07-03 — End: 2020-10-24

## 2020-07-03 NOTE — Telephone Encounter (Signed)
Telephone Encounter by Eugene Garnet, MD at 07/03/20 1330                Author: Eugene Garnet, MD  Service: --  Author Type: Physician       Filed: 07/03/20 1330  Encounter Date: 07/03/2020  Status: Signed          Editor: Eugene Garnet, MD (Physician)          From: Hulen ShoutsEugene Garnet, MDSent: 07/03/2020 12:57 PM EDTSubject: Refill - ColestipolHi Dr. Sanda Klein and welcome back!!!  I hope  you and your new little one are fantastic :)I need a refill on the Colestipol Tablets 1 GM Generic for Colestid 1 GM.It was originally prescribed for the Microscopic Colitis with me taking 3 tabs 2 x's /day.  It hass been backed down to just 2 tabs 1  x/day.  Yes, I still need to take these.  Tried not taking and I guess my body isn't ready for that.  Diarrhea started up again.Knock on wood, everything else seems to be good!Thanks and I'm sure I'll be seeing you at some point.  Stay healthy and safe!Best  Regards,Kristine Banks

## 2020-08-05 ENCOUNTER — Telehealth

## 2020-08-05 MED ORDER — ONDANSETRON 4 MG TAB, RAPID DISSOLVE
4 mg | ORAL_TABLET | Freq: Three times a day (TID) | ORAL | 0 refills | Status: DC | PRN
Start: 2020-08-05 — End: 2020-08-14

## 2020-08-05 NOTE — Telephone Encounter (Signed)
Returned call from on call service. Pt reports N/V, abd pain and diarrrhea that started on Thursday - most bothersome is nausea.  Intermittent watery diarrhea (denies melena or bloody stool). Abd pain is intermittent and comes on with diarrhea. Constant nausea and intermittent clear colored vomiting. Tolerating po intake - sips of fluids (electrolytes, broth, ginger ale). Temp 72F. Home rapid test was negative.    Discussed going to urgent care/ED for care, however nearest place is >43mins away. Sent Zofran rx. Discussed if sx worsen or not improve to go to urgent care/ED for further evaluation.     Pt also endorses chronic weight loss over last 2 years, which she has PCP appt scheduled for in ~2 weeks. Will send message to clinic staff to schedule visit for pt for Monday if possible.     Noemi Chapel, MD

## 2020-08-06 NOTE — Telephone Encounter (Signed)
Call made to both numbers on file, no answer, mess left.     Attempting to schedule an appt for pt for N, V, diarrhea. Please schedule

## 2020-08-07 ENCOUNTER — Ambulatory Visit: Attending: Student in an Organized Health Care Education/Training Program | Primary: Family Medicine

## 2020-08-07 ENCOUNTER — Ambulatory Visit
Admit: 2020-08-07 | Discharge: 2020-08-07 | Payer: PRIVATE HEALTH INSURANCE | Attending: Student in an Organized Health Care Education/Training Program | Primary: Family Medicine

## 2020-08-07 ENCOUNTER — Observation Stay
Admit: 2020-08-07 | Discharge: 2020-08-08 | Disposition: A | Discharge status: Home / Self Care | Payer: PRIVATE HEALTH INSURANCE | Attending: Family Medicine | Admitting: Family Medicine

## 2020-08-07 ENCOUNTER — Observation Stay: Admit: 2020-08-08 | Payer: PRIVATE HEALTH INSURANCE | Primary: Family Medicine

## 2020-08-07 DIAGNOSIS — R531 Weakness: Secondary | ICD-10-CM

## 2020-08-07 LAB — CBC WITH AUTOMATED DIFF
ABS. BASOPHILS: 0 10*3/uL (ref 0.0–0.1)
ABS. EOSINOPHILS: 0.1 10*3/uL (ref 0.0–0.4)
ABS. IMM. GRANS.: 0.1 10*3/uL — ABNORMAL HIGH (ref 0.00–0.04)
ABS. LYMPHOCYTES: 2.4 10*3/uL (ref 0.8–3.5)
ABS. MONOCYTES: 1.1 10*3/uL — ABNORMAL HIGH (ref 0.0–1.0)
ABS. NEUTROPHILS: 8.5 10*3/uL — ABNORMAL HIGH (ref 1.8–8.0)
ABSOLUTE NRBC: 0 10*3/uL (ref 0.00–0.01)
BASOPHILS: 0 % (ref 0–1)
EOSINOPHILS: 0 % (ref 0–7)
HCT: 39 % (ref 35.0–47.0)
HGB: 13.2 g/dL (ref 11.5–16.0)
IMMATURE GRANULOCYTES: 0 % (ref 0.0–0.5)
LYMPHOCYTES: 20 % (ref 12–49)
MCH: 30.6 PG (ref 26.0–34.0)
MCHC: 33.8 g/dL (ref 30.0–36.5)
MCV: 90.5 FL (ref 80.0–99.0)
MONOCYTES: 9 % (ref 5–13)
MPV: 9.7 FL (ref 8.9–12.9)
NEUTROPHILS: 71 % (ref 32–75)
NRBC: 0 PER 100 WBC
PLATELET: 392 10*3/uL (ref 150–400)
RBC: 4.31 M/uL (ref 3.80–5.20)
RDW: 13.5 % (ref 11.5–14.5)
WBC: 12.1 10*3/uL — ABNORMAL HIGH (ref 3.6–11.0)

## 2020-08-07 LAB — CBC WITH AUTO DIFFERENTIAL
Basophils %: 0 % (ref 0–1)
Basophils Absolute: 0 10*3/uL (ref 0.0–0.1)
Eosinophils %: 0 % (ref 0–7)
Eosinophils Absolute: 0.1 10*3/uL (ref 0.0–0.4)
Granulocyte Absolute Count: 0.1 10*3/uL — ABNORMAL HIGH (ref 0.00–0.04)
Hematocrit: 39 % (ref 35.0–47.0)
Hemoglobin: 13.2 g/dL (ref 11.5–16.0)
Immature Granulocytes: 0 % (ref 0.0–0.5)
Lymphocytes %: 20 % (ref 12–49)
Lymphocytes Absolute: 2.4 10*3/uL (ref 0.8–3.5)
MCH: 30.6 PG (ref 26.0–34.0)
MCHC: 33.8 g/dL (ref 30.0–36.5)
MCV: 90.5 FL (ref 80.0–99.0)
MPV: 9.7 FL (ref 8.9–12.9)
Monocytes %: 9 % (ref 5–13)
Monocytes Absolute: 1.1 10*3/uL — ABNORMAL HIGH (ref 0.0–1.0)
NRBC Absolute: 0 10*3/uL (ref 0.00–0.01)
Neutrophils %: 71 % (ref 32–75)
Neutrophils Absolute: 8.5 10*3/uL — ABNORMAL HIGH (ref 1.8–8.0)
Nucleated RBCs: 0 PER 100 WBC
Platelets: 392 10*3/uL (ref 150–400)
RBC: 4.31 M/uL (ref 3.80–5.20)
RDW: 13.5 % (ref 11.5–14.5)
WBC: 12.1 10*3/uL — ABNORMAL HIGH (ref 3.6–11.0)

## 2020-08-07 MED ORDER — ONDANSETRON (PF) 4 MG/2 ML INJECTION
4 mg/2 mL | Freq: Once | INTRAMUSCULAR | Status: AC
Start: 2020-08-07 — End: 2020-08-07
  Administered 2020-08-07: 23:00:00 via INTRAVENOUS

## 2020-08-07 MED ORDER — ACETAMINOPHEN 325 MG TABLET
325 mg | ORAL | Status: AC
Start: 2020-08-07 — End: 2020-08-07
  Administered 2020-08-07: 23:00:00 via ORAL

## 2020-08-07 MED ORDER — SODIUM CHLORIDE 0.9% BOLUS IV
0.9 % | Freq: Once | INTRAVENOUS | Status: AC
Start: 2020-08-07 — End: 2020-08-07
  Administered 2020-08-07: 23:00:00 via INTRAVENOUS

## 2020-08-07 MED FILL — ONDANSETRON (PF) 4 MG/2 ML INJECTION: 4 mg/2 mL | INTRAMUSCULAR | Qty: 2

## 2020-08-07 MED FILL — SODIUM CHLORIDE 0.9 % IV: INTRAVENOUS | Qty: 1000

## 2020-08-07 MED FILL — ACETAMINOPHEN 325 MG TABLET: 325 mg | ORAL | Qty: 2

## 2020-08-07 NOTE — ED Notes (Signed)
Patient reports she started with fever, vomiting and headache on Thursday- was evaluated yesterday at [facility] and discharged.    Patient reports she went to a PCP in [place] today and was referred here for evaluation of dehydration and altered mental status

## 2020-08-07 NOTE — Progress Notes (Signed)
1. "Have you been to the ER, urgent care clinic since your last visit?  Hospitalized since your last visit?" Yes When: [facility]     2. "Have you seen or consulted any other health care providers outside of the [facility] since your last visit?" No     3. For patients aged 65-75: Has the patient had a colonoscopy / FIT/ Cologuard? NA - based on age      If the patient is female:    4. For patients aged 52-74: Has the patient had a mammogram within the past 2 years? Yes - no Care Gap present      5. For patients aged 21-65: Has the patient had a pap smear? No    Health Maintenance Due   Topic Date Due   . Cervical cancer screen  05/07/2020   . Lipid Screen  07/10/2020

## 2020-08-07 NOTE — Progress Notes (Signed)
I saw and evaluated the patient, performing the key elements of the service.  I discussed the findings, assessment and plan with the resident and agree with the resident's findings and plan as documented in the resident's note.

## 2020-08-07 NOTE — ED Notes (Signed)
Pt c/o headache 8/10. Denies any nausea or abd pain. Medicated with Fioricet.

## 2020-08-07 NOTE — ED Provider Notes (Signed)
65 year old female presents from her PCPs office with complaints of nausea, vomiting, diarrhea.  She started to feel sick several days ago.  She reports chills cold sweats.  No cough or trouble breathing.  No urinary symptoms.  She took Phenergan at home but without relief.  She presented to [facility] yesterday for the symptoms and was discharged home.  Today she was feeling worse which prompted her visit to her PCPs office.  She was referred to the ER for likely admission.  Past medical history significant for hypertension, pancreatitis,.  No known sick contacts.           Past Medical History:   Diagnosis Date   ??? Anxiety    ??? Depression    ??? Hypertension    ??? Myocardial infarction [facility])    ??? Pancreatitis 2004   ??? Recovering alcoholic [facility])        Past Surgical History:   Procedure Laterality Date   ??? HX BREAST AUGMENTATION Bilateral 2011   ??? HX BUNIONECTOMY     ??? HX CHOLECYSTECTOMY  2021         Family History:   Problem Relation Age of Onset   ??? Hypertension Mother    ??? Cancer Mother         glioblastoma   ??? Thyroid Disease Mother         Unsure if overactive or underactive   ??? Thyroid Disease Brother    ??? Hypertension Brother    ??? Other Brother         Factor V Leiden       Social History     Socioeconomic History   ??? Marital status: MARRIED     Spouse name: Not on file   ??? Number of children: Not on file   ??? Years of education: Not on file   ??? Highest education level: Not on file   Occupational History   ??? Not on file   Tobacco Use   ??? Smoking status: Former Smoker   ??? Smokeless tobacco: Never Used   ??? Tobacco comment: vape   Substance and Sexual Activity   ??? Alcohol use: Not on file   ??? Drug use: Not on file   ??? Sexual activity: Not on file   Other Topics Concern   ??? Not on file   Social History Narrative   ??? Not on file     Social Determinants of Health     Financial Resource Strain:    ??? Difficulty of Paying Living Expenses: Not on file   Food Insecurity:    ??? Worried About Running Out of Food in the Last Year:  Not on file   ??? Ran Out of Food in the Last Year: Not on file   Transportation Needs:    ??? Lack of Transportation (Medical): Not on file   ??? Lack of Transportation (Non-Medical): Not on file   Physical Activity:    ??? Days of Exercise per Week: Not on file   ??? Minutes of Exercise per Session: Not on file   Stress:    ??? Feeling of Stress : Not on file   Social Connections:    ??? Frequency of Communication with Friends and Family: Not on file   ??? Frequency of Social Gatherings with Friends and Family: Not on file   ??? Attends Religious Services: Not on file   ??? Active Member of Clubs or Organizations: Not on file   ??? Attends Club or Organization  Meetings: Not on file   ??? Marital Status: Not on file   Intimate Partner Violence:    ??? Fear of Current or Ex-Partner: Not on file   ??? Emotionally Abused: Not on file   ??? Physically Abused: Not on file   ??? Sexually Abused: Not on file   Housing Stability:    ??? Unable to Pay for Housing in the Last Year: Not on file   ??? Number of Places Lived in the Last Year: Not on file   ??? Unstable Housing in the Last Year: Not on file         ALLERGIES: Ragweed pollen    Review of Systems   Constitutional: Positive for chills.   HENT: Negative for facial swelling.    Eyes: Negative for visual disturbance.   Respiratory: Negative for chest tightness.    Cardiovascular: Negative for chest pain.   Gastrointestinal: Positive for abdominal pain, diarrhea and vomiting.   Genitourinary: Negative for difficulty urinating and dysuria.   Musculoskeletal: Negative for arthralgias.   Skin: Negative for rash.   Neurological: Negative for headaches.   Hematological: Negative for adenopathy.   Psychiatric/Behavioral: Negative for suicidal ideas.       Vitals:    08/07/20 1836 08/07/20 1925   BP: 120/69    Pulse: 78 75   Resp: 16    Temp: 97.3 ??F (36.3 ??C)    SpO2: 100%    Weight: 49.4 kg (109 lb)    Height: 5\' 6"  (1.676 m)             Physical Exam  Vitals and nursing note reviewed.   Constitutional:        General: She is not in acute distress.     Appearance: She is well-developed.   HENT:      Head: Normocephalic and atraumatic.   Eyes:      General: No scleral icterus.     Conjunctiva/sclera: Conjunctivae normal.      Pupils: Pupils are equal, round, and reactive to light.   Cardiovascular:      Rate and Rhythm: Normal rate.      Heart sounds: No murmur heard.      Pulmonary:      Effort: Pulmonary effort is normal. No respiratory distress.   Abdominal:      General: There is no distension.   Musculoskeletal:         General: Normal range of motion.      Cervical back: Normal range of motion and neck supple.   Skin:     General: Skin is warm and dry.      Findings: No rash.   Neurological:      Mental Status: She is alert and oriented to person, place, and time.          MDM  Number of Diagnoses or Management Options     Amount and/or Complexity of Data Reviewed  Clinical lab tests: reviewed  Tests in the medicine section of CPT??: reviewed      ED Course as of 08/07/20 2213   Tue Aug 07, 2020   2212 ED EKG interpretation:  Rhythm: normal sinus rhythm. Rate (approx.): 71.  Axis: normal.  ST segment:  No concerning ST elevations or depressions. This EKG was interpreted by Aug 09, 2020, MD,ED Provider.     [JM]      ED Course User Index  [JM] Domingo Cocking, MD     Perfect Serve Consult for Admission  8:27 PM    ED Room Number: ER08/08  Patient Name and age:  Kristine Banks 65 y.o.  female  Working Diagnosis:   1. Vomiting, unspecified vomiting type, unspecified whether nausea present    2. Dehydration    3. Acute renal insufficiency        COVID-19 Suspicion:  no  Sepsis present:  no  Reassessment needed: no  Code Status:  Full Code  Readmission: yes  Isolation Requirements:  no  Recommended Level of Care:  med/surg  Department:  [facility] - [phone]  Admitting Provider: Family Med    Other:  Vomiting, dehydration    Total critical care time spent exclusive of procedures:  0  minutes.    Procedures

## 2020-08-07 NOTE — H&P (Signed)
H&P by Gerrit Halls, MD at  08/07/20 2038                Author: Gerrit Halls, MD  Service: FAMILY MEDICINE  Author Type: Resident       Filed: Aug 11, 2020 0428  Date of Service: 08/07/20 2038  Status: Attested Addendum          Editor: Gerrit Halls, MD (Resident)       Related Notes: Original Note by Gerrit Halls, MD (Resident) filed at 08-11-2020 [phone]          Cosigner: Guilford Shi, MD at Aug 11, 2020 2319          Attestation signed by Guilford Shi, MD at 2020-08-11 [facility] Attending Addendum:   I saw and evaluated the patient, performing the key elements of the service.  I discussed the findings, assessment and plan with the resident and agree with the resident's findings and plan as documented  in the resident's note.      The patient was seen on Aug 11, 2020      Vitals:        08-11-2020 0942  2020-08-11 1038  08/11/2020 1136  08/11/2020 1520      BP:    (!) 153/76  139/64        Pulse:      73        Resp:      18        Temp:      98 ??F (36.7 ??C)        SpO2:      100%        Weight:        113 lb 5.1 oz (51.4 kg)      Height:  '5\' 6"'  (1.676 m)                  Patient alert, oriented, able to speak in full sentences.  Expresses frustration regarding AMS.         Assessment/Plan:    Acute metabolic encephalopathy: Significant improvement since presentation at PCPs office.  Able to speak clearly, is alert and oriented.  S   an urgent MRI was not indicated, but should her sx return or other clinical signs present, an MRI could be performed.  Outpatient follow-up with neurology may also be beneficial to explore these concerns.    Gastroenteritis with associated dehydration:  Clinically dehydrated on presentation to the ED.  Received IVF and antiemetics with improvement in  sx and was able to tolerate a regular diet before discharge.  Renal function returned to baseline.  Discharged home with phenergan as this is most effective for her.    Non-cardiac troponin  elevation in pt with CAD: Minimal elevation and improvement with IVF.  EKG without evidence of ischemia or infarction.    Hospital Problems   Date Reviewed: Aug 11, 2020                       Codes  Class  Noted  POA        * (Principal) Acute metabolic encephalopathy  MVH-84-ON: G93.41   ICD-9-CM: 348.31    08/11/20  Unknown                Dehydration  ICD-10-CM: E86.0   ICD-9-CM: 276.51    08/07/2020  Unknown  Elevated troponin  ICD-10-CM: R77.8   ICD-9-CM: 790.6    08/08/2020  Unknown                Primary hypertension (Chronic)  ICD-10-CM: I10   ICD-9-CM: 401.9    08/08/2020  Yes                Depression (Chronic)  ICD-10-CM: F32.A   ICD-9-CM: 784    08/08/2020  Yes                Gastroenteritis  ICD-10-CM: K52.9   ICD-9-CM: 558.9    08/08/2020  Yes                Coronary artery disease involving native coronary artery of native heart without angina pectoris  ICD-10-CM: I25.10   ICD-9-CM: 414.01    05/29/2020  Yes        Overview Signed 05/29/2020  9:28 AM by Vivianne Master, MD          LHC 02/25/20: Mild nonobstructive CAD. 20-30% ostial RCA. 10-20% distal RCA. 30-40% mid LAD. 20% ostial LCx                                                                                       [place] [address]    Office [phone]   Fax [phone]             Admission H&P            Name: Kristine Banks  MRN: [id number]   Sex: Female         Date of Birth: May 31, 1955   Age: 65 y.o.   PCP: Mayer Camel, MD        Source of Information: patient, medical records      Chief complaint: Nausea and vomiting      History of Present Illness   Kristine Banks is a 65 y.o.  female with PMHx of  CAD, HTN, anxiety/depression, substance use who presents to the ED at request of her PCP for evaluation of dehydration and AMS in the setting of nausea and vomiting.        Patient states that she has been experiencing nausea and vomiting for the last 6 days. She was seen in [facility] ER on 5/16 where she  had an unremarkable abdominal CT, was diagnosed with viral gastroenteritis and sent home with Zofran, Phenergan and Bentyl.  Patient also noted to have elevated BP to 200s at that time, started on Lisinopril 2.43m daily.       On presentation to PCP earlier today for follow up, was recommended to present to ER due to inability to tolerate PO and hyponatremia (130), and confusion.       Patient states she has continued to feel weak since that time. She states she has been tolerating fluids and soup with the use of Phenergan. However she has noted a worsening headache, as well some instances of confusion. She states she suffered some  garbled speech and inability "to find the right words" earlier today, but no issues since. She has not taken anything for  her headache that started today, no history of migraines, not worsened by light. Denies any syncope, head trauma, seizure activity,  or vision change.       In the ED:   Vitals: Temp 97.3   BP 120/69   HR 75   RR 16   SatO2  100% on RA   Labs: WBC 12.1, Na 137, Cr 1.2 (bl ~1.0), Trop 52, Lipase 60.   Imaging: None   Treatment: Tylenol, Zofran, 1L NS      EKG: NSR, regular rate, QTc 430, no signs of ischemia      Patient Vitals for the past 12 hrs:            Temp  Pulse  Resp  BP  SpO2            08/07/20 2304  --  79  --  --  --            08/07/20 2237  --  --  --  --  97 %     08/07/20 2230  --  89  14  (!) 133/47  100 %     08/07/20 2130  --  76  9  (!) 161/57  97 %     08/07/20 2115  --  89  10  (!) 143/47  100 %     08/07/20 2100  --  89  14  (!) 158/80  99 %     08/07/20 2045  --  82  9  (!) 137/49  100 %     08/07/20 2030  --  76  10  (!) 153/55  100 %     08/07/20 2015  --  --  --  --  97 %     08/07/20 1925  --  75  --  --  --            08/07/20 1836  97.3 ??F (36.3 ??C)  78  16  120/69  100 %           Review of Systems   Review of Systems    Constitutional: Positive for activity change and appetite change . Negative for chills and fever.    HENT: Negative  for congestion, rhinorrhea and sinus pain.     Eyes: Negative for photophobia and visual disturbance.    Respiratory: Negative for apnea, chest tightness and shortness of breath.     Cardiovascular: Negative for chest pain.    Gastrointestinal: Positive for abdominal pain, diarrhea  (resolved), nausea  and vomiting.    Endocrine: Negative for polyuria.    Genitourinary: Negative for dysuria.    Musculoskeletal: Negative for back pain and myalgias.    Skin: Negative for rash and wound.    Neurological: Positive for speech difficulty (resolved) , weakness (resolved)  and headaches. Negative for dizziness, tremors, seizures, syncope, facial asymmetry and numbness.    Psychiatric/Behavioral: Positive for confusion (resolved) . Negative for agitation.          Home Medications      Prior to Admission medications             Medication  Sig  Start Date  End Date  Taking?  Authorizing Provider            dicyclomine (BENTYL) 20 mg tablet  Take 20 mg by mouth every six (6) hours as needed for Abdominal Cramps.        Provider, Historical  lisinopriL (PRINIVIL, ZESTRIL) 5 mg tablet  Take 2.5 mg by mouth daily.        Provider, Historical     promethazine (PHENERGAN) 25 mg suppository  Insert 25 mg into rectum every six (6) hours as needed for Nausea.        Provider, Historical     ondansetron (ZOFRAN ODT) 4 mg disintegrating tablet  Take 1 Tablet by mouth every eight (8) hours as needed for Nausea or Vomiting.  08/05/20      Wendee Copp, MD     colestipoL (COLESTID) 1 gram tablet  Take 2 Tablets by mouth daily.  07/03/20      Mayer Camel, MD     venlafaxine-[facility]) 37.5 mg capsule  TAKE ONE CAPSULE BY MOUTH EVERY DAY  06/15/20      Margaretmary Bayley, [place], DO     white petrolatum-mineral oiL (Stye Lubricant) 57.7-31.9 % oint ointment  Administer 3.5 g to both eyes as needed (irritation).  03/13/20      [name], DO     atorvastatin (LIPITOR) 80 mg tablet  Take 80 mg by mouth.   Patient not taking: Reported on  08/07/2020  02/26/20      Provider, Historical     famotidine (Pepcid) 20 mg tablet  Take 20 mg by mouth.  02/26/20      Provider, Historical     metoprolol tartrate (LOPRESSOR) 25 mg tablet  Take 25 mg by mouth two (2) times a day.  02/26/20      Provider, Historical     clopidogreL (Plavix) 75 mg tab  Take 75 mg by mouth.  02/26/20      Provider, Historical            aspirin 81 mg chewable tablet  Take 81 mg by mouth.  02/26/20      Provider, Historical           Allergies     Allergies        Allergen  Reactions         ?  Ragweed Pollen  Sneezing           Past Medical History     Past Medical History:        Diagnosis  Date         ?  Anxiety       ?  Depression       ?  Hypertension       ?  Myocardial infarction [facility])       ?  Pancreatitis  2004         ?  Recovering alcoholic ([place])             Previous Hospitalization(s)     Past Surgical History:         Procedure  Laterality  Date          ?  HX BREAST AUGMENTATION  Bilateral  2011     ?  HX BUNIONECTOMY              ?  HX CHOLECYSTECTOMY    2021           Family History     Family History         Problem  Relation  Age of Onset          ?  Hypertension  Mother       ?  Cancer  Mother  glioblastoma          ?  Thyroid Disease  Mother                Marena Chancy if overactive or underactive          ?  Thyroid Disease  Brother       ?  Hypertension  Brother       ?  Other  Brother                Factor V Leiden           Social History   Alcohol history: Prior alcoholic, sober 17 years   Smoking history: Vapes occasionally   Illicit drug history: Some THC vaping, some THC gummy use for sleep   Living arrangement: patient lives with their spouse.   Ambulates: Independently       Vital Signs   Visit Vitals      BP  (!) 133/47     Pulse  79     Temp  97.3 ??F (36.3 ??C)     Resp  14     Ht  '5\' 6"'  (1.676 m)     Wt  109 lb (49.4 kg)     SpO2  97%        BMI  17.59 kg/m??           Physical Exam      General:  Uncomfortable, complaining of headache. Alert.  Cooperative.     Head:  Normocephalic. Atraumatic.     Eyes:               Conjunctiva pink. Sclera white. PERRL.     Ears:               Hearing grossly intact.     Nose:              Septum midline. Mucosa pink. No drainage.        Throat:  Mucosa pink. Dry mucous membranes..        Neck:  Supple. Normal ROM. No stiffness.     Respiratory:  CTAB. No w/r/r/c.     Cardiovascular:  RRR. Normal S1,S2. No m/r/g.      GI:  + bowel sounds. Mildly tender epigastric.Marland Kitchen No rebound tenderness or guarding. Nondistended.     Extremities:  No LE edema. Distal pulses intact.         Musculoskeletal:  Full ROM in all extremities.         Skin:  Warm, dry. No rashes.         Neuro:  CN II-XII grossly intact. Strength 5/5 in all extremities. Sensation intact.           Laboratory Data     Recent Results (from the past 8 hour(s))     CBC WITH AUTOMATED DIFF          Collection Time: 08/07/20  7:03 PM         Result  Value  Ref Range            WBC  12.1 (H)  3.6 - 11.0 K/uL       RBC  4.31  3.80 - 5.20 M/uL       HGB  13.2  11.5 - 16.0 g/dL       HCT  39.0  35.0 - 47.0 %       MCV  90.5  80.0 - 99.0 FL  MCH  30.6  26.0 - 34.0 PG       MCHC  33.8  30.0 - 36.5 g/dL       RDW  13.5  11.5 - 14.5 %       PLATELET  392  150 - 400 K/uL       MPV  9.7  8.9 - 12.9 FL       NRBC  0.0  0 PER 100 WBC       ABSOLUTE NRBC  0.00  0.00 - 0.01 K/uL       NEUTROPHILS  71  32 - 75 %       LYMPHOCYTES  20  12 - 49 %       MONOCYTES  9  5 - 13 %       EOSINOPHILS  0  0 - 7 %       BASOPHILS  0  0 - 1 %       IMMATURE GRANULOCYTES  0  0.0 - 0.5 %       ABS. NEUTROPHILS  8.5 (H)  1.8 - 8.0 K/UL       ABS. LYMPHOCYTES  2.4  0.8 - 3.5 K/UL       ABS. MONOCYTES  1.1 (H)  0.0 - 1.0 K/UL       ABS. EOSINOPHILS  0.1  0.0 - 0.4 K/UL       ABS. BASOPHILS  0.0  0.0 - 0.1 K/UL       ABS. IMM. GRANS.  0.1 (H)  0.00 - 0.04 K/UL       DF  AUTOMATED          METABOLIC PANEL, COMPREHENSIVE          Collection Time: 08/07/20  7:03 PM         Result  Value  Ref Range             Sodium  137  136 - 145 mmol/L       Potassium  3.8  3.5 - 5.1 mmol/L       Chloride  99  97 - 108 mmol/L       CO2  29  21 - 32 mmol/L       Anion gap  9  5 - 15 mmol/L       Glucose  98  65 - 100 mg/dL       BUN  17  6 - 20 MG/DL       Creatinine  1.20 (H)  0.55 - 1.02 MG/DL       BUN/Creatinine ratio  14  12 - 20         GFR est AA  55 (L)  >60 ml/min/1.39m       GFR est non-AA  45 (L)  >60 ml/min/1.721m      Calcium  9.0  8.5 - 10.1 MG/DL       Bilirubin, total  0.6  0.2 - 1.0 MG/DL       ALT (SGPT)  26  12 - 78 U/L       AST (SGOT)  17  15 - 37 U/L       Alk. phosphatase  61  45 - 117 U/L       Protein, total  6.5  6.4 - 8.2 g/dL       Albumin  3.1 (L)  3.5 - 5.0 g/dL       Globulin  3.4  2.0 - 4.0 g/dL       A-G Ratio  0.9 (L)  1.1 - 2.2         LIPASE          Collection Time: 08/07/20  7:03 PM         Result  Value  Ref Range            Lipase  60 (L)  73 - 393 U/L       MAGNESIUM          Collection Time: 08/07/20  7:03 PM         Result  Value  Ref Range            Magnesium  2.2  1.6 - 2.4 mg/dL       TROPONIN-HIGH SENSITIVITY          Collection Time: 08/07/20  7:03 PM         Result  Value  Ref Range            Troponin-High Sensitivity  52 (H)  0 - 51 ng/L       EKG, 12 LEAD, INITIAL          Collection Time: 08/07/20  7:16 PM         Result  Value  Ref Range            Ventricular Rate  72  BPM       Atrial Rate  72  BPM       P-R Interval  150  ms       QRS Duration  64  ms       Q-T Interval  400  ms       QTC Calculation (Bezet)  438  ms       Calculated P Axis  53  degrees       Calculated R Axis  53  degrees       Calculated T Axis  35  degrees       Diagnosis                 Normal sinus rhythm   Anteroseptal infarct , age undetermined   Abnormal ECG   No previous ECGs available          TROPONIN-HIGH SENSITIVITY          Collection Time: 08/07/20 10:18 PM         Result  Value  Ref Range            Troponin-High Sensitivity  46  0 - 51 ng/L       URINE CULTURE HOLD SAMPLE           Collection Time: 08/08/20 12:08 AM       Specimen: Serum; Urine         Result  Value  Ref Range            Urine culture hold                  Urine on hold in Microbiology dept for 2 days.  If unpreserved urine is submitted, it cannot be used for addtional testing after 24 hours, recollection  will be required.           Imaging     CXR Results   (Last 48 hours)          None  CT Results   (Last 48 hours)                                    08/07/20 2159    CT HEAD WO CONT  Final result            Impression:           No acute process.                       Narrative:    INDICATION: AMS             EXAM:  HEAD CT WITHOUT CONTRAST             COMPARISON: None             TECHNIQUE:  Routine noncontrast axial head CT was performed.  Sagittal and      coronal reconstructions were generated.               CT dose reduction was achieved through use of a standardized protocol tailored      for this examination and automatic exposure control for dose modulation.             FINDINGS:             Ventricles: Midline, no hydrocephalus.      Intracranial Hemorrhage: None.      Brain Parenchyma/Brainstem: Normal for age.      Basal Cisterns: Normal.      Paranasal Sinuses: Visualized sinuses are clear.      Additional Comments: N/A.                                       Assessment and Plan        Kristine Banks is a 65 y.o.  female with a PMHx of CAD, HTN, anxiety/depression, substance use who is admitted for dehydration in the setting of likely gastroenteritis.         Nausea/ Vomiting/ Dehydration: Likely due to viral gastroenteritis, negative workup at [facility] on 5/16 including CT abdomen unremarkable. Currently  tolerating some fluids with home nausea meds. Lipase normal.    - Admit to observation, vitals per protocol   - Clear liquid diet, ADAT   - Zofran IV q4 PRN for nausea. Advance to PO phenergan tomorrow if tolerating. Patient states she does not do well with PO Zofran.   - S/p 1L bolus in ED. 500cc  bolus now, start MIVF @ 183m/hr.    - strict IOs   - Daily CMP, CBC   - home Bentyl and Pepcid   - SCDs       Altered Mental Status: No focal deficits. Some confusion which has now fully resolved, likely in the setting of acute illness and dehydration. Very low concern for stroke. [facility] Limited additional work up at this time due to low concern. Electrolytes wnl.   - Head CT   - Fall precautions   - If fails to improve overnight, will consider further AMS workup tomorrow.        Elevated Troponin: POA 52, likely in setting of dehydration. EKG wnl   - repeat in 2 hours.       Elevated Cr: POA 1.2,  bl ~1.0. Likley due to poor PO intake. S/p 1L NS in ED.   - additional 500cc bolus now   - MIVF, 189m/hr      Headache: Likely due to dehydration. No history of migraines. No photophobia.   - Fioricet q6 PRN      CAD/ s/p MI: s/p Cath in December 2021 after [place].    - Continue Atorvastatin 849mand Metoprolol 2531mID   - Restart ASA  after CT head results. States not taking Plavix.   - will need cardiology follow up, currently does not have a cardiologist after the cath      HTN: chronic   - continue home Lisinopril 2.5mg64mecently started at [facility]     Depression/ Anxiety: chronic   - continue home Effexor 37.5mg 8mly      Hx Microscopic Colitis: chronic   - Colestipol 2g daily          FEN/GI - Clear liquid diet.ADAT,  NS at 100 mL/hr.   Activity - Ambulate with assistance   DVT prophylaxis - SCDs   GI prophylaxis - Not indicated at this time   Fall prophylaxis - Fall precautions ordered .   Disposition - Admit to Observation . Plan to d/c to Home. Consulting : None   Code Status - Full. Discussed  with patient / caregivers.   Next of Kin Name and [place]  - [phone] Patient ElainCHESSIE NEUHARTH be discussed with Dr.   JelisGwendolyn Lima  8:38 PM, 08/08/20   JosepGerrit Halls  Family Medicine Resident, PGY1              For Billing          Chief Complaint      Patient presents with      ?  Vomiting      ?  Headache               Hospital Problems   Date Reviewed:  12/20/08-01-2020            Codes  Class  Noted  POA        Dehydration  ICD-10-CM: E86.0   ICD-9-CM: 276.51    08/07/2020  Unknown                Vomiting  ICD-10-CM: R11.10   ICD-9-CM: 787.03    08/07/2020  Unknown                Nausea & vomiting  ICD-10-CM: R11.2   ICD-9-CM: 787.01    08/07/2020  Unknown

## 2020-08-07 NOTE — Progress Notes (Signed)
Subjective  Kristine Banks is an 65 y.o. female who presents for worsening weakness, nausea, decreased PO intake and altered mental status.    Patient presented to [facility] ER yesterday with nausea, vomiting and decreased PO. Imaging was neg. Labs showed ketonuria, leukocytosis and mild hyponatremia. Did not pass PO trial but was discharged anyway. Had HTN in severe range that was not addressed. Symptoms worsening since then. She has had episodes of confusion and dysarthria today while she was on the phone with her daughter which prompted patients spouse to bring her to [facility].    Currently endorses nausea, abd pain. Denies CP, SOB. Endorses HA.    Allergies - reviewed:   Allergies   Allergen Reactions   ??? Ragweed Pollen Sneezing         Medications - reviewed:   Current Outpatient Medications   Medication Sig   ??? dicyclomine (BENTYL) 20 mg tablet Take 20 mg by mouth every six (6) hours as needed for Abdominal Cramps.   ??? lisinopriL (PRINIVIL, ZESTRIL) 5 mg tablet Take 2.5 mg by mouth daily.   ??? promethazine (PHENERGAN) 25 mg suppository Insert 25 mg into rectum every six (6) hours as needed for Nausea.   ??? ondansetron (ZOFRAN ODT) 4 mg disintegrating tablet Take 1 Tablet by mouth every eight (8) hours as needed for Nausea or Vomiting.   ??? colestipoL (COLESTID) 1 gram tablet Take 2 Tablets by mouth daily.   ??? venlafaxine-SR (EFFEXOR-XR) 37.5 mg capsule TAKE ONE CAPSULE BY MOUTH EVERY DAY   ??? famotidine (Pepcid) 20 mg tablet Take 20 mg by mouth.   ??? metoprolol tartrate (LOPRESSOR) 25 mg tablet Take 25 mg by mouth two (2) times a day.   ??? clopidogreL (Plavix) 75 mg tab Take 75 mg by mouth.   ??? aspirin 81 mg chewable tablet Take 81 mg by mouth.   ??? white petrolatum-mineral oiL (Stye Lubricant) 57.7-31.9 % oint ointment Administer 3.5 g to both eyes as needed (irritation).   ??? atorvastatin (LIPITOR) 80 mg tablet Take 80 mg by mouth. (Patient not taking: Reported on 08/07/2020)     No current facility-administered  medications for this visit.         Past Medical History - reviewed:  Past Medical History:   Diagnosis Date   ??? Anxiety    ??? Depression    ??? Hypertension    ??? Myocardial infarction [facility])    ??? Pancreatitis 2004   ??? Recovering alcoholic [facility])          Past Surgical History - reviewed:   Past Surgical History:   Procedure Laterality Date   ??? HX BREAST AUGMENTATION Bilateral 2011   ??? HX BUNIONECTOMY     ??? HX CHOLECYSTECTOMY  2021         Social History - reviewed:  Social History     Socioeconomic History   ??? Marital status: MARRIED     Spouse name: Not on file   ??? Number of children: Not on file   ??? Years of education: Not on file   ??? Highest education level: Not on file   Occupational History   ??? Not on file   Tobacco Use   ??? Smoking status: Former Smoker   ??? Smokeless tobacco: Never Used   ??? Tobacco comment: vape   Substance and Sexual Activity   ??? Alcohol use: Not on file   ??? Drug use: Not on file   ??? Sexual activity: Not on file  Other Topics Concern   ??? Not on file   Social History Narrative   ??? Not on file     Social Determinants of Health     Financial Resource Strain:    ??? Difficulty of Paying Living Expenses: Not on file   Food Insecurity:    ??? Worried About Running Out of Food in the Last Year: Not on file   ??? Ran Out of Food in the Last Year: Not on file   Transportation Needs:    ??? Lack of Transportation (Medical): Not on file   ??? Lack of Transportation (Non-Medical): Not on file   Physical Activity:    ??? Days of Exercise per Week: Not on file   ??? Minutes of Exercise per Session: Not on file   Stress:    ??? Feeling of Stress : Not on file   Social Connections:    ??? Frequency of Communication with Friends and Family: Not on file   ??? Frequency of Social Gatherings with Friends and Family: Not on file   ??? Attends Religious Services: Not on file   ??? Active Member of Clubs or Organizations: Not on file   ??? Attends Banker Meetings: Not on file   ??? Marital Status: Not on file   Intimate Partner  Violence:    ??? Fear of Current or Ex-Partner: Not on file   ??? Emotionally Abused: Not on file   ??? Physically Abused: Not on file   ??? Sexually Abused: Not on file   Housing Stability:    ??? Unable to Pay for Housing in the Last Year: Not on file   ??? Number of Places Lived in the Last Year: Not on file   ??? Unstable Housing in the Last Year: Not on file         Family History - reviewed:  Family History   Problem Relation Age of Onset   ??? Hypertension Mother    ??? Cancer Mother         glioblastoma   ??? Thyroid Disease Mother         Unsure if overactive or underactive   ??? Thyroid Disease Brother    ??? Hypertension Brother    ??? Other Brother         Factor V Leiden         Immunizations - reviewed:   Immunization History   Administered Date(s) Administered   ??? COVID-19, J&J, PF, 0.5 mL Dose 06/22/2019   ??? COVID-19, Pfizer Purple top, DILUTE for use, 12+ yrs, 65mcg/0.3mL dose 02/03/2020   ??? Influenza Vaccine 01/22/2018, 02/03/2020   ??? Influenza Vaccine [employer]) PF (>6 Mo Flulaval, Fluarix, and >3 Yrs Afluria, Fluzone 27062) 01/11/2018, 01/12/2019   ??? Tdap 05/26/2018   ??? Zoster Recombinant 04/14/2018, 09/01/2018         ROS  In HPI      Physical Exam  Visit Vitals  BP 93/62 (BP 1 Location: Left upper arm, BP Patient Position: Sitting, BP Cuff Size: Adult)   Pulse 78   Temp 97.3 ??F (36.3 ??C) (Oral)   Resp 26   Ht 5' 5.5" (1.664 m)   Wt 109 lb (49.4 kg)   SpO2 96%   BMI 17.86 kg/m??       General appearance - alert. In distress d/t pain and nausea. Very weak appearing compared to baseline  Neck - supple, no significant adenopathy  Chest - clear to auscultation, no wheezes, rales or rhonchi, symmetric  air entry  Heart - normal rate, regular rhythm, normal S1, S2, no murmurs, rubs, clicks or gallops  Abdomen - soft, nontender, nondistended, no masses or organomegaly  Neurological - alert, oriented, normal speech, no focal findings or movement disorder noted  Skin - normal coloration and turgor, no rashes, no suspicious skin lesions  noted      Assessment/Plan    ICD-10-CM ICD-9-CM    1. Weakness  R53.1 780.79    2. Dehydration  E86.0 276.51    3. Decreased oral intake  R63.8 783.9    4. Altered mental status, unspecified altered mental status type  R41.82 780.97    Weakness 2/2 decreased PO intake:  - Sent to ER for eval for admission  - Will need to pass PO trial before discharge  - Will need electrolytes corrected before discharge    AMS: concerning for stroke with concurrent severe HTN though BP at baseline today  - Will need AMS and stroke workup      Follow-up and Dispositions    ?? Return in about 1 week (around 08/14/2020) for after discharge.           I have discussed the diagnosis with the patient and the intended plan as seen in the above orders. Patient verbalized understanding of the plan and agrees with the plan. The patient has received an after-visit summary and questions were answered concerning future plans.  I have discussed medication side effects and warnings with the patient as well. Informed patient to return to the office if new symptoms arise.        Renelda Loma, MD  Family Medicine Resident

## 2020-08-07 NOTE — Progress Notes (Signed)
Progress  Notes by Osie Cheeks, DO at 08/07/20 2037                Author: Osie Cheeks, DO  Service: FAMILY MEDICINE  Author Type: Resident       Filed: 08/08/20 0430  Date of Service: 08/07/20 2037  Status: Attested Addendum          Editor: Osie Cheeks, DO (Resident)       Related Notes: Original Note by Osie Cheeks, DO (Resident) filed at 08/07/20 2137          Cosigner: Guilford Shi, MD at 08/08/20 2320          Attestation signed by Guilford Shi, MD at 08/08/20 [facility] Attending Addendum:   I saw and evaluated the patient on the day of the encounter, performing the key elements of the service.  I discussed the findings, assessment and plan with the resident and agree with the resident's  findings and plan as documented in the resident's note.  See H&P for full attending attestation                                  [facility]    Senior Resident Admission Note      CC: headache/nausea      HPI:   Kristine Banks is a 65 y.o. female who presents after referral by PCP for weakness and altered mentation in the setting of recent diarrhea/nausea with decreased po intake. Seen at [facility] 5/16 and  diagnosed with viral gastroenteritis, discharged with bentyl/zofran and lisinopril as patient had SBP of 202 at that time. Reports one episode earlier today where she felt "out of it" and was unable to find words while speaking to daughter, no episodes  since. Headache generalized, not worsened by light/sound, started today.      Chart reviewed. Patient seen, examined, and discussed with Dr. Lovette Cliche (PGY-1). See H&P note for more details.      PE:   Physical Exam   Constitutional :        Appearance: Normal appearance.      Comments: Thin     Eyes :       Extraocular Movements: Extraocular movements intact.      Conjunctiva/sclera: Conjunctivae normal.      Pupils: Pupils are equal, round, and reactive to light.    Cardiovascular :       Rate and Rhythm: Normal rate and regular rhythm.      Heart sounds: Normal heart sounds.    Pulmonary:       Effort: Pulmonary effort is normal.      Breath sounds: Normal breath sounds.   Abdominal :      General: Abdomen is flat. Bowel sounds are normal. There is no distension.      Palpations: Abdomen is soft.      Tenderness: There is abdominal tenderness  (mild epigastric). There is no guarding or rebound.     Musculoskeletal:      Right lower leg: No edema.      Left lower leg: No edema.     Neurological:       General: No focal deficit present.      Mental Status: She is alert and oriented to person, place, and time. Mental status is at baseline.  Cranial Nerves: No cranial nerve deficit.      Sensory: No sensory deficit.      Motor:  No weakness.   Psychiatric :         Mood and Affect: Mood normal.         Behavior: Behavior normal.             A/P: 65 y.o. female admitted for dehydration secondary to gastroenteritis.      N/V with Leukocytosis: suspect secondary to viral gastroenteritis. CT a/p at [facility] 5/16 with diverticulosis only. Lipase nml. No other SIRS  criteria met. Previously tolerating po with phenergan at home today.   - Admit to medical for observation   - IV Zofran prn (switch to phenergan if needed, this works well at home)   - MIVF, ADAT       Headache/generalized weakness/episode of AMS (since resolved): suspect 2/2 gastroenteritis and dehydration. No focal deficits.  However given  second hospital visit with recently elevated SBP into 200s, will get imaging to rule out intracranial pathology (low suspiscion). UA not collected yet but sample yesterday significant for few bacteria and ketonuria supporting dehydration.    - CT head non con   - Fioricet and fluids for headache   - If has recurrent AMS consider further work up TSH, UDS (admits to [facility]), EtOH (has not used in 17 yrs), volatiles, NH3       Elevated Cr/Dehydration: BL 1.0. Suspect secondary to IVVD with  decreased po intake.   - s/p 1L, will give additional 500cc now   - MIVF      Elevated trop: POA 52. EKG NSR (read as age indeterminate anteroseptal infarct which was present at [facility] yesterday).   - Repeat trop         I agree with remaining assessment and plan as documented in Dr. Carrie Mew note.      Pt discussed with Dr. Ma Rings (on-call attending physician)      Osie Cheeks, DO   Family Medicine Resident

## 2020-08-08 DIAGNOSIS — I10 Essential (primary) hypertension: Secondary | ICD-10-CM | POA: Insufficient documentation

## 2020-08-08 DIAGNOSIS — G9341 Metabolic encephalopathy: Principal | ICD-10-CM

## 2020-08-08 LAB — URINALYSIS W/MICROSCOPIC
Bilirubin: NEGATIVE
Blood: NEGATIVE
Glucose: NEGATIVE mg/dL
Nitrites: NEGATIVE
Protein: NEGATIVE mg/dL
Specific gravity: 1.013 (ref 1.003–1.030)
Urobilinogen: 0.2 EU/dL (ref 0.2–1.0)
pH (UA): 5.5 (ref 5.0–8.0)

## 2020-08-08 LAB — LIPASE
Lipase: 60 U/L — ABNORMAL LOW (ref 73–393)
Lipase: 60 U/L — ABNORMAL LOW (ref 73–393)

## 2020-08-08 LAB — URINALYSIS W/ RFLX MICROSCOPIC
Bilirubin, Urine: NEGATIVE
Bilirubin: NEGATIVE
Blood, Urine: NEGATIVE
Blood: NEGATIVE
Glucose, Ur: NEGATIVE mg/dL
Glucose: NEGATIVE mg/dL
Ketone: NEGATIVE mg/dL
Ketones, Urine: NEGATIVE mg/dL
Leukocyte Esterase, Urine: NEGATIVE
Leukocyte Esterase: NEGATIVE
Nitrite, Urine: NEGATIVE
Nitrites: NEGATIVE
Protein, UA: NEGATIVE mg/dL
Protein: NEGATIVE mg/dL
Specific Gravity, UA: 1.006 (ref 1.003–1.030)
Specific gravity: 1.006 (ref 1.003–1.030)
Urobilinogen, UA, POCT: 0.2 EU/dL (ref 0.2–1.0)
Urobilinogen: 0.2 EU/dL (ref 0.2–1.0)
pH (UA): 6 (ref 5.0–8.0)
pH, UA: 6 (ref 5.0–8.0)

## 2020-08-08 LAB — METABOLIC PANEL, COMPREHENSIVE
A-G Ratio: 0.8 — ABNORMAL LOW (ref 1.1–2.2)
A-G Ratio: 0.9 — ABNORMAL LOW (ref 1.1–2.2)
ALT (SGPT): 25 U/L (ref 12–78)
ALT (SGPT): 26 U/L (ref 12–78)
AST (SGOT): 15 U/L (ref 15–37)
AST (SGOT): 17 U/L (ref 15–37)
Albumin: 2.7 g/dL — ABNORMAL LOW (ref 3.5–5.0)
Albumin: 3.1 g/dL — ABNORMAL LOW (ref 3.5–5.0)
Alk. phosphatase: 55 U/L (ref 45–117)
Alk. phosphatase: 61 U/L (ref 45–117)
Anion gap: 6 mmol/L (ref 5–15)
Anion gap: 9 mmol/L (ref 5–15)
BUN/Creatinine ratio: 13 (ref 12–20)
BUN/Creatinine ratio: 14 (ref 12–20)
BUN: 12 MG/DL (ref 6–20)
BUN: 17 MG/DL (ref 6–20)
Bilirubin, total: 0.6 MG/DL (ref 0.2–1.0)
Bilirubin, total: 0.8 MG/DL (ref 0.2–1.0)
CO2: 24 mmol/L (ref 21–32)
CO2: 29 mmol/L (ref 21–32)
Calcium: 8 MG/DL — ABNORMAL LOW (ref 8.5–10.1)
Calcium: 9 MG/DL (ref 8.5–10.1)
Chloride: 108 mmol/L (ref 97–108)
Chloride: 99 mmol/L (ref 97–108)
Creatinine: 0.9 MG/DL (ref 0.55–1.02)
Creatinine: 1.2 MG/DL — ABNORMAL HIGH (ref 0.55–1.02)
GFR est AA: 55 mL/min/{1.73_m2} — ABNORMAL LOW (ref >60)
GFR est AA: >60 mL/min/{1.73_m2} (ref >60)
GFR est non-AA: 45 mL/min/{1.73_m2} — ABNORMAL LOW (ref >60)
GFR est non-AA: >60 mL/min/{1.73_m2} (ref >60)
Globulin: 3.4 g/dL (ref 2.0–4.0)
Globulin: 3.5 g/dL (ref 2.0–4.0)
Glucose: 95 mg/dL (ref 65–100)
Glucose: 98 mg/dL (ref 65–100)
Potassium: 3.4 mmol/L — ABNORMAL LOW (ref 3.5–5.1)
Potassium: 3.8 mmol/L (ref 3.5–5.1)
Protein, total: 6.2 g/dL — ABNORMAL LOW (ref 6.4–8.2)
Protein, total: 6.5 g/dL (ref 6.4–8.2)
Sodium: 137 mmol/L (ref 136–145)
Sodium: 138 mmol/L (ref 136–145)

## 2020-08-08 LAB — CBC WITH AUTOMATED DIFF
ABS. BASOPHILS: 0 10*3/uL (ref 0.0–0.1)
ABS. EOSINOPHILS: 0.1 10*3/uL (ref 0.0–0.4)
ABS. IMM. GRANS.: 0 10*3/uL (ref 0.00–0.04)
ABS. LYMPHOCYTES: 2.3 10*3/uL (ref 0.8–3.5)
ABS. MONOCYTES: 1 10*3/uL (ref 0.0–1.0)
ABS. NEUTROPHILS: 6.7 10*3/uL (ref 1.8–8.0)
ABSOLUTE NRBC: 0 10*3/uL (ref 0.00–0.01)
BASOPHILS: 0 % (ref 0–1)
EOSINOPHILS: 1 % (ref 0–7)
HCT: 35.6 % (ref 35.0–47.0)
HGB: 12.1 g/dL (ref 11.5–16.0)
IMMATURE GRANULOCYTES: 0 % (ref 0.0–0.5)
LYMPHOCYTES: 23 % (ref 12–49)
MCH: 30.8 PG (ref 26.0–34.0)
MCHC: 34 g/dL (ref 30.0–36.5)
MCV: 90.6 FL (ref 80.0–99.0)
MONOCYTES: 10 % (ref 5–13)
MPV: 9.8 FL (ref 8.9–12.9)
NEUTROPHILS: 66 % (ref 32–75)
NRBC: 0 PER 100 WBC
PLATELET: 319 10*3/uL (ref 150–400)
RBC: 3.93 M/uL (ref 3.80–5.20)
RDW: 13.2 % (ref 11.5–14.5)
WBC: 10.1 10*3/uL (ref 3.6–11.0)

## 2020-08-08 LAB — EKG, 12 LEAD, INITIAL
Atrial Rate: 72 {beats}/min
Calculated P Axis: 53 degrees
Calculated R Axis: 53 degrees
Calculated T Axis: 35 degrees
Diagnosis: NORMAL
P-R Interval: 150 ms
Q-T Interval: 400 ms
QRS Duration: 64 ms
QTC Calculation (Bezet): 438 ms
Ventricular Rate: 72 {beats}/min

## 2020-08-08 LAB — TROPONIN-HIGH SENSITIVITY
Troponin-High Sensitivity: 46 ng/L (ref 0–51)
Troponin-High Sensitivity: 52 ng/L — ABNORMAL HIGH (ref 0–51)

## 2020-08-08 LAB — MAGNESIUM
Magnesium: 2.1 mg/dL (ref 1.6–2.4)
Magnesium: 2.1 mg/dL (ref 1.6–2.4)
Magnesium: 2.2 mg/dL (ref 1.6–2.4)
Magnesium: 2.2 mg/dL (ref 1.6–2.4)

## 2020-08-08 LAB — URINE CULTURE HOLD SAMPLE

## 2020-08-08 LAB — PHOSPHORUS
Phosphorus: 2.6 MG/DL (ref 2.6–4.7)
Phosphorus: 2.6 MG/DL (ref 2.6–4.7)

## 2020-08-08 LAB — CBC WITH AUTO DIFFERENTIAL
Basophils %: 0 % (ref 0–1)
Basophils Absolute: 0 10*3/uL (ref 0.0–0.1)
Eosinophils %: 1 % (ref 0–7)
Eosinophils Absolute: 0.1 10*3/uL (ref 0.0–0.4)
Granulocyte Absolute Count: 0 10*3/uL (ref 0.00–0.04)
Hematocrit: 35.6 % (ref 35.0–47.0)
Hemoglobin: 12.1 g/dL (ref 11.5–16.0)
Immature Granulocytes: 0 % (ref 0.0–0.5)
Lymphocytes %: 23 % (ref 12–49)
Lymphocytes Absolute: 2.3 10*3/uL (ref 0.8–3.5)
MCH: 30.8 PG (ref 26.0–34.0)
MCHC: 34 g/dL (ref 30.0–36.5)
MCV: 90.6 FL (ref 80.0–99.0)
MPV: 9.8 FL (ref 8.9–12.9)
Monocytes %: 10 % (ref 5–13)
Monocytes Absolute: 1 10*3/uL (ref 0.0–1.0)
NRBC Absolute: 0 10*3/uL (ref 0.00–0.01)
Neutrophils %: 66 % (ref 32–75)
Neutrophils Absolute: 6.7 10*3/uL (ref 1.8–8.0)
Nucleated RBCs: 0 PER 100 WBC
Platelets: 319 10*3/uL (ref 150–400)
RBC: 3.93 M/uL (ref 3.80–5.20)
RDW: 13.2 % (ref 11.5–14.5)
WBC: 10.1 10*3/uL (ref 3.6–11.0)

## 2020-08-08 LAB — URINALYSIS WITH MICROSCOPIC
Bilirubin, Urine: NEGATIVE
Blood, Urine: NEGATIVE
Glucose, Ur: NEGATIVE mg/dL
Nitrite, Urine: NEGATIVE
Protein, UA: NEGATIVE mg/dL
Specific Gravity, UA: 1.013 (ref 1.003–1.030)
Urobilinogen, UA, POCT: 0.2 EU/dL (ref 0.2–1.0)
pH, UA: 5.5 (ref 5.0–8.0)

## 2020-08-08 LAB — COMPREHENSIVE METABOLIC PANEL
ALT: 25 U/L (ref 12–78)
ALT: 26 U/L (ref 12–78)
AST: 15 U/L (ref 15–37)
AST: 17 U/L (ref 15–37)
Albumin/Globulin Ratio: 0.8 — ABNORMAL LOW (ref 1.1–2.2)
Albumin/Globulin Ratio: 0.9 — ABNORMAL LOW (ref 1.1–2.2)
Albumin: 2.7 g/dL — ABNORMAL LOW (ref 3.5–5.0)
Albumin: 3.1 g/dL — ABNORMAL LOW (ref 3.5–5.0)
Alkaline Phosphatase: 55 U/L (ref 45–117)
Alkaline Phosphatase: 61 U/L (ref 45–117)
Anion Gap: 6 mmol/L (ref 5–15)
Anion Gap: 9 mmol/L (ref 5–15)
BUN: 12 MG/DL (ref 6–20)
BUN: 17 MG/DL (ref 6–20)
Bun/Cre Ratio: 13 (ref 12–20)
Bun/Cre Ratio: 14 (ref 12–20)
CO2: 24 mmol/L (ref 21–32)
CO2: 29 mmol/L (ref 21–32)
Calcium: 8 MG/DL — ABNORMAL LOW (ref 8.5–10.1)
Calcium: 9 MG/DL (ref 8.5–10.1)
Chloride: 108 mmol/L (ref 97–108)
Chloride: 99 mmol/L (ref 97–108)
Creatinine: 0.9 MG/DL (ref 0.55–1.02)
Creatinine: 1.2 MG/DL — ABNORMAL HIGH (ref 0.55–1.02)
EGFR IF NonAfrican American: 45 mL/min/{1.73_m2} — ABNORMAL LOW (ref >60)
EGFR IF NonAfrican American: >60 mL/min/{1.73_m2} (ref >60)
GFR African American: 55 mL/min/{1.73_m2} — ABNORMAL LOW (ref >60)
GFR African American: >60 mL/min/{1.73_m2} (ref >60)
Globulin: 3.4 g/dL (ref 2.0–4.0)
Globulin: 3.5 g/dL (ref 2.0–4.0)
Glucose: 95 mg/dL (ref 65–100)
Glucose: 98 mg/dL (ref 65–100)
Potassium: 3.4 mmol/L — ABNORMAL LOW (ref 3.5–5.1)
Potassium: 3.8 mmol/L (ref 3.5–5.1)
Sodium: 137 mmol/L (ref 136–145)
Sodium: 138 mmol/L (ref 136–145)
Total Bilirubin: 0.6 MG/DL (ref 0.2–1.0)
Total Bilirubin: 0.8 MG/DL (ref 0.2–1.0)
Total Protein: 6.2 g/dL — ABNORMAL LOW (ref 6.4–8.2)
Total Protein: 6.5 g/dL (ref 6.4–8.2)

## 2020-08-08 LAB — EKG 12-LEAD
Atrial Rate: 72 {beats}/min
Diagnosis: NORMAL
P Axis: 53 degrees
P-R Interval: 150 ms
Q-T Interval: 400 ms
QRS Duration: 64 ms
QTc Calculation (Bazett): 438 ms
R Axis: 53 degrees
T Axis: 35 degrees
Ventricular Rate: 72 {beats}/min

## 2020-08-08 LAB — TROPONIN, HIGH SENSITIVITY
Troponin, High Sensitivity: 46 ng/L (ref 0–51)
Troponin, High Sensitivity: 52 ng/L — ABNORMAL HIGH (ref 0–51)

## 2020-08-08 MED ORDER — SODIUM CHLORIDE 0.9 % IV
INTRAVENOUS | Status: DC
Start: 2020-08-08 — End: 2020-08-08
  Administered 2020-08-08 (×2): via INTRAVENOUS

## 2020-08-08 MED ORDER — FAMOTIDINE 20 MG TAB
20 mg | Freq: Every day | ORAL | Status: DC
Start: 2020-08-08 — End: 2020-08-08
  Administered 2020-08-08: 15:00:00 via ORAL

## 2020-08-08 MED ORDER — LISINOPRIL 5 MG TAB
5 mg | Freq: Every day | ORAL | Status: DC
Start: 2020-08-08 — End: 2020-08-08
  Administered 2020-08-08: 15:00:00 via ORAL

## 2020-08-08 MED ORDER — SODIUM CHLORIDE 0.9% BOLUS IV
0.9 % | Freq: Once | INTRAVENOUS | Status: AC
Start: 2020-08-08 — End: 2020-08-07
  Administered 2020-08-08: 02:00:00 via INTRAVENOUS

## 2020-08-08 MED ORDER — WHITE PETROLATUM-MINERAL OIL 83 %-15 % EYE OINTMENT
83-15 % | OPHTHALMIC | Status: DC | PRN
Start: 2020-08-08 — End: 2020-08-08

## 2020-08-08 MED ORDER — ACETAMINOPHEN 325 MG TABLET
325 mg | Freq: Four times a day (QID) | ORAL | Status: DC | PRN
Start: 2020-08-08 — End: 2020-08-07

## 2020-08-08 MED ORDER — COLESTIPOL 1 GRAM TAB
1 gram | Freq: Every day | ORAL | Status: DC
Start: 2020-08-08 — End: 2020-08-08
  Administered 2020-08-08: 15:00:00 via ORAL

## 2020-08-08 MED ORDER — BUTALBITAL-ACETAMINOPHEN-CAFFEINE 50 MG-325 MG-40 MG TAB
50-325-40 mg | Freq: Four times a day (QID) | ORAL | Status: DC | PRN
Start: 2020-08-08 — End: 2020-08-08
  Administered 2020-08-08 (×2): via ORAL

## 2020-08-08 MED ORDER — ONDANSETRON (PF) 4 MG/2 ML INJECTION
4 mg/2 mL | Freq: Four times a day (QID) | INTRAMUSCULAR | Status: DC | PRN
Start: 2020-08-08 — End: 2020-08-08

## 2020-08-08 MED ORDER — SODIUM CHLORIDE 0.9 % IJ SYRG
INTRAMUSCULAR | Status: DC | PRN
Start: 2020-08-08 — End: 2020-08-08

## 2020-08-08 MED ORDER — VENLAFAXINE SR 37.5 MG 24 HR CAP
37.5 mg | Freq: Every day | ORAL | Status: DC
Start: 2020-08-08 — End: 2020-08-08
  Administered 2020-08-08: 15:00:00 via ORAL

## 2020-08-08 MED ORDER — DICYCLOMINE 10 MG CAP
10 mg | Freq: Four times a day (QID) | ORAL | Status: DC | PRN
Start: 2020-08-08 — End: 2020-08-08

## 2020-08-08 MED ORDER — METOPROLOL TARTRATE 25 MG TAB
25 mg | Freq: Two times a day (BID) | ORAL | Status: DC
Start: 2020-08-08 — End: 2020-08-08
  Administered 2020-08-08: 15:00:00 via ORAL

## 2020-08-08 MED ORDER — ACETAMINOPHEN 650 MG RECTAL SUPPOSITORY
650 mg | Freq: Four times a day (QID) | RECTAL | Status: DC | PRN
Start: 2020-08-08 — End: 2020-08-07

## 2020-08-08 MED ORDER — SODIUM CHLORIDE 0.9 % IJ SYRG
Freq: Three times a day (TID) | INTRAMUSCULAR | Status: DC
Start: 2020-08-08 — End: 2020-08-08
  Administered 2020-08-08 (×3): via INTRAVENOUS

## 2020-08-08 MED ORDER — ONDANSETRON 4 MG TAB, RAPID DISSOLVE
4 mg | Freq: Three times a day (TID) | ORAL | Status: DC | PRN
Start: 2020-08-08 — End: 2020-08-07

## 2020-08-08 MED ORDER — POLYETHYLENE GLYCOL 3350 17 GRAM (100 %) ORAL POWDER PACKET
17 gram | Freq: Every day | ORAL | Status: DC | PRN
Start: 2020-08-08 — End: 2020-08-08

## 2020-08-08 MED ORDER — PROMETHAZINE 12.5 MG TAB
12.5 mg | ORAL_TABLET | Freq: Four times a day (QID) | ORAL | 0 refills | Status: DC | PRN
Start: 2020-08-08 — End: 2020-10-09

## 2020-08-08 MED FILL — FAMOTIDINE 20 MG TAB: 20 mg | ORAL | Qty: 1

## 2020-08-08 MED FILL — BUTALBITAL-ACETAMINOPHEN-CAFFEINE 50 MG-325 MG-40 MG TAB: 50-325-40 mg | ORAL | Qty: 1

## 2020-08-08 MED FILL — SODIUM CHLORIDE 0.9 % IV: INTRAVENOUS | Qty: 500

## 2020-08-08 MED FILL — METOPROLOL TARTRATE 25 MG TAB: 25 mg | ORAL | Qty: 1

## 2020-08-08 MED FILL — LISINOPRIL 5 MG TAB: 5 mg | ORAL | Qty: 1

## 2020-08-08 MED FILL — BD POSIFLUSH NORMAL SALINE 0.9 % INJECTION SYRINGE: INTRAMUSCULAR | Qty: 40

## 2020-08-08 MED FILL — LUBRIFRESH PM 83 %-15 % EYE OINTMENT: 83-15 % | OPHTHALMIC | Qty: 3.5

## 2020-08-08 MED FILL — SODIUM CHLORIDE 0.9 % IV: INTRAVENOUS | Qty: 250

## 2020-08-08 MED FILL — COLESTIPOL 1 GRAM TAB: 1 gram | ORAL | Qty: 2

## 2020-08-08 MED FILL — VENLAFAXINE SR 37.5 MG 24 HR CAP: 37.5 mg | ORAL | Qty: 1

## 2020-08-08 NOTE — Telephone Encounter (Signed)
-----   Message from [name] sent at 08/08/2020  4:20 PM EDT -----  Subject: Message to Provider    QUESTIONS  Information for Provider? patient would like a call back. She is currently   in [place] [place]. For flu and dehydration. She would like   to speak to her provider.  ---------------------------------------------------------------------------  --------------  Kristine Banks INFO  What is the best way for the office to contact you? Do not leave any   message, patient will call back for answer  Preferred Call Back Phone Number? [phone]  ---------------------------------------------------------------------------  --------------  SCRIPT ANSWERS  undefined

## 2020-08-08 NOTE — Progress Notes (Signed)
Comprehensive Nutrition Assessment    Type and Reason for Visit: Initial,Positive nutrition screen    Nutrition Recommendations/Plan:   1. Continue regular, GI bland diet as tolerated  2. Provide Ensure Enlive once daily (350 kcal, 44 g carbs, 20 g P) to aid in meeting kcal/protein needs.  3. Please obtain standing scale weight      Malnutrition Assessment:  Malnutrition Status:  At risk for malnutrition (specify) (08/08/20 0948)  - unintended weight loss x 3 years, underweight BMI   Context:  Acute illness     Findings of the 6 clinical characteristics of malnutrition:   Energy Intake:  Mild decrease in energy intake (specify) (x 4 days)  Weight Loss:  No significant weight loss     Body Fat Loss:  No significant body fat loss,     Muscle Mass Loss:  No significant muscle mass loss,    Fluid Accumulation:  No significant fluid accumulation,    Grip Strength:  Not performed       Nutrition Assessment:     Pt is a 65 year old female admitted with Vomiting [R11.10]  Dehydration [E86.0]  Nausea & vomiting [R11.2]. She  has a past medical history of Anxiety, Depression, Hypertension, Myocardial infarction [facility]), Pancreatitis (2004), and Recovering alcoholic (HCC).  BPA for unsure weight loss and poor PO intake. Per documentation, patient's weight has been downtrending x several years; appears to have lost 12# (9.9%) within 6 months, not clinically significant for timeframe. Admission weight is stated; RD ordered measured weight. Patient reports UBW of 145#, but states she has not weighed this in about three years. Started losing weight unintentionally at this time, after being diagnosed with colitis. NKFA. No chewing/swallowing problems. Endorses decreased appetite x ~1 week, but improved today - consumed 100% of breakfast. Usually drinks 2 - 3 Ensures/day. Voices desire to gain weight but inability to do so.     Wt Readings from Last 10 Encounters:   08/07/20 49.4 kg (109 lb)   08/07/20 49.4 kg (109 lb)   02/27/20 54.9  kg (121 lb)   12/21/19 56.8 kg (125 lb 3.2 oz)   07/11/19 59.4 kg (131 lb)   05/05/18 65.3 kg (144 lb)   04/01/18 64 kg (141 lb)   01/25/18 65.3 kg (144 lb)         Nutrition Related Findings:      Wound Type: None    Last Bowel Movement Date: 08/06/20  Edema:No data recorded    Nutr. Labs:      Lab Results   Component Value Date/Time    GFR est AA >60 08/08/2020 05:17 AM    GFR est non-AA >60 08/08/2020 05:17 AM    Creatinine 0.90 08/08/2020 05:17 AM    BUN 12 08/08/2020 05:17 AM    Sodium 138 08/08/2020 05:17 AM    Potassium 3.4 (L) 08/08/2020 05:17 AM    Chloride 108 08/08/2020 05:17 AM    CO2 24 08/08/2020 05:17 AM       Lab Results   Component Value Date/Time    Glucose 95 08/08/2020 05:17 AM       No results found for: HBA1C, HBA1CPOC, HBA1CEXT, HBA1CEXT    Nutr. Meds:  Colestid, pepcid, lisinopril, lopressor, zofran PRN, miralax PRN, effexor-XR    Current Nutrition Intake & Therapies:  Average Meal Intake: 76-100%  Average Supplement Intake: None ordered  ADULT DIET Regular; GI Bland (GERD/Peptic Ulcer)  ADULT ORAL NUTRITION SUPPLEMENT Lunch; Standard High Calorie/High Protein  Anthropometric Measures:  Height: 5\' 6"  (167.6 cm)  Ideal Body Weight (IBW): 130 lbs (59 kg)  Admission Body Weight: 109 lb (stated)  Current Body Wt:  49.4 kg (109 lb), 83.8 % IBW. Stated  Current BMI (kg/m2): 17.6  Usual Body Weight: 65.8 kg (145 lb)  % Weight Change (Calculated): -24.8  Weight Adjustment: No adjustment                 BMI Category: Underweight (BMI less than 18.5)    Estimated Daily Nutrient Needs:  Energy Requirements Based On: Kcal/kg  Weight Used for Energy Requirements: Current  Energy (kcal/day): 1482-1729 (30-35 kcal/kg)  Weight Used for Protein Requirements: Ideal  Protein (g/day): 59-71 (1.0-1.2 g/kg IBW)  Method Used for Fluid Requirements: 1 ml/kcal  Fluid (ml/day): 1610-9604    Nutrition Diagnosis:    Unintended weight loss related to inadequate protein-energy intake as evidenced by weight  loss,BMI    Nutrition Interventions:   Food and/or Nutrient Delivery: Continue current diet,Start oral nutrition supplement  Nutrition Education/Counseling: No recommendations at this time  Coordination of Nutrition Care: Continue to monitor while inpatient,Interdisciplinary rounds       Goals:     Goals: PO intake 75% or greater,by next RD assessment       Nutrition Monitoring and Evaluation:   Behavioral-Environmental Outcomes: None identified  Food/Nutrient Intake Outcomes: Food and nutrient intake,Supplement intake  Physical Signs/Symptoms Outcomes: Biochemical data,Skin,Weight,Nausea/vomiting    Discharge Planning:    Too soon to determine    Joesph July, MS, RD  Contact: Ext: [phone], or via [employer]

## 2020-08-08 NOTE — Progress Notes (Signed)
Bedside, Verbal and Written shift change report given to Theron Arista RN (oncoming nurse) by Antionette/ IJ RN(offgoing nurse). Report included the following information SBAR, Kardex, Intake/Output, MAR, Recent Results and Med Rec Status.

## 2020-08-08 NOTE — Progress Notes (Signed)
Rounded on Catholic patients and provided Anointing of the Sick at request of patient.    Fr. Thomas Shreve

## 2020-08-08 NOTE — Progress Notes (Signed)
Patient states she will do what she wants. Advised patient to call before getting up. Bed alarm armed. Three side rails up. Call bell and phone within reach of patient.

## 2020-08-08 NOTE — Progress Notes (Signed)
Progress  Notes by Carmina Miller, MD at 08/08/20 [phone]                Author: Carmina Miller, MD  Service: FAMILY MEDICINE  Author Type: Resident       Filed: 08/08/20 0622  Date of Service: 08/08/20 0553  Status: Attested           Editor: Carmina Miller, MD (Resident)  Cosigner: Elease Hashimoto, MD at 08/08/20 2319          Attestation signed by Elease Hashimoto, MD at 08/08/20 2319          [facility] Attending Addendum:   I saw and evaluated the patient on the day of the encounter, performing the key elements of the service.  I discussed the findings, assessment and plan with the resident and agree with the resident's  findings and plan as documented in the resident's note.                                              [address]   [place], [place] [address]    Office [phone]   Fax [phone]                   Subjective / Objective        Kristine Banks is a 65 y.o. female with a PMHx of CAD, HTN, anxiety/depression, substance use who is admitted for dehydration in the setting of likely gastroenteritis.        24 Hour Events:    No acute events overnight.      Subjective: Pt was seen and examined at bedside. Sleeping comfortably, denies any nausea at this time. No vomiting overnight, tolerated  liquids without issue. Denies chest pain, SOB, , dysuria, abdominal pain, dizziness.       Objective:         Respiratory:   O2 Device: None    Visit Vitals      BP  137/60 (BP 1 Location: Right upper arm, BP Patient Position: At rest)     Pulse  79     Temp  97.8 ??F (36.6 ??C)     Resp  18     Ht  5\' 6"  (1.676 m)     Wt  109 lb (49.4 kg)     SpO2  98%        BMI  17.59 kg/m??        Physical Exam:   General: No acute distress. Alert. Cooperative.   Respiratory: CTAB, no wheezes or crackles   Cardiovascular: Regular rate and rhythm, clear S1 S2. Pulses present bilaterally.   GI: Nondistended. + bowel sounds. Mild epigastric tenderness, no suprapubic tenderness.    Extremities: No LE edema. Pulses present.   Skin: Warm, dry.   Neuro: Alert and oriented, moving extremities spontanteously       I/O:       Date  08/07/20 0700 - 08/08/20 0659  08/08/20 0700 - 08/09/20 0659             Shift  0700-1859  1900-0659  24 Hour Total  0700-1859  1900-0659  24 Hour Total       INTAKE             I.V.(mL/kg/hr)    1500  1500  Volume (sodium chloride 0.9 % bolus infusion 1,000 mL)    1000  1000                     Volume (sodium chloride 0.9 % bolus infusion 500 mL)    500  500                   Shift Total(mL/kg)    1500(30.3)  1500(30.3)             OUTPUT             Urine(mL/kg/hr)                           Urine Occurrence(s)    1 x  1 x                   Emesis/NG output                           Emesis Occurrence(s)    0 x  0 x                   Stool                           Stool Occurrence(s)    0 x  0 x                   Shift Total(mL/kg)                         NET    1500  1500                   Weight (kg)  49.4  49.4  49.4  49.4  49.4  49.4           Inpatient Medications     Current Facility-Administered Medications          Medication  Dose  Route  Frequency           ?  sodium chloride (NS) flush 5-40 mL   5-40 mL  IntraVENous  Q8H     ?  sodium chloride (NS) flush 5-40 mL   5-40 mL  IntraVENous  PRN     ?  polyethylene glycol (MIRALAX) packet 17 g   17 g  Oral  DAILY PRN     ?  ondansetron (ZOFRAN) injection 4 mg   4 mg  IntraVENous  Q6H PRN     ?  0.9% sodium chloride infusion   100 mL/hr  IntraVENous  CONTINUOUS     ?  butalbital-acetaminophen-caffeine (FIORICET, ESGIC) 50-325-40 mg per tablet 1 Tablet   1 Tablet  Oral  Q6H PRN     ?  colestipoL (COLESTID) tablet 2 g   2 g  Oral  DAILY     ?  dicyclomine (BENTYL) capsule 20 mg   20 mg  Oral  Q6H PRN     ?  famotidine (PEPCID) tablet 20 mg   20 mg  Oral  DAILY     ?  lisinopriL (PRINIVIL, ZESTRIL) tablet 2.5 mg   2.5 mg  Oral  DAILY     ?  metoprolol tartrate (LOPRESSOR) tablet 25 mg   25 mg  Oral   BID     ?  venlafaxine-SR (EFFEXOR-XR) capsule 37.5 mg   37.5 mg  Oral  DAILY           ?  white petrolatum-mineral oiL (AKWA TEARS) 83-15 % ophthalmic ointment     Both Eyes  PRN           Allergies     Allergies        Allergen  Reactions         ?  Ragweed Pollen  Sneezing           CBC:     Recent Labs           08/07/20   1903     WBC  12.1*     HGB  13.2     HCT  39.0        PLT  392           Metabolic Panel:     Recent Labs           08/07/20   1903     NA  137     K  3.8     CL  99     CO2  29     BUN  17     CREA  1.20*     GLU  98     CA  9.0     MG  2.2     ALB  3.1*        ALT  26                   Assessment and Plan        Kristine Banks is a 65 y.o. female with a PMHx of CAD, HTN, anxiety/depression, substance use who is admitted for dehydration in the setting of likely gastroenteritis,  now improving.   ??   Nausea/ Vomiting/ Dehydration: Likely due to viral gastroenteritis, negative workup at [facility] on 5/16 including CT abdomen unremarkable. Now  improving and tolerating liquids well.   - GI Bland, ADAT   - Advance to PO phenergan today. Patient states she does not do well with PO Zofran.   - If tolerating diet and phenergan, likely discharge later today.   - MIVF @ 175ml/hr.    - home Bentyl and Pepcid       Altered Mental Status: No focal deficits. Some confusion which has now fully resolved, likely in the setting of acute illness and dehydration.  CT head with no acute process.   - Fall precautions   - Fully resolved, no further work up at this time.       Abnormal UA: POA UA showing moderate epithelial cells and 3+ bacteria. Likely contaminated specimen.      - Will repeat UA today   ??   Elevated Troponin (resovled): POA 52, decrease to 46. No chest pain, EKG with no signs of ischemia.   ??   Elevated Cr (resolved): POA 1.2, bl ~1.0. Likley due to poor PO intake. S/p 1L NS in ED.   - now resolved with Cr 0.9 today   - MIVF, 138ml/hr   ??   Headache: Likely due to dehydration. No history of  migraines. No photophobia.   - Fioricet q6 PRN   ??   CAD/ s/p MI: s/p Cath in December 2021 after she was told she had a heart attack at ED in [place]. Cath in [place] showing mild blockages  but no stent placed.    -  Continue Atorvastatin 80mg  and Metoprolol 25mg  BID   - Restart ASA  after CT head results. States not taking Plavix.   - will need cardiology follow up, currently does not have a cardiologist after the cath   ??   HTN: chronic   - continue home Lisinopril 2.5mg , [facility]   ??   Depression/ Anxiety: chronic   - continue home Effexor 37.5mg  daily   ??   Hx Microscopic Colitis: chronic   - Colestipol 2g daily    ??   ??   FEN/GI - Clear liquid diet.ADAT, NS at 100 mL/hr.   Activity - Ambulate with assistance   DVT prophylaxis - SCDs   GI prophylaxis - Not indicated at this time   Fall prophylaxis - Fall precautions ordered.   Disposition - Admit to Observation. Plan to d/c to Home. Consulting : None   Code Status - Full. Discussed with patient / caregivers.   Next of Kin Name and Contact - York GriceJohn Ruggieri,  Spouse - [phone]      Carmina MillerJoseph Cletis Muma, MD   Family Medicine Resident              For Billing          Chief Complaint      Patient presents with      ?  Vomiting      ?  Headache               Hospital Problems   Date Reviewed:  12/21/2019                 Codes  Class  Noted  POA        Dehydration  ICD-10-CM: E86.0   ICD-9-CM: 276.51    08/07/2020  Unknown                Vomiting  ICD-10-CM: R11.10   ICD-9-CM: 787.03    08/07/2020  Unknown                Nausea & vomiting  ICD-10-CM: R11.2   ICD-9-CM: 787.01    08/07/2020  Unknown

## 2020-08-08 NOTE — Progress Notes (Signed)
Went in to do bedside report patients bed was up high advised that is a fall risk especially since she has had falls prior to visit. Patient stated she is going to do what she wants anyway. 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

## 2020-08-08 NOTE — Progress Notes (Signed)
Problem: Falls - Risk of  Goal: *Absence of Falls  Description: Document Kristine Banks Fall Risk and appropriate interventions in the flowsheet.  Outcome: Progressing Towards Goal  Note: Fall Risk Interventions:  Mobility Interventions: Patient to call before getting OOB         Medication Interventions: Patient to call before getting OOB    Elimination Interventions: Call light in reach    History of Falls Interventions: Bed/chair exit alarm         Problem: Patient Education: Go to Patient Education Activity  Goal: Patient/Family Education  Outcome: Progressing Towards Goal

## 2020-08-08 NOTE — Progress Notes (Signed)
Problem: Falls - Risk of  Goal: *Absence of Falls  Description: Document Schmid Fall Risk and appropriate interventions in the flowsheet.  Outcome: Progressing Towards Goal  Note: Fall Risk Interventions:  Mobility Interventions: Patient to call before getting OOB         Medication Interventions: Patient to call before getting OOB    Elimination Interventions: Call light in reach    History of Falls Interventions: Bed/chair exit alarm         Problem: Patient Education: Go to Patient Education Activity  Goal: Patient/Family Education  Outcome: Progressing Towards Goal     Problem: Nutrition Deficit  Goal: *Optimize nutritional status  Outcome: Progressing Towards Goal

## 2020-08-08 NOTE — ED Notes (Signed)
 TRANSFER - OUT REPORT:    Verbal report given to Antoinette, RN on Kristine Banks  being transferred to 4th floor for routine progression of care       Report consisted of patient's Situation, Background, Assessment and   Recommendations(SBAR).     Information from the following report(s) SBAR was reviewed with the receiving nurse.    Lines:   Peripheral IV 08/07/20 Right Antecubital (Active)   Site Assessment Clean, dry, & intact 08/08/20 0110   Phlebitis Assessment 0 08/08/20 0110   Infiltration Assessment 0 08/08/20 0110   Dressing Status Clean, dry, & intact 08/08/20 0110   Dressing Type 4 X 4;Transparent 08/08/20 0110   Hub Color/Line Status Patent;Pink 08/08/20 0110   Action Taken Open ports on tubing capped 08/08/20 0110   Alcohol Cap Used Yes 08/08/20 0110        Opportunity for questions and clarification was provided.      Patient transported with:   ER tech

## 2020-08-08 NOTE — Progress Notes (Signed)
08/08/2020 12:09 PM   Care Management Assessment      Reason for Admission: Emergency       ICD-10-CM ICD-9-CM    1. Vomiting, unspecified vomiting type, unspecified whether nausea present  R11.10 787.03    2. Dehydration  E86.0 276.51    3. Acute renal insufficiency  N28.9 593.9        Assessment:   [x] In person with pt   Charted address and phone numbers confirmed.     RUR: 7%  Risk Level: [x] Low [] Moderate [] High  Value-based purchasing: []  Yes [x]  No  Bundle patient: []  Yes []  No   Specify:     Advance Directive: Full Code.    [x]  No AD on file.    []  AD on file.    []  Current AD not on file. Copy requested.    []  Requests AD, and referral submitted to [facility].     Healthcare Decision Maker:         Assessment:    Age: 65    Sex: []  Female [x] Female     Residency: [x] Private residence     Lives With: [x] With spouse [] Other family members [] Underage children [] Alone [] Care provider [] Other:    Prior functioning:  [x] Independent with ADLs and iADLS [] Dependent with ADLs and iADLs [] Partial dependence, Specify:     Prior DME required:  [x] None [] RW [] Cane [] Crutches [] Bedside commode [] CPAP [] Home O2 (Liter/Provider: ) [] Nebulizer   [] Shower Chair [] Wheelchair [] Hospital Bed [] Hoyer [] Stair lift [] Rollator [] Other:    DME available: [x] None [] RW [] Cane [] Crutches [] Bedside commode [] CPAP [] Home O2 (Liter/Provider: ) [] Nebulizer   [] Shower Chair [] Wheelchair [] Hospital Bed [] Hoyer [] Stair lift [] Rollator [] Other:    Rehab history: [x] None [] Outpatient PT [] Home Health (Provider/Date: ) [] SNF (Provider/Date: ) [] IPR (Provider/Date: ) [] LTC (Provider/Date: ) [] Hospice (Provider/Date: )  [] Other:     Discharge Concerns: [] Yes [x] No [] Unknown   Describe:    Comments:     Insurer:   Insurance Information                [employer] Phone: [phone]    Subscriber: Sabre, Corman Subscriber#: [id number]    Group#: -- Precert#: --          PCP: Eugene Garnet   Address: [address] / [place] [address]   Phone number: [phone]   Current patient: [x] Yes [] No   Approximate date of last visit: March 2022   Access to virtual PCP visits: [x] Yes [] No    Pharmacy: [facility]    DC Transport: Pt's husband     Transition of care plan:  [x]  Home with outpatient follow-up  Fredric Mare, BSW   Care Management Interventions  PCP Verified by CM: Yes  MyChart Signup: No  Discharge Durable Medical Equipment: No  Physical Therapy Consult: No  Occupational Therapy Consult: No  Speech Therapy Consult: No  Support Systems: Spouse/Significant Other  Discharge Location  Patient Expects to be Discharged to:: Home with family assistance

## 2020-08-08 NOTE — Progress Notes (Signed)
Hospital Follow Up with PCP Leola Brazil, MD for 08/15/2020 at 11:00am. Patients normal provider Dr Sanda Klein did not have any available appointments within the timeframe required.

## 2020-08-08 NOTE — Progress Notes (Signed)
TRANSFER - IN REPORT:    Verbal report received from [facility]) on Kristine Banks  being received from [place] RN(unit) for routine progression of care      Report consisted of patient's Situation, Background, Assessment and   Recommendations(SBAR).     Information from the following report(s) SBAR, Kardex, ED Summary, Procedure Summary, Intake/Output, MAR, Recent Results and Med Rec Status was reviewed with the receiving nurse.    Opportunity for questions and clarification was provided.      Assessment completed upon patient's arrival to unit and care assumed.

## 2020-08-08 NOTE — Progress Notes (Signed)
Patient complained of headache during discharge instructions. Administered Fioricet. Patient complained of headache 30 minutes after administration and requested a doctor to come see her. Family practice notified. Patient was not seen.      Discharge instructions not presented to patient due to patients refusal to hear or sign discharge papers. Patient spouse signs discharge papers. Patient went against my recommendation of getting transported by wheel chair via main entrance. Patient ambulated down starts with spouse.

## 2020-08-08 NOTE — Discharge Summary (Signed)
Discharge Summary by Rory Percy, MD at 08/08/20 1428                Author: Rory Percy, MD  Service: FAMILY MEDICINE  Author Type: Resident       Filed: 08/08/20 1441  Date of Service: 08/08/20 1428  Status: Attested           Editor: Rory Percy, MD (Resident)  Cosigner: Elease Hashimoto, MD at 08/08/20 2321          Attestation signed by Elease Hashimoto, MD at 08/08/20 2321          [facility] Attending Addendum:   I saw and evaluated the patient on the day of the encounter, performing the key elements of the service.  I discussed the findings, assessment and plan with the resident and agree with the resident's  findings and plan as documented in the resident's note.      Acute metabolic encephalopathy: Significant improvement since presentation at PCPs office.  Able to speak clearly, is alert and oriented. \\R^E   an urgent MRI was not indicated, but should her sx return or other clinical signs present, an MRI could be performed.  Outpatient follow-up with neurology may also be beneficial to explore these concerns.    Gastroenteritis with associated dehydration:  Clinically dehydrated on presentation to the ED.  Received IVF and antiemetics with improvement  in sx and was able to tolerate a regular diet before discharge.  Renal function returned to baseline.  Discharged home with phenergan as this is most effective for her.    Non-cardiac troponin elevation in pt with CAD: Minimal elevation and improvement with IVF.  EKG without evidence of ischemia or infarction.                                            [address]   [place], [place] [address]    Office [phone]   Fax [phone]             Discharge / Transfer / Off-Service Note            Name: Kristine Banks  MRN: [id number]   Sex: Female         Date of Birth: [birth year]   Age: 65 y.o.   PCP: Eugene Garnet, MD        Date of admission: 08/07/2020   Date of discharge/transfer:  08/08/2020      Attending physician at admission: Dr. Berle Mull      Attending physician at discharge/transfer: Elease Hashimoto, MD        Resident physician at discharge/transfer: Rory Percy, MD       Consultants during hospitalization   None       Admission diagnoses    Vomiting [R11.10]   Dehydration [E86.0]   Nausea & vomiting [R11.2]      Recommended follow-up after discharge   1. PCP: Eugene Garnet, MD      Things to follow up on with PCP:   - Outpatient MRI if headaches continue or worsen, or if additional episodes of AMS occur   - Referral to neurology if MRI is remarkable      History of Present Illness   Per admitting provider, Kristine Miller, MD, "WYNONIA Banks is a 65 y.o. female with PMHx of  CAD,  HTN, anxiety/depression, substance use who presents to the ED at request  of her PCP for evaluation of dehydration and AMS in the setting of nausea and vomiting.     ??   Patient states that she has been experiencing nausea and vomiting for the last 6 days. She was seen in [facility] ER on 5/16 where she had an unremarkable abdominal CT, was diagnosed with viral gastroenteritis and sent home with Zofran, Phenergan and Bentyl.  Patient also noted to have elevated BP to 200s at that time, started on Lisinopril 2.5mg  daily.    ??   On presentation to PCP earlier today for follow up, was recommended to present to ER due to inability to tolerate PO and hyponatremia (130), and confusion.    ??   Patient states she has continued to feel weak since that time. She states she has been tolerating fluids and soup with the use of Phenergan. However she has noted a worsening headache,  as well some instances of confusion. She states she suffered some garbled speech and inability "to find the right words" earlier today, but no issues since. She has not taken anything for her headache that started today, no history of migraines, not worsened  by light. Denies any syncope, head trauma, seizure activity, or vision  change."      HOSPITAL COURSE      Timmothy Sourslaine E Misiaszek??is a 65 y.o.??female??with a PMHx of CAD, HTN, anxiety/depression, substance use??who was admitted to the hospital for dehydration in the setting  of likely gastroenteritis, now improving.   ??   Nausea/ Vomiting/ Dehydration: Likely due to viral gastroenteritis, [facility] including CT abdomen unremarkable. Improved  with IVF and anti-emetics. Tolerated GI bland diet on discharge.   - Continue Phenergan as needed for nausea and vomiting   - F/u with PCP    ??   Altered Mental Status:??No focal deficits. Some confusion that resolved by discharge; likely in the setting of acute illness and dehydration. CT  head with no acute process.   - Outpt MRI w/ or w/out Neuro consult if further episodes occur   ??   Abnormal UA: POA UA showed 3+ bacteria, likely contaminant. Repeat U/A normal.   ??   Elevated Troponin (resovled):??POA 52, decreased to 46. No chest pain, EKG with no signs of ischemia.   ??   Elevated Cr (resolved):??POA 1.2, bl ~1.0. Down to 0.9 following IVF.   ??   Headache:??Likely due to dehydration. No history of migraines. No photophobia.   - MRI if worsening symptoms   ??   CAD/ s/p MI:??s/p Cath in December 2021 after she was told she had a heart attack at [place] in [place]. [place] in [place] showing mild blockages  but no stent placed.??   -??Continue Atorvastatin 80mg , ASA 81 daily, and Metoprolol 25mg  BID   - F/u with cardiology   ??   HTN:??chronic   -??continue home Lisinopril 2.5mg , [facility]   ??   Depression/ Anxiety:??chronic   -??continue home Effexor 37.5mg  daily   ??   Hx Microscopic Colitis: chronic   - Colestipol 2g daily??   ??      Physical exam at discharge:    Visit Vitals      BP  139/64 (BP 1 Location: Right upper arm, BP Patient Position: At rest)     Pulse  73     Temp  98 ??F (36.7 ??C)     Resp  18     Ht   (1.676 m)     Wt  109 lb (49.4 kg)     SpO2  100%        BMI  17.59 kg/m??        General: No acute distress.  Alert and cooperative.   Head: Normocephalic and atraumatic   Eyes: PERRL, conjuntiva white, and EOM intact.   Ears: Hearing grossly intact   Neck: Supple. Normal ROM and no stiffness.   Respiratory: Clear to auscultation bilaterally. No wheezes, rales, or rhonchi.   Cardiovascular: RRR. Pulses strong and regular. No murmurs, rubs, or gallops.   GI: Bowel sounds normal. No tenderness or guarding.    Extremities: No lower extremity edema. Distal pulses intact   Musculoskeletal: Full ROM in all extremities.   Skin: Warm and dry. No rashes or color changes   Neuro: Oriented x3. CN II-XII grossly intact.            Condition at discharge: Stable      Labs     Recent Labs            08/08/20   0517  08/07/20   1903     WBC  10.1  12.1*     HGB  12.1  13.2     HCT  35.6  39.0         PLT  319  392          Recent Labs            08/08/20   0517  08/07/20   1903     NA  138  137     K  3.4*  3.8     CL  108  99     CO2  24  29     BUN  12  17     CREA  0.90  1.20*     GLU  95  98     CA  8.0*  9.0     MG  2.1  2.2         PHOS  2.6   --           Recent Labs            08/08/20   0517  08/07/20   1903     ALT  25  26     AP  55  61     TBILI  0.8  0.6     TP  6.2*  6.5     ALB  2.7*  3.1*     GLOB  3.5  3.4         LPSE   --   60*        No results for input(s): PH, PCO2, PO2, TNIPOC, TROIQ, INR, PTP, APTT, FE, TIBC, PSAT, FERR, GLUCPOC, INREXT, INREXT in the last 72 hours.      No lab exists for component: [facility]      Microbiology     Results               Procedure  Component  Value  Units  Date/Time           CULTURE, URINE [[id number]]              Order Status: Canceled  Specimen: Urine from Clean catch             URINE CULTURE HOLD SAMPLE [[id number]]  Collected: 08/08/20 0008            Order Status: Completed  Specimen: Urine from Serum  Updated: 08/08/20 0020               Urine culture hold                 Urine on hold in Microbiology dept for 2 days.  If unpreserved urine is submitted, it cannot be used for  addtional testing after 24 hours, recollection  will be required.                              Procedures / Diagnostic Studies     Echo Results   (Last 48 hours)          None                  Imaging   CT HEAD WO CONT      Result Date: 08/07/2020   No acute process.          Chronic diagnoses       Problem List  as of 08/08/2020  Date Reviewed:  2020-01-17                        Codes  Class  Noted - Resolved             Elevated troponin  ICD-10-CM: R77.8   ICD-9-CM: 790.6    08/08/2020 - Present                       Elevated serum creatinine  ICD-10-CM: R79.89   ICD-9-CM: 790.99    08/08/2020 - Present                       Primary hypertension  ICD-10-CM: I10   ICD-9-CM: 401.9    08/08/2020 - Present                       Depression  ICD-10-CM: F32.A   ICD-9-CM: 311    08/08/2020 - Present                       * (Principal) Dehydration  ICD-10-CM: E86.0   ICD-9-CM: 276.51    08/07/2020 - Present                       Vomiting  ICD-10-CM: R11.10   ICD-9-CM: 787.03    08/07/2020 - Present                       Nausea & vomiting  ICD-10-CM: R11.2   ICD-9-CM: 787.01    08/07/2020 - Present                       Microscopic colitis  ICD-10-CM: K52.839   ICD-9-CM: 558.9    07/03/2020 - Present                       Coronary artery disease involving native coronary artery of native heart without angina pectoris  ICD-10-CM: I25.10   ICD-9-CM: 414.01    05/29/2020 - Present          Overview Signed 05/29/2020  9:28 AM by Hermina Staggers, MD  LHC 02/25/20: Mild nonobstructive CAD. 20-30% ostial RCA. 10-20% distal RCA. 30-40% mid LAD. 20% ostial LCx                                      Discharge/Transfer Medications     Current Discharge Medication List              START taking these medications          Details        promethazine (PHENERGAN) 12.5 mg tablet  Take 1 Tablet by mouth every six (6) hours as needed for Nausea.   Qty: 30 Tablet, Refills:  0   Start date: 08/08/2020                     CONTINUE these medications  which have NOT CHANGED          Details        dicyclomine (BENTYL) 20 mg tablet  Take 20 mg by mouth every six (6) hours as needed for Abdominal Cramps.               lisinopriL (PRINIVIL, ZESTRIL) 5 mg tablet  Take 2.5 mg by mouth daily.               promethazine (PHENERGAN) 25 mg suppository  Insert 25 mg into rectum every six (6) hours as needed for Nausea.               ondansetron (ZOFRAN ODT) 4 mg disintegrating tablet  Take 1 Tablet by mouth every eight (8) hours as needed for Nausea or Vomiting.   Qty: 10 Tablet, Refills:  0          Associated Diagnoses: Nausea               colestipoL (COLESTID) 1 gram tablet  Take 2 Tablets by mouth daily.   Qty: 180 Tablet, Refills:  1          Associated Diagnoses: Microscopic colitis, unspecified microscopic colitis type               venlafaxine-SR (EFFEXOR-XR) 37.5 mg capsule  TAKE ONE CAPSULE BY MOUTH EVERY DAY   Qty: 30 Capsule, Refills:  0          Associated Diagnoses: Anxiety               white petrolatum-mineral oiL (Stye Lubricant) 57.7-31.9 % oint ointment  Administer 3.5 g to both eyes as needed (irritation).   Qty: 3.5 g, Refills:  0          Associated Diagnoses: Eye irritation               atorvastatin (LIPITOR) 80 mg tablet  Take 80 mg by mouth.               famotidine (Pepcid) 20 mg tablet  Take 20 mg by mouth.               metoprolol tartrate (LOPRESSOR) 25 mg tablet  Take 25 mg by mouth two (2) times a day.               clopidogreL (Plavix) 75 mg tab  Take 75 mg by mouth.               aspirin 81 mg chewable tablet  Take 81 mg by mouth.  Diet:  Regular diet.      Activity:  As tolerated      Disposition: Discharge home with close PCP follow up and referral for MRI and/or neurology if indicated      Discharge instructions to patient/family   Please seek medical attention for any new or worsening symptoms particularly fever, chest pain, shortness of breath, abdominal pain, nausea, vomiting      Follow up  plans/appointments     Follow-up Information               Follow up With  Specialties  Details  Why  Contact Info              Rory Percy, MD  Family Medicine  Go on 08/14/2020  @2 :30PM for your hospital follow up visit  [address]   [place]   [phone]                 [phone], MD  Family Medicine  Go on 08/15/2020  Hospital Follow Up at 11:00am. Please Arrive 08/17/2020 Early Bring INsuance Cards, Identification and List of Medications  [address]   [place]   [phone]                    [phone], MD   Family Medicine Resident              For Billing          Chief Complaint      Patient presents with      ?  Vomiting      ?  Headache               Hospital Problems   Date Reviewed:  01/20/20                 Codes  Class  Noted  POA        Elevated troponin  ICD-10-CM: R77.8   ICD-9-CM: 790.6    08/08/2020  Unknown                Elevated serum creatinine  ICD-10-CM: R79.89   ICD-9-CM: 790.99    08/08/2020  Unknown                Primary hypertension  ICD-10-CM: I10   ICD-9-CM: 401.9    08/08/2020  Unknown                Depression  ICD-10-CM: F32.A   ICD-9-CM: 311    08/08/2020  Unknown                * (Principal) Dehydration  ICD-10-CM: E86.0   ICD-9-CM: 276.51    08/07/2020  Unknown                Vomiting  ICD-10-CM: R11.10   ICD-9-CM: 787.03    08/07/2020  Unknown                Nausea & vomiting  ICD-10-CM: R11.2   ICD-9-CM: 787.01    08/07/2020  Unknown                Coronary artery disease involving native coronary artery of native heart without angina pectoris  ICD-10-CM: I25.10   ICD-9-CM: 414.01    05/29/2020  Yes        Overview Signed 05/29/2020  9:28 AM by 07/29/2020, MD  LHC 02/25/20: Mild nonobstructive CAD. 20-30% ostial RCA. 10-20% distal RCA. 30-40% mid LAD. 20% ostial LCx

## 2020-08-08 NOTE — Progress Notes (Signed)
08/08/20    State Observation Letter was verbally explained to patient and provided in writing to patient.  The patient Refused to sign document placed copy in chart.

## 2020-08-09 LAB — EKG, 12 LEAD, INITIAL
Atrial Rate: 72 {beats}/min
Calculated P Axis: 77 degrees
Calculated R Axis: 54 degrees
Calculated T Axis: 45 degrees
Diagnosis: NORMAL
P-R Interval: 162 ms
Q-T Interval: 418 ms
QRS Duration: 58 ms
QTC Calculation (Bezet): 457 ms
Ventricular Rate: 72 {beats}/min

## 2020-08-09 LAB — EKG 12-LEAD
Atrial Rate: 72 {beats}/min
Diagnosis: NORMAL
P Axis: 77 degrees
P-R Interval: 162 ms
Q-T Interval: 418 ms
QRS Duration: 58 ms
QTc Calculation (Bazett): 457 ms
R Axis: 54 degrees
T Axis: 45 degrees
Ventricular Rate: 72 {beats}/min

## 2020-08-10 NOTE — Telephone Encounter (Signed)
Had phone call with patient. Answered questions about her recent admission. She expressed gratitude. Has follow ups scheduled.    Renelda Loma, MD

## 2020-08-14 ENCOUNTER — Ambulatory Visit: Attending: Student in an Organized Health Care Education/Training Program | Primary: Family Medicine

## 2020-08-14 ENCOUNTER — Ambulatory Visit
Admit: 2020-08-14 | Discharge: 2020-08-14 | Payer: PRIVATE HEALTH INSURANCE | Attending: Student in an Organized Health Care Education/Training Program | Primary: Family Medicine

## 2020-08-14 DIAGNOSIS — Z09 Encounter for follow-up examination after completed treatment for conditions other than malignant neoplasm: Secondary | ICD-10-CM

## 2020-08-14 MED ORDER — LISINOPRIL 5 MG TAB
5 mg | ORAL_TABLET | Freq: Every day | ORAL | 2 refills | Status: DC
Start: 2020-08-14 — End: 2020-10-09

## 2020-08-14 NOTE — Progress Notes (Signed)
1. Have you been to the ER, urgent care clinic since your last visit?  Hospitalized since your last visit? Yes     2. Have you seen or consulted any other health care providers outside of the [facility] since your last visit?  Include any pap smears or colon screening. No    3. For patients aged 65-75: Has the patient had a colonoscopy / FIT/ Cologuard? [facility]     If the patient is female:    4. For patients aged 73-74: Has the patient had a mammogram within the past 2 years? [facility]       5. For patients aged 21-65: Has the patient had a pap smear? 2019 [place]     Reviewed record in preparation for visit and have necessary documentation  Pt did not bring medication to office visit for review  Patient is accompanied by self I have received verbal consent from [name] to discuss any/all medical information while they are present in the room.    Goals that were addressed and/or need to be completed during or after this appointment include     Health Maintenance Due   Topic Date Due   . Cervical cancer screen  05/07/2020   . Lipid Screen  07/10/2020

## 2020-08-14 NOTE — Progress Notes (Signed)
I saw and evaluated the patient, performing the key elements of the service.  I discussed the findings, assessment and plan with the resident and agree with the resident's findings and plan as documented in the resident's note.

## 2020-08-14 NOTE — Progress Notes (Signed)
Progress  Notes by Rory Percy, MD at 08/14/20 1430                Author: Rory Percy, MD  Service: --  Author Type: Resident       Filed: 08/14/20 1611  Encounter Date: 08/14/2020  Status: Signed          Editor: Rory Percy, MD (Resident)                               Chief Complaint:          Chief Complaint       Patient presents with        ?  Hospital Follow Up             [facility]            Kristine Banks is a 65 y.o.  female that presents for: Hospital follow-up visit.           Assessment/Plan:     I personally reviewed the following Pertinent Labs/Studies:    - Encounter Notes, Labs, and Imaging from hospitalization on 5/17-5/18.          Kristine Banks??is a 66 y.o.??female??with a PMHx of CAD, HTN, anxiety/depression, and substance use??who was admitted to the hospital on 5/17 for dehydration  in the setting of likely viral gastroenteritis, here for follow up.       Patient also with concerns of more frequent headaches and episodes of expressive aphasia. Will refer her to neurology for further work up, if indicated, including MRI and/or EEG.    ??   Hospital Problems:      Nausea/Vomiting/Dehydration: Likely due to viral gastroenteritis, negative workup at [facility] on 5/16 including CT abdomen unremarkable.??U/A  was negative/unremarkable. Symptoms improved with IVF and anti-emetics. Tolerated GI bland diet in hospital prior to discharge.   -??Continue Phenergan as needed for nausea and vomiting    ??   Altered Mental Status:??No focal deficits on physical exam in the hospital or on head CT scan. Some confusion that resolved had by discharge; likely  in the setting of acute illness and dehydration.??CT head with no acute process.    - Referral to neurology for further work up   ??   Headache:??Likely due to dehydration. No history of migraines. No photophobia. Head CT scan was without any acute intracranial abnormality.    - Would recommend MRI if symptoms worsen   ??      Chronic Problems:       CAD/ s/p MI:??s/p Cath in December 2021 after she was told she had a heart attack at ED in [place]. Cath in [place] showing mild blockages  but no stent placed.??   -??Continue Atorvastatin 80mg , ASA 81 daily, and Metoprolol 25mg  BID   - Routine follow-up with cardiology   ??   HTN:??Today's BP was 127/78. Well-controlled on current regimen.    -??Continue Lisinopril 2.5mg    ??   Depression/ Anxiety:??Well controlled, no concerns.    -??Continue Effexor 37.5mg  daily   ??   Hx Microscopic Colitis: Chronic, stable.   - Continue Colestipol 2g daily??      Diagnoses and all orders for this visit:      1. Hospital discharge follow-up      2. Altered mental status, unspecified altered mental status type   -     REFERRAL TO NEUROLOGY      3.  Essential hypertension   -     lisinopriL (PRINIVIL, ZESTRIL) 5 mg tablet; Take 0.5 Tablets by mouth daily.               Follow up:          Follow-up and Dispositions      ??  Return in 2 days (on 08/16/2020) for Pap smear and well-woman visit..                   Subjective:     HPI:   Kristine Banks is a 64 y.o.  female that presents for: Hospital follow up      Routine follow-up after having been discharged from the hospital where she was admitted for several days of nausea and vomiting thought most likely to be 2/2 viral gastroenteritis and resulting in altered mental status. Since discharge, she reports that  her symptoms have resolved. She is upset, however, because after she was discharged she had a headache and claims no one treated her. However, nursing documented having given the patient a Fioricet prior to discharge and she claimed it resolved the headache  after she left.       Her appetite has improved and she hasn't required any anti-emetics since discharge. She feels her mental status is now closer to her baseline. Despite resolution of her AMS and headaches, however, she expresses that she is still very concerned that something  might be going on. She mentions again that her  mother died of glioblastoma and wants to be sure she doesn't have an issue that we couldn't pick up on CT scan.       Health Maintenance:     Health Maintenance Due        Topic  Date Due         ?  Cervical cancer screen   05/07/2020         ?  Lipid Screen   07/10/2020            Review of Systems    Constitutional: Negative for fever, malaise/fatigue and weight loss.    HENT: Negative for congestion and sinus pain.     Eyes: Negative for blurred vision and double vision.    Respiratory: Negative for cough and shortness of breath.     Cardiovascular: Negative for chest pain and palpitations.    Gastrointestinal: Negative for abdominal pain, diarrhea, nausea and vomiting.    Genitourinary: Negative for dysuria.    Musculoskeletal: Negative for myalgias.    Neurological: Negative for dizziness, speech change, focal weakness and headaches.    Psychiatric/Behavioral: Negative for depression and substance abuse. The patient is not nervous/anxious.              Past medical history, social history, and medications personally reviewed.     Past Medical History:        Diagnosis  Date         ?  Anxiety       ?  Depression       ?  Hypertension       ?  Microscopic colitis  07/03/2020     ?  Myocardial infarction [facility])       ?  Pancreatitis  2004     ?  Recovering alcoholic (HCC)       ?  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  Ventricular premature beats  08/08/2020  Allergies personally reviewed.     Allergies        Allergen  Reactions         ?  Ragweed Pollen  Sneezing     ?  Zofran [Ondansetron Hcl]  Other (comments)             Does not respond well                 Objective:     Vitals reviewed.   Visit Vitals      BP  127/78 (BP 1 Location: Left upper arm, BP Patient Position: Sitting, BP Cuff Size: Adult)     Pulse  69     Temp  97.5 ??F (36.4 ??C) (Oral)     Resp  18     Ht  5\' 6"  (1.676 m)     Wt  117 lb (53.1 kg)     SpO2  100%        BMI  18.88 kg/m??               Physical Exam    Constitutional :        General: She is not in acute distress.     Appearance: Normal appearance.   Eyes:       Extraocular Movements: Extraocular movements intact.      Conjunctiva/sclera: Conjunctivae normal.   Cardiovascular:       Rate and Rhythm: Normal rate and regular rhythm.      Pulses: Normal pulses.      Heart sounds: Normal heart sounds. No murmur heard.   No friction rub.    Pulmonary:       Effort: Pulmonary effort is normal.      Breath sounds: Normal breath sounds. No wheezing or rales.    Abdominal:      General: Abdomen is flat. Bowel sounds are normal.      Palpations: Abdomen is soft.      Tenderness: There is no abdominal tenderness.     Musculoskeletal:      Cervical back: Neck supple. No rigidity or tenderness.      Right lower leg: No edema.      Left lower leg: No edema.     Lymphadenopathy:       Cervical: No cervical adenopathy.   Skin :      General: Skin is warm and dry.      Capillary Refill: Capillary refill takes less than 2 seconds.    Neurological:       General: No focal deficit present.      Mental Status: She is alert and oriented to person, place, and time.      Cranial Nerves: No cranial nerve deficit.                   Pt was discussed with Dr. (attending physician).      I have reviewed pertinent labs results and other data. I have discussed the diagnosis with the patient and the intended plan as seen in the above orders. The patient has received an after-visit  summary and questions were answered concerning future plans. I have discussed medication side effects and warnings with the patient as well.      Eugene Garnet, MD   Resident [facility]   08/14/20

## 2020-08-15 ENCOUNTER — Encounter: Payer: PRIVATE HEALTH INSURANCE | Attending: Family Medicine | Primary: Family Medicine

## 2020-08-16 ENCOUNTER — Ambulatory Visit: Attending: Family Medicine | Primary: Family Medicine

## 2020-08-16 ENCOUNTER — Ambulatory Visit
Admit: 2020-08-16 | Discharge: 2020-08-16 | Payer: PRIVATE HEALTH INSURANCE | Attending: Family Medicine | Primary: Family Medicine

## 2020-08-16 ENCOUNTER — Inpatient Hospital Stay: Admit: 2020-08-17 | Payer: PRIVATE HEALTH INSURANCE | Primary: Family Medicine

## 2020-08-16 DIAGNOSIS — Z01419 Encounter for gynecological examination (general) (routine) without abnormal findings: Secondary | ICD-10-CM

## 2020-08-16 MED ORDER — VENLAFAXINE SR 75 MG 24 HR CAP
75 mg | ORAL_CAPSULE | Freq: Every day | ORAL | 0 refills | Status: DC
Start: 2020-08-16 — End: 2020-12-02

## 2020-08-16 NOTE — Progress Notes (Signed)
CC: Numbness/tingling in legs, color change/coldness in toes and fingers and leg weakness/pre-syncope.     HPI: Pt is a 65 y.o. female who presents for numbness/tingling in legs, color change/coldness in toes and fingers,and weakness/pre-syncope. 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 Lately she has been feeling them in runs. She was placed on metoprolol for this but it doesn't seem to be helping anymore.    She has an appt with Neurology coming up in August related to her recent hospitalization. She reports a sensation that starts in the calves, feels like a warmth and then numbness/tingling and it moves up to the thighs. Then her legs feel weak and like she might fall. Several times she has fallen. Only happens when she is up and walking around.     She notes that hot flashes have been worsening. She has been on Effexor for this and initially felt like it helped but not as much lately.    Past Medical History:   Diagnosis Date   ??? Anxiety    ??? Depression    ??? Hypertension    ??? Microscopic colitis 07/03/2020   ??? Myocardial infarction [facility])    ??? Pancreatitis 2004   ??? Recovering alcoholic (HCC)    ??? 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 Ventricular premature beats 08/08/2020       Family History   Problem Relation Age of Onset   ??? Hypertension Mother    ??? Cancer Mother         glioblastoma   ??? Thyroid Disease Mother         Unsure if overactive or underactive   ??? Thyroid Disease Brother    ??? Hypertension Brother    ??? Other Brother         Factor V Leiden       Social History     Tobacco Use   ??? Smoking status: Former Smoker     Packs/day: 1.00     Years: 30.00     Pack years: 30.00     Quit date: 03/24/2012     Years since quitting: 8.4   ??? Smokeless tobacco: Never Used   ??? Tobacco comment: vape   Substance Use Topics   ??? Alcohol use: Not on file   ??? Drug use: Not on file       ROS:  Per HPI    PE:  Visit  Vitals  BP (!) 105/48   Pulse (!) 57   Temp 97.6 ??F (36.4 ??C)   Resp 16   Ht 5\' 6"  (1.676 m)   Wt 114 lb (51.7 kg)   SpO2 95%   BMI 18.40 kg/m??     Gen: Pt sitting in chair, in NAD  Head: Normocephalic, atraumatic  Eyes: Sclera anicteric, EOM grossly intact, PERRL  Nose: Normal nasal mucosa   Throat: MMM, normal lips, tongue, teeth and gums  Neck: Supple  CVS: Normal S1, S2, no m/r/g  Resp: CTAB, no wheezes or rales  Extrem: Atraumatic, no cyanosis or edema  Pulses: 2+   Skin: Warm, dry  Neuro: Alert, oriented, appropriate      A/P:   Encounter Diagnoses     ICD-10-CM ICD-9-CM   1. Well woman exam  Z01.419 V72.31   2. Pre-syncope  R55 780.2   3. Personal history of tobacco use, presenting hazards to health  [phone] V15.82  4. Anxiety  F41.9 300.00   5. Hot flashes  R23.2 782.62     1. Well woman exam: See separate note from today.   - PAP IG, APTIMA HPV AND RFX 16/18,45 ([id number]); Future    2. Pre-syncope: Possible POTS vs related to runs of PVC's/Vtach? Pt with multiple symptoms, hard to sort out what could be causing this. She already has an appt with Neurology coming up and is established with [facility] in [place]. Will have her go back to [facility] for additional work-up.   - REFERRAL TO CARDIOLOGY    3. Personal history of tobacco use, presenting hazards to health  - CT LOW DOSE LUNG CANCER SCREENING; Future    4. Hot flashes: Will try increased dose of Effexor. Pt has the 75mg  dose at home.   - venlafaxine-SR (EFFEXOR-XR) 75 mg capsule; Take 1 Capsule by mouth daily.  Dispense: 1 Capsule; Refill: 0       RTC in 1 year for well woman check, or sooner prn    Discussed diagnoses in detail with patient.   Medication risks/benefits/side effects discussed with patient.   All of the patient's questions were addressed. The patient understands and agrees with our plan of care.  The patient knows to call back if they are unsure of or forget any changes we discussed today or if the symptoms change.  The patient received an  After-Visit Summary which contains VS, orders, medication list and allergy list. This can be used as a "mini-medical record" should they have to seek medical care while out of town.    Current Outpatient Medications on File Prior to Visit   Medication Sig Dispense Refill   ??? metoprolol succinate (TOPROL-XL) 25 mg XL tablet      ??? multivitamin (ONE A DAY) tablet Take 1 Tablet by mouth daily.     ??? lisinopriL (PRINIVIL, ZESTRIL) 5 mg tablet Take 0.5 Tablets by mouth daily. 30 Tablet 2   ??? promethazine (PHENERGAN) 12.5 mg tablet Take 1 Tablet by mouth every six (6) hours as needed for Nausea. 30 Tablet 0   ??? dicyclomine (BENTYL) 20 mg tablet Take 20 mg by mouth every six (6) hours as needed for Abdominal Cramps.     ??? promethazine (PHENERGAN) 25 mg suppository Insert 25 mg into rectum every six (6) hours as needed for Nausea.     ??? colestipoL (COLESTID) 1 gram tablet Take 2 Tablets by mouth daily. 180 Tablet 1   ??? white petrolatum-mineral oiL (Stye Lubricant) 57.7-31.9 % oint ointment Administer 3.5 g to both eyes as needed (irritation). 3.5 g 0   ??? famotidine (Pepcid) 20 mg tablet Take 20 mg by mouth.     ??? aspirin 81 mg chewable tablet Take 81 mg by mouth.     ??? metoprolol tartrate (LOPRESSOR) 25 mg tablet Take 25 mg by mouth two (2) times a day. (Patient not taking: Reported on 08/16/2020)       No current facility-administered medications on file prior to visit.       Discussed with the patient the current USPSTF guidelines released May 31, 2019 for screening for lung cancer.    For adults aged 86 to 60 years who have a 20 pack-year smoking history and currently smoke or have quit within the past 15 years the grade B recommendation is to:  Screen for lung cancer with low-dose computed tomography (LDCT) every year.  Stop screening once a person has not smoked for 15 years or has a  health problem that limits life expectancy or the ability to have lung surgery.    The patient has a 30 pack-year history of cigarette  smoking and currently is not smoking, vaping.  Discussed with patient the risks and benefits of screening, including over-diagnosis, false positive rate, and total radiation exposure.  The patient currently exhibits no signs or symptoms suggestive of lung cancer.  Discussed with patient the importance of compliance with yearly annual lung cancer screenings and willingness to undergo diagnosis and treatment if screening scan is positive.  In addition, the patient was counseled regarding the importance of remaining smoke free and/or total smoking cessation.    Also reviewed the following if the patient has Medicare that as of May 03, 2020, Medicare only covers LDCT screening in patients aged 50-77 with at least a 20 pack-year smoking history who currently smoke or have quit in the last 15 years

## 2020-08-16 NOTE — Progress Notes (Signed)
CC: [facility]    HPI: Pt is a 66 y.o. female who presents for [facility].    Last pap: 04/2017, NILM  History of abnormal paps?: No  Abnormal vaginal bleeding or discharge?: No  Desire to be tested for STDs today?: No  LMP: Postmenopausal  Last mammogram: 05/2020  New lumps or bumps?: No  Pt denies any physical, emotional or verbal abuse.       Past Medical History:   Diagnosis Date   ??? Anxiety    ??? Depression    ??? Hypertension    ??? Microscopic colitis 07/03/2020   ??? Myocardial infarction [facility])    ??? Pancreatitis 2004   ??? Recovering alcoholic (HCC)    ??? 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 Ventricular premature beats 08/08/2020       Family History   Problem Relation Age of Onset   ??? Hypertension Mother    ??? Cancer Mother         glioblastoma   ??? Thyroid Disease Mother         Unsure if overactive or underactive   ??? Thyroid Disease Brother    ??? Hypertension Brother    ??? Other Brother         Factor V Leiden       Social History     Tobacco Use   ??? Smoking status: Former Smoker     Packs/day: 1.00     Years: 30.00     Pack years: 30.00     Quit date: 03/24/2012     Years since quitting: 8.4   ??? Smokeless tobacco: Never Used   ??? Tobacco comment: vape   Substance Use Topics   ??? Alcohol use: Not on file   ??? Drug use: Not on file         PE:  Visit Vitals  BP (!) 105/48   Pulse (!) 57   Temp 97.6 ??F (36.4 ??C)   Resp 16   Ht 5\' 6"  (1.676 m)   Wt 114 lb (51.7 kg)   SpO2 95%   BMI 18.40 kg/m??     Gen: Pt sitting in chair, in NAD  Head: Normocephalic, atraumatic  Eyes: Sclera anicteric, EOM grossly intact, PERRL  Throat: MMM, normal lips, tongue, teeth and gums  Neck: Supple, no LAD, no thyromegaly or carotid bruits  CVS: Normal S1, S2, no m/r/g  Resp: CTAB, no wheezes or rales  Breasts: Symmetric, no lesions or nipple discharge. No masses or tenderness on palpation.  Abd: Soft, non-tender, non-distended  GU: Normal external female genitalia. Vaginal mucosa pink, moist. No discharge. Cervix without visible lesions.   Extrem: Atraumatic,  no cyanosis or edema  Pulses: 2+   Skin: Warm, dry  Neuro: Alert, oriented, appropriate    A/P: Pt is a 65 y.o. female who presents for [facility]  Encounter Diagnoses     ICD-10-CM ICD-9-CM   1. Well woman exam  Z01.419 V72.31   2. Pre-syncope  R55 780.2   3. Personal history of tobacco use, presenting hazards to health  Z87.891 V15.82   4. Anxiety  F41.9 300.00   5. Hot flashes  R23.2 782.62     1. Well woman exam:   - PAP IG, APTIMA HPV AND RFX 16/18,45 [facility]); Future    2. Personal history of tobacco use, presenting hazards to health  - CT LOW DOSE LUNG CANCER SCREENING; Future    5. Hot flashes: Will attempt increased dose of Effexor  - venlafaxine-SR (EFFEXOR-XR)  75 mg capsule; Take 1 Capsule by mouth daily.  Dispense: 1 Capsule; Refill: 0       RTC in 1 year for [facility] or sooner prn    Discussed diagnoses in detail with patient.   Medication risks/benefits/side effects discussed with patient.   All of the patient's questions were addressed. The patient understands and agrees with our plan of care.  The patient knows to call back if they are unsure of or forget any changes we discussed today or if the symptoms change.  The patient received an After-Visit Summary which contains VS, orders, medication list and allergy list. This can be used as a "mini-medical record" should they have to seek medical care while out of town.    Current Outpatient Medications on File Prior to Visit   Medication Sig Dispense Refill   ??? metoprolol succinate (TOPROL-XL) 25 mg XL tablet      ??? multivitamin (ONE A DAY) tablet Take 1 Tablet by mouth daily.     ??? lisinopriL (PRINIVIL, ZESTRIL) 5 mg tablet Take 0.5 Tablets by mouth daily. 30 Tablet 2   ??? promethazine (PHENERGAN) 12.5 mg tablet Take 1 Tablet by mouth every six (6) hours as needed for Nausea. 30 Tablet 0   ??? dicyclomine (BENTYL) 20 mg tablet Take 20 mg by mouth every six (6) hours as needed for Abdominal Cramps.     ??? promethazine (PHENERGAN) 25 mg suppository Insert 25 mg into  rectum every six (6) hours as needed for Nausea.     ??? colestipoL (COLESTID) 1 gram tablet Take 2 Tablets by mouth daily. 180 Tablet 1   ??? white petrolatum-mineral oiL (Stye Lubricant) 57.7-31.9 % oint ointment Administer 3.5 g to both eyes as needed (irritation). 3.5 g 0   ??? famotidine (Pepcid) 20 mg tablet Take 20 mg by mouth.     ??? aspirin 81 mg chewable tablet Take 81 mg by mouth.     ??? metoprolol tartrate (LOPRESSOR) 25 mg tablet Take 25 mg by mouth two (2) times a day. (Patient not taking: Reported on 08/16/2020)       No current facility-administered medications on file prior to visit.

## 2020-08-16 NOTE — Progress Notes (Signed)
1. Have you been to the ER, urgent care clinic since your last visit?  Hospitalized since your last visit?No    2. Have you seen or consulted any other health care providers outside of the [facility] since your last visit?  Include any pap smears or colon screening. No  Reviewed record in preparation for visit and have necessary documentation  Pt did not bring medication to office visit for review  opportunity was given for questions      3. For patients aged 18-75: Has the patient had a colonoscopy / FIT/ Cologuard? Yes - no Care Gap present      If the patient is female:    4. For patients aged 65-74: Has the patient had a mammogram within the past 2 years? Yes - no Care Gap present      5. For patients aged 21-65: Has the patient had a pap smear? No      Goals that were addressed and/or need to be completed during or after this appointment include   Health Maintenance Due   Topic Date Due   . Cervical cancer screen  05/07/2020

## 2020-08-16 NOTE — Addendum Note (Signed)
Addendum Note by Bertell Maria at 08/16/20 1020                Author: Bertell Maria  Service: --  Author Type: --       Filed: 08/16/20 1643  Encounter Date: 08/16/2020  Status: Signed          Editor: Bertell Maria          Addended by: Bertell Maria on: 08/16/2020 04:43 PM    Modules accepted: Orders

## 2020-08-31 ENCOUNTER — Inpatient Hospital Stay: Admit: 2020-08-31 | Payer: MEDICARE | Attending: Family Medicine | Primary: Family Medicine

## 2020-08-31 DIAGNOSIS — Z87891 Personal history of nicotine dependence: Secondary | ICD-10-CM

## 2020-08-31 DIAGNOSIS — F17210 Nicotine dependence, cigarettes, uncomplicated: Secondary | ICD-10-CM

## 2020-08-31 NOTE — Progress Notes (Signed)
MyChart message sent with normal results.

## 2020-10-08 NOTE — Telephone Encounter (Signed)
PT is coughing up blood, and had a hard time breathing last night. Pt states she feels like she has the death rattle. Pt believes she had covid from her husband week of July 4th. Pt thinks now she may have pneumonia.  I advised her to seek care at an ER, pt stated she did not want to go to the ER and would like her PCP to contact her. Please advise.

## 2020-10-08 NOTE — Telephone Encounter (Signed)
Appointment scheduled 10/09/20 with Dr. Patriciaann Clan.

## 2020-10-08 NOTE — Telephone Encounter (Signed)
Patient called and notified of no available appointments and due to symptoms, patient should go to urgent care or the er. Declines all of the above. Stated she will feel worse from driving or waiting to be seen.

## 2020-10-09 ENCOUNTER — Ambulatory Visit: Attending: Student in an Organized Health Care Education/Training Program | Primary: Family Medicine

## 2020-10-09 ENCOUNTER — Telehealth

## 2020-10-09 ENCOUNTER — Ambulatory Visit
Admit: 2020-10-09 | Discharge: 2020-10-09 | Payer: PRIVATE HEALTH INSURANCE | Attending: Student in an Organized Health Care Education/Training Program | Primary: Family Medicine

## 2020-10-09 ENCOUNTER — Inpatient Hospital Stay
Admit: 2020-10-09 | Payer: PRIVATE HEALTH INSURANCE | Attending: Student in an Organized Health Care Education/Training Program | Primary: Family Medicine

## 2020-10-09 DIAGNOSIS — R0602 Shortness of breath: Secondary | ICD-10-CM

## 2020-10-09 LAB — CREATININE AND GFR
Creatinine: 0.95 mg/dL (ref 0.55–1.02)
Creatinine: 0.95 mg/dL (ref 0.55–1.02)
GFR African American: >60 mL/min/{1.73_m2} (ref >60)
GFR est AA: >60 mL/min/{1.73_m2} (ref >60)
GFR est non-AA: 59 mL/min/{1.73_m2} — ABNORMAL LOW (ref >60)
eGFR NON-AA: 59 mL/min/{1.73_m2} — ABNORMAL LOW (ref >60)

## 2020-10-09 LAB — BUN
BUN: 14 mg/dL (ref 6–20)
BUN: 14 mg/dL (ref 6–20)

## 2020-10-09 MED ORDER — IOPAMIDOL 76 % IV SOLN
370 mg iodine /mL (76 %) | Freq: Once | INTRAVENOUS | Status: AC
Start: 2020-10-09 — End: 2020-10-09
  Administered 2020-10-09: 19:00:00 via INTRAVENOUS

## 2020-10-09 MED FILL — ISOVUE-370  76 % INTRAVENOUS SOLUTION: 370 mg iodine /mL (76 %) | INTRAVENOUS | Qty: 100

## 2020-10-09 NOTE — Progress Notes (Signed)
BMP wnl.

## 2020-10-09 NOTE — Progress Notes (Signed)
MyChart message sent with recommendation for referral to Pulmonology.

## 2020-10-09 NOTE — Progress Notes (Signed)
I reviewed with the resident the medical history and the resident's findings on the physical examination.  I discussed with the resident the patient's diagnosis and concur with the plan.

## 2020-10-09 NOTE — Telephone Encounter (Signed)
Called pt and discussed results of CTA chest:  - No PE.  - LUL bronchopneumonia.  - Multiple small nodules measuring up to 5 mm in the LLL.    A/P: CT findings c/w PNA. Also concern for neoplasia given hx of smoking.  - Recommended abx to treat PNA: Omnicef 300 mg BID and doxycycline 100 mg BID for 7 days. Prescriptions sent to pharmacy.  - Referral to Pulmonology placed.  - Recommended repeat CT scan in 6 months to ensure resolution of pulmonary nodules. Study ordered w/ expected date of January 2023.    Clabe Seal, MD

## 2020-10-09 NOTE — Progress Notes (Signed)
Called pt and discussed results of CTA chest:  - No PE.  - LUL bronchopneumonia.  - Multiple small nodules measuring up to 5 mm in the LLL.    A/P: CT findings c/w PNA. Also concern for neoplasia given hx of smoking.  - Recommended abx to treat PNA: Omnicef 300 mg BID and doxycycline 100 mg BID for 7 days. Prescriptions sent to pharmacy.  - Referral to Pulmonology placed.  - Recommended repeat CT scan in 6 months to ensure resolution of pulmonary nodules. Study ordered w/ expected date of January 2023.

## 2020-10-09 NOTE — Progress Notes (Signed)
Chief Complaint   Patient presents with   ??? Cough     blood in sputum    ??? Chest Pain     night of 10/07/20       History of Present Illness:  Kristine Banks is a 65 y.o. female w/ PMHx of stress-induced cardiomyopathy, CAD (NSTEMI in December 2021), HTN, depression/anxiety, tobacco abuse, alcohol use disorder who presents to clinic for evaluation of SOB and chest tightness.    Pt called the office yesterday (7/18) w/ complaint of cough and hemoptysis. She reports that her husband tested positive for COVID on 6/27 and then she experienced onset of respiratory symptoms 6/29. She felt better after three days (she reports she was never tested for COVID), but then 2 days ago on Sunday 7/17, she experienced acute onset tightness in her chest below both breasts associated with SOB, cough, hemoptysis, fatigue, weakness, and dizziness. She reports the associated hemoptysis was pink, not bright red. The episode lasted for fifteen minutes. Hemoptysis and chest tightness resolved, but she reports continued SOB, fatigue, weakness, and dizziness. No vomiting or nausea. No hx of blood clot. Pt does not take estrogen or hormonal products. No hx of malignancy. No asymmetrical leg edema per hx.    Past Medical History:   Diagnosis Date   ??? Anxiety    ??? Depression    ??? Hypertension    ??? Microscopic colitis 07/03/2020   ??? Myocardial infarction [facility])    ??? Pancreatitis 2004   ??? Recovering alcoholic (HCC)    ??? 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 Ventricular premature beats 08/08/2020       Current Outpatient Medications   Medication Sig Dispense Refill   ??? levocetirizine (Xyzal) 5 mg tablet Take 5 mg by mouth daily.     ??? metoprolol succinate (TOPROL-XL) 25 mg XL tablet      ??? venlafaxine-SR (EFFEXOR-XR) 75 mg capsule Take 1 Capsule by mouth daily. 1 Capsule 0   ??? multivitamin (ONE A DAY) tablet Take 1 Tablet by mouth daily.     ??? colestipoL (COLESTID) 1 gram tablet Take 2 Tablets by mouth daily. 180 Tablet 1   ??? famotidine  (Pepcid) 20 mg tablet Take 20 mg by mouth daily.     ??? aspirin 81 mg chewable tablet Take 81 mg by mouth.         Allergies   Allergen Reactions   ??? Ragweed Pollen Sneezing   ??? Zofran [Ondansetron Hcl] Other (comments)     Does not respond well       Social History     Tobacco Use   ??? Smoking status: Former Smoker     Packs/day: 1.00     Years: 30.00     Pack years: 30.00     Quit date: 03/24/2012     Years since quitting: 8.5   ??? Smokeless tobacco: Never Used   ??? Tobacco comment: vape   Substance Use Topics   ??? Alcohol use: Not on file   ??? Drug use: Not on file       Family History   Problem Relation Age of Onset   ??? Hypertension Mother    ??? Cancer Mother         glioblastoma   ??? Thyroid Disease Mother         Unsure if overactive or underactive   ??? Thyroid Disease Brother    ??? Hypertension Brother    ??? Other Brother  Factor V Leiden       Physical Exam:     Visit Vitals  Ht 5\' 6"  (1.676 m)   Wt 124 lb (56.2 kg)   BMI 20.01 kg/m??   T: 97.6  HR 57  BP 105/48  RR 16  O2 sat 95%    Physical Examination:  General: NAD  CV: Heart: Regular rate and rhythm.  Resp: Breathing comfortably on room air. Lungs CTAB, no wheezing, rhonchi, or rales.  Neuro: Awake, alert, and appropriately conversant. CN II-XII grossly intact. Moving extremities spontaneously.      Assessment/Plan:    Diagnoses and all orders for this visit:    SOB / hemoptysis / chest tightness: EKG demonstrated normal sinus rhythm with possibly peaked T-waves. No S1Q3T3. No ST-elevations. Wells score for PE 4. Recommended CTA chest to evaluate for PE. Pt amenable to testing today. STAT CTA chest ordered and scheduled in [facility] for this afternoon. Time for scan given to pt before leaving appointment today.  -     CTA CHEST W OR W WO CONT; Future    Peaked T-waves on EKG: Will measure serum K.  -     METABOLIC PANEL, BASIC; Future    CAD:   - Continue ASA 81 mg daily  - Continue metoprolol 25 mg XL    HTN: BP 105/48 today. No longer taking  lisinopril.        Follow-up and Dispositions    ?? Return if symptoms worsen or fail to improve.         M expressed understanding of this plan. An AVS was printed and given to the patient.     Pt discussed with [name], MD (Attending Physician)    Eugene Garnet, MD  Family Medicine Resident

## 2020-10-10 LAB — METABOLIC PANEL, BASIC
Anion gap: 3 mmol/L — ABNORMAL LOW (ref 5–15)
BUN/Creatinine ratio: 17 (ref 12–20)
BUN: 15 mg/dL (ref 6–20)
CO2: 30 mmol/L (ref 21–32)
Calcium: 8.9 mg/dL (ref 8.5–10.1)
Chloride: 107 mmol/L (ref 97–108)
Creatinine: 0.9 mg/dL (ref 0.55–1.02)
GFR est AA: >60 mL/min/{1.73_m2} (ref >60)
GFR est non-AA: >60 mL/min/{1.73_m2} (ref >60)
Glucose: 81 mg/dL (ref 65–100)
Potassium: 4.8 mmol/L (ref 3.5–5.1)
Sodium: 140 mmol/L (ref 136–145)

## 2020-10-10 LAB — BASIC METABOLIC PANEL
Anion Gap: 3 mmol/L — ABNORMAL LOW (ref 5–15)
BUN/Creatinine Ratio: 17 (ref 12–20)
BUN: 15 MG/DL (ref 6–20)
CO2: 30 mmol/L (ref 21–32)
Calcium: 8.9 MG/DL (ref 8.5–10.1)
Chloride: 107 mmol/L (ref 97–108)
Creatinine: 0.9 MG/DL (ref 0.55–1.02)
GFR African American: >60 mL/min/{1.73_m2} (ref >60)
Glucose: 81 mg/dL (ref 65–100)
Potassium: 4.8 mmol/L (ref 3.5–5.1)
Sodium: 140 mmol/L (ref 136–145)
eGFR NON-AA: >60 mL/min/{1.73_m2} (ref >60)

## 2020-10-10 MED ORDER — DOXYCYCLINE 100 MG CAP
100 mg | ORAL_CAPSULE | Freq: Two times a day (BID) | ORAL | 0 refills | Status: AC
Start: 2020-10-10 — End: 2020-10-16

## 2020-10-10 MED ORDER — CEFDINIR 300 MG CAP
300 mg | ORAL_CAPSULE | Freq: Two times a day (BID) | ORAL | 0 refills | Status: AC
Start: 2020-10-10 — End: 2020-10-16

## 2020-10-17 ENCOUNTER — Telehealth

## 2020-10-17 NOTE — Telephone Encounter (Signed)
Called pt to discuss referral to pulmonology for lung nodules. No answer. LVM with instructions to call office back.    Previous referral to pulmonology appears to have been sent to a provider in [place] who is no longer active.    Will place new referral at this time.

## 2020-10-18 NOTE — Telephone Encounter (Signed)
-----   Message from Krystal Eaton sent at 10/18/2020  9:59 AM EDT -----  Subject: Message to Provider    QUESTIONS  Information for Provider? Pt is confused on what her next steps should be   as far as her care. Please call pt to advise.  ---------------------------------------------------------------------------  --------------  Cleotis Lema INFO  [phone]; OK to leave message on voicemail  ---------------------------------------------------------------------------  --------------  SCRIPT ANSWERS  Relationship to Patient? Self

## 2020-10-18 NOTE — Telephone Encounter (Signed)
Pt would like to speak to Dr Patriciaann Clan concerning CT  Please clarify when and why testing to be done

## 2020-10-22 NOTE — Telephone Encounter (Signed)
I talked to Ms. Kristine Banks by phone.    She was feeling better after taking the antibiotics to cover for pneumonia until yesterday when she developed some shortness of breath. She reports that she is feeling better today. She felt sweaty at times, but has not measured her temperature.    Given CT findings, concern for underlying lung neoplasia that is causing persistent symptoms despite antibiotics. Could also be incompletely treated PNA. Although reassured that she felt better after treatment and current symptoms improved.  - Cautioned pt to go the the ED if she feels worse (fevers chills, increased work of breathing). Pt voiced understanding.  - Recommended pt to make an appointment to be seen in the office. Pt voiced understanding.  - Discussed plan to see Pulmonology to evaluate lung nodules. Pt voiced understanding.  - Reassured that CT scan in 6 months was per Radiologist to monitor the size of the pulmonary nodules over time. Pt voiced understanding.

## 2020-10-22 NOTE — Telephone Encounter (Signed)
 Received call from [place] at [facility] with Red Flag Complaint.    Subjective: Caller states I'm very fatigued.     Current Symptoms: Fatigue    Onset: a few days ago; Lung nodules on CT scan    Associated Symptoms: Had vomiting, diarrhea- over the weekend    Pain Severity: Denies currently    Temperature: Denies- night sweats    What has been tried: Antibiotic for pneumonia- finished on Weds., symptoms are better today per patient- has been drinking a lot of bottled water .    Per patient- provider would like to see patient this week.  Was told by PCP if symptoms come back patient should report to nearest ED for evaluation.    Recommended disposition: See PCP within 3 Days    Care advice provided, patient verbalizes understanding; denies any other questions or concerns; instructed to call back for any new or worsening symptoms.    Patient/Caller agrees with recommended disposition; Clinical research associate provided warm transfer to [place] at [facility] for appointment scheduling    Attention Provider:  Thank you for allowing me to participate in the care of your patient.  The patient was connected to triage in response to information provided to the ECC.  Please do not respond through this encounter as the response is not directed to a shared pool.    Reason for Disposition   Fatigue (i.e., tires easily, decreased energy) and persists > 1 week    Protocols used: Weakness (Generalized) and Fatigue-ADULT-[place]

## 2020-10-24 ENCOUNTER — Ambulatory Visit

## 2020-10-24 ENCOUNTER — Ambulatory Visit: Attending: Neurology | Primary: Family Medicine

## 2020-10-24 ENCOUNTER — Ambulatory Visit: Attending: Family Medicine | Primary: Family Medicine

## 2020-10-24 ENCOUNTER — Ambulatory Visit: Admit: 2020-10-24 | Payer: PRIVATE HEALTH INSURANCE | Primary: Family Medicine

## 2020-10-24 ENCOUNTER — Ambulatory Visit
Admit: 2020-10-24 | Discharge: 2020-10-24 | Payer: PRIVATE HEALTH INSURANCE | Attending: Family Medicine | Primary: Family Medicine

## 2020-10-24 ENCOUNTER — Ambulatory Visit: Admit: 2020-10-24 | Payer: PRIVATE HEALTH INSURANCE | Attending: Neurology | Primary: Family Medicine

## 2020-10-24 DIAGNOSIS — R42 Dizziness and giddiness: Secondary | ICD-10-CM

## 2020-10-24 DIAGNOSIS — R197 Diarrhea, unspecified: Secondary | ICD-10-CM

## 2020-10-24 NOTE — Progress Notes (Signed)
Chief Complaint   Patient presents with    Memory Loss     Referred by pcp for memory issues

## 2020-10-24 NOTE — Progress Notes (Signed)
[facility]    History of Present Illness:   Kristine Banks is a 65 y.o. female here for   Chief Complaint   Patient presents with    Generalized Body Aches     Nausea, vomiting, leg cramps started 10/21/20, subsided.         HPI:  N/v/d over weekend. Felt weak on Saturday. Sunday diarrhea started. Monday symptoms resolved. Still having leg cramping. Drinking electrolyte solution.   No cough.  Subjective fever.   Says she had cov19 two weeks ago.  Still sob, followed by pulmonary. Has had multiple CXR there. She does vape and smokes MJ.  Seeing neuro this afternoon for near syncope.        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      CALCIUM-MAGNESIUM-ZINC PO Take 1 Tablet by mouth daily.      levocetirizine (XYZAL) 5 mg tablet Take 5 mg by mouth daily.      metoprolol succinate (TOPROL-XL) 25 mg XL tablet       venlafaxine-SR (EFFEXOR-XR) 75 mg capsule Take 1 Capsule by mouth daily. 1 Capsule 0    multivitamin (ONE A DAY) tablet Take 1 Tablet by mouth daily.      famotidine (PEPCID) 20 mg tablet Take 20 mg by mouth daily.      aspirin 81 mg chewable tablet  Take 81 mg by mouth.      [DISCONTINUED] colestipoL (COLESTID) 1 gram tablet Take 2 Tablets by mouth daily. (Patient not taking: Reported on 10/24/2020) 180 Tablet 1     No current facility-administered medications on file prior to visit.       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       Social History     Socioeconomic History    Marital status: MARRIED   Tobacco Use    Smoking status: Former     Packs/day: 1.00     Years: 30.00     Pack years: 30.00     Types:  Cigarettes     Quit date: 03/24/2012     Years since quitting: 8.5    Smokeless tobacco: Never    Tobacco comments:     vape   Vaping Use    Vaping Use: Every day    Substances: Nicotine, THC    Devices: Pre-filled or refillable cartridge   Substance and Sexual Activity    Alcohol use: Not Currently    Drug use: Yes     Types: Marijuana     Social Determinants of Health     Financial Resource Strain: Low Risk     Difficulty of Paying Living Expenses: Not hard at all   Food Insecurity: No Food Insecurity    Worried About Charity fundraiser in the Last Year: Never true    Ran Out of Food in the Last Year: Never true        Review of Systems   Review of Systems   Constitutional:  Positive for weight loss. Negative for fever.   Respiratory:  Positive for shortness of breath.         Stable   Gastrointestinal:  Negative for abdominal pain, diarrhea, nausea and vomiting.        Resolved   Musculoskeletal:  Positive for myalgias.     Objective:   Visit Vitals  BP 118/69 (BP 1 Location: Right arm, BP Patient Position: Sitting, BP Cuff Size: Small adult)   Pulse 61   Temp 97.1 ??F (36.2 ??C) (Oral)   Resp 16   Ht 5' 6"  (1.676 m)    Wt 115 lb (52.2 kg)   SpO2 100%   BMI 18.56 kg/m??        Physical Exam  PHYSICAL EXAM:  Gen: Pt sitting in chair, in NAD  Head: Normocephalic, atraumatic  Eyes: Sclera anicteric, EOM grossly intact,  Ears: TM's pearly with good light reflex b/l  Throat: MMM, normal lips, tongue, teeth and gums  CVS: Normal S1, S2, no m/r/g  Resp: CTAB, no wheezes or rales  Abd: Soft, non-tender, non-distended, +BS  Extrem: Atraumatic, no cyanosis or edema  Pulses: 2+   Skin: Warm, dry  Neuro: Alert, oriented, appropriate        Assessment and orders:       ICD-10-CM ICD-9-CM    1. Diarrhea, unspecified type  R19.7 787.91 CBC WITH AUTOMATED DIFF      METABOLIC PANEL, COMPREHENSIVE      2. Leg cramps  R25.2 729.82 SED RATE (ESR)      3. Weight loss  R63.4 783.21 TSH 3RD GENERATION        Diagnoses and all orders for this visit:    1. Diarrhea, unspecified type  -     CBC WITH AUTOMATED DIFF; Future  -     METABOLIC PANEL, COMPREHENSIVE; Future    2. Leg cramps  -     SED RATE (ESR); Future    3. Weight loss  -     TSH 3RD GENERATION; Future    Check potassium today along with sed rate.   Advised to eat potassium rich diet as she may be hypokalemic from recent diarrhea and vomiting.    lab results and schedule of future lab studies reviewed with patient    I have discussed the diagnosis with the patient and the intended plan as seen in the above orders.  Social history, medical history, and labs were reviewed.  The patient has received an after-visit summary and questions were answered concerning  future plans.  I have discussed medication side effects and warnings with the patient as well. Patient/guardian verbalized understanding and accepts plan & risks.     Sampson Goon, MD  [facility]  10/24/20

## 2020-10-24 NOTE — Progress Notes (Signed)
 Chief Complaint   Patient presents with    Generalized Body Aches     Nausea, vomiting, leg cramps started 10/21/20, subsided.     1. Have you been to the ER, urgent care clinic since your last visit?  Hospitalized since your last visit? No    2. Have you seen or consulted any other health care providers outside of the [facility] since your last visit? No     3. For patients aged 65-75: Has the patient had a colonoscopy / FIT/ Cologuard? Yes - no Care Gap present      If the patient is female:    4. For patients aged 59-74: Has the patient had a mammogram within the past 2 years? Yes - no Care Gap present      5. For patients aged 21-65: Has the patient had a pap smear? Yes - no Care Gap present    Health Maintenance Due   Topic Date Due    Pneumococcal 0-64 years (1 - PCV) Never done    COVID-19 Vaccine (3 - Booster for Janssen series) 06/02/2020    Bone Densitometry (Dexa) Screening  12/09/2020

## 2020-10-24 NOTE — Progress Notes (Signed)
[facility] [address] [place], [place] [address]  [address] [place], [place] [address]  [phone] Office  [phone] Facsimile           Referring: Rory Percy, MD  [address]  [place],  [place] [address]     No chief complaint on file.    65 year old lady who presents today for initial neurologic consultation regarding episodes of dizziness and feeling like she is going to pass out.  Her symptoms started 3-1/2 to 4 years ago right after she moved here.  She says everything started to fall apart at that point and somebody has to figure out what is going on.  No loss of consciousness.  She describes the dizziness as a feeling of lightheadedness.  She sometimes feels like she will pass out.  She gets it with positional change.  She gets it when she is standing.  She does not pass out.  She feels like she might pass out.  She is very tangential and history is difficult to ascertain.  She says the last event she had was a week ago but then she tells me she was coughing severely and bent over on the floor because she could not breathe and then she started to cough up bright red blood and she supposed to see pulmonology.  She also notes she is not thinking clearly.  She denies focal weakness.  She notes she is generally tired.  In brief my understanding is she is having these episodes of dizziness where she feels like she is going to pass out without any other focal neurologic deficit    Record review finds office visit this morning with Dr. Elyn Peers.  She came in having nausea and vomiting over the weekend feeling weak and having diarrhea.  Symptoms resolved on Monday.  Having cramping.  History of COVID 2 weeks ago.  Reported to vape and smoke marijuana.  Reported to have episodes of near syncope and noted to be seeing neuro today.    Labs from October 09, 2020  Metabolic panel unremarkable    CT scan of the head may 2022 for complaint of altered mental status unremarkable    Emergency department note from Aug 07, 2020 or patient came in complaining nausea vomiting chills.  She apparently went to [facility] the day prior and was discharged.  She was evaluated and discharged.      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      CALCIUM-MAGNESIUM-ZINC PO Take 1 Tablet by mouth daily.      levocetirizine (XYZAL) 5 mg tablet Take 5 mg by mouth daily.  metoprolol succinate (TOPROL-XL) 25 mg XL tablet       venlafaxine-SR (EFFEXOR-XR) 75 mg capsule Take 1 Capsule by mouth daily. 1 Capsule 0    multivitamin (ONE A DAY) tablet Take 1 Tablet by mouth daily.      famotidine (PEPCID) 20 mg tablet Take 20 mg by mouth daily.      aspirin 81 mg chewable tablet Take 81 mg by mouth.          Allergies   Allergen Reactions    Ragweed Pollen Sneezing    Zofran [Ondansetron Hcl] Other (comments)     Does not respond well       Social History     Tobacco Use    Smoking status: Former     Packs/day: 1.00     Years: 30.00     Pack years: 30.00     Types: Cigarettes     Quit date: 03/24/2012     Years since quitting: 8.5    Smokeless tobacco: Never    Tobacco comments:     vape   Vaping Use    Vaping Use: Every day    Substances: Nicotine, THC    Devices: Pre-filled or refillable cartridge   Substance Use Topics    Alcohol use: Not Currently    Drug use:  Yes     Types: Marijuana   She tells me that she uses a vaporizer for 6% nicotine as well as CBD with THC for her hip and knee pain from climbing ladders.    Family History   Problem Relation Age of Onset    Hypertension Mother     Cancer Mother         glioblastoma    Thyroid Disease Mother         Unsure if overactive or underactive    Thyroid Disease Brother     Hypertension Brother     Other Brother         Factor V Leiden       Review of Systems  Pertinent positives and negatives as noted.      Examination  Visit Vitals  BP (!) 146/67 (BP 1 Location: Left upper arm, BP Patient Position: Sitting, BP Cuff Size: Adult)   Pulse 62   Resp 18   Ht 5\' 6"  (1.676 m)   Wt 52.2 kg (115 lb)   BMI 18.56 kg/m??     She is awake alert and oriented.  Some pressured speech.  Tangential.  Cranial nerves intact 2-12.  No nystagmus.  No pronation and no drift.  Strength is full in the upper and lower extremities in all muscle groups to the confrontational testing.  Her reflexes are symmetrical in the upper and lower extremities in all muscle groups to direct testing.  No pathologic reflexes.  No ataxia    Impression/Plan  65 year old lady with what sounds like episodes of dizziness/lightheadedness with presyncope  MRI of the brain  EEG  Carotid Doppler  Follow-up after    77, MD          This note was created using voice recognition software. Despite editing, there may be syntax errors.

## 2020-11-27 ENCOUNTER — Encounter: Admit: 2020-11-27 | Payer: MEDICARE | Primary: Family Medicine

## 2020-11-27 ENCOUNTER — Encounter

## 2020-11-27 NOTE — Procedures (Signed)
[facility]   EEG Report    Patient: Kristine Banks  Jul 15, 1955  Date of Service: 11/27/2020  Referring:  Sharol Roussel Adiel Mcnamara    An EEG is requested in this 65 year old lady to evaluate for epileptiform abnormalities.  Medications said to include Effexor, Pepcid, Toprol, aspirin.    This tracing is obtained during the awake state.  During wakefulness there are intermittent runs of posteriorly dominant and symmetric low to medium amplitude 10 cps activities which attenuate with eye opening.  Lower voltage faster frequency activities are seen symmetrically over the anterior head regions.    Hyperventilation is not performed.    Intermittent photic stimulation induces symmetric posterior driving responses.    Sleep is not attained.    Interpretation  This EEG recorded during the awake state is normal.    Stanford Scotland, MD

## 2020-12-02 ENCOUNTER — Encounter

## 2020-12-03 ENCOUNTER — Encounter

## 2020-12-04 MED ORDER — VENLAFAXINE SR 75 MG 24 HR CAP
75 mg | ORAL_CAPSULE | Freq: Every day | ORAL | 1 refills | Status: AC
Start: 2020-12-04 — End: ?

## 2020-12-19 ENCOUNTER — Inpatient Hospital Stay: Admit: 2020-12-19 | Payer: MEDICARE | Attending: Neurology | Primary: Family Medicine

## 2020-12-19 DIAGNOSIS — R42 Dizziness and giddiness: Secondary | ICD-10-CM

## 2020-12-19 MED ORDER — GADOTERIDOL 279.3 MG/ML INTRAVENOUS SOLUTION
279.3 mg/mL | Freq: Once | INTRAVENOUS | Status: AC
Start: 2020-12-19 — End: 2020-12-19
  Administered 2020-12-19: 17:00:00 via INTRAVENOUS

## 2020-12-19 MED FILL — PROHANCE 279.3 MG/ML INTRAVENOUS SOLUTION: 279.3 mg/mL | INTRAVENOUS | Qty: 20

## 2020-12-25 ENCOUNTER — Ambulatory Visit: Attending: Family | Primary: Family Medicine

## 2020-12-25 ENCOUNTER — Encounter: Admit: 2020-12-25 | Payer: MEDICARE | Attending: Family | Primary: Family Medicine

## 2020-12-25 DIAGNOSIS — R42 Dizziness and giddiness: Secondary | ICD-10-CM

## 2020-12-25 MED ORDER — ATOMOXETINE 25 MG CAP
25 mg | ORAL_CAPSULE | Freq: Every day | ORAL | 5 refills | Status: AC
Start: 2020-12-25 — End: ?

## 2020-12-25 NOTE — Progress Notes (Signed)
Kristine Banks is a 65 y.o. female who presents with the following  Chief Complaint   Patient presents with    Follow-up     Patient is following up for results for MRI,EEG,Doppler       HPI    Follow-up for MRI EEG Doppler  She does feel like the dizziness is significantly less since last visit  She has been on allergy medication since last visit and doing a lot better  The symptoms are few and far in between    She does still notice her memory fog though  She feels little bit slower or cloudy  She is not really sure what the cause of this was  She has some trouble with remembering dates and names and specific things during the day  She has a couple issues with expression and processing  Has never used Strattera  She does have a little bit of anxiety but not to the point where she thinks it is inhibiting her much  She is on Effexor     Allergies   Allergen Reactions    Ragweed Pollen Sneezing    Zofran [Ondansetron Hcl] Other (comments)     Does not respond well       Current Outpatient Medications   Medication Sig    tiotropium (Spiriva with HandiHaler) 18 mcg inhalation capsule Take 1 Capsule by inhalation daily. Once in the morning    albuterol (ACCUNEB) 0.63 mg/3 mL nebulizer solution 0.63 mg by Nebulization route every six (6) hours as needed for Wheezing.    atomoxetine (Strattera) 25 mg capsule Take 1 Capsule by mouth daily (with breakfast).    venlafaxine-SR (EFFEXOR-XR) 75 mg capsule Take 1 Capsule by mouth daily.    ascorbic acid, vitamin C, (VITAMIN C) 500 mg tablet Take 4,000 mg by mouth in the morning.    CALCIUM-MAGNESIUM-ZINC PO Take 1 Tablet by mouth daily.    metoprolol succinate (TOPROL-XL) 25 mg XL tablet     multivitamin (ONE A DAY) tablet Take 1 Tablet by mouth daily.    famotidine (PEPCID) 20 mg tablet Take 20 mg by mouth daily.    aspirin 81 mg chewable tablet Take 81 mg by mouth.     No current facility-administered medications for this visit.       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    Ventricular premature beats 08/08/2020       Past Surgical History:   Procedure Laterality Date    HX BREAST AUGMENTATION Bilateral 2011    HX BUNIONECTOMY      HX CHOLECYSTECTOMY  2021       Family History   Problem Relation Age of Onset    Hypertension Mother     Cancer Mother         glioblastoma    Thyroid Disease Mother  Unsure if overactive or underactive    Thyroid Disease Brother     Hypertension Brother     Other Brother         Factor V Leiden       Social History     Socioeconomic History    Marital status: MARRIED   Tobacco Use    Smoking status: Former     Packs/day: 1.00     Years: 30.00     Pack years: 30.00     Types: Cigarettes     Quit date: 03/24/2012     Years since quitting: 8.7    Smokeless tobacco: Never    Tobacco comments:     vape   Vaping Use    Vaping Use: Every day    Substances: Nicotine, THC    Devices: Pre-filled or refillable cartridge   Substance and Sexual Activity    Alcohol use: Not Currently    Drug use: Yes     Types: Marijuana     Social Determinants of Health     Financial Resource Strain: Low Risk     Difficulty of Paying Living Expenses: Not hard at all   Food Insecurity: No Food Insecurity    Worried About Programme researcher, broadcasting/film/video in the Last Year: Never true    Ran Out of Food in the Last Year: Never true       Review of Systems   Eyes:  Negative for blurred vision, double vision and photophobia.   Gastrointestinal:  Negative for nausea and vomiting.   Neurological:  Positive for dizziness. Negative for tingling, tremors, sensory  change, seizures, loss of consciousness and headaches.     Remainder of comprehensive review is negative.     Physical Exam :    Visit Vitals  BP 123/61   Pulse (!) 59   Resp 18   Ht 5\' 6"  (1.676 m)   Wt 55.3 kg (122 lb)   SpO2 98%   BMI 19.69 kg/m??     Results for orders placed or performed during the hospital encounter of 10/09/20   BUN   Result Value Ref Range    BUN 14 6 - 20 mg/dL   CREATININE AND GFR   Result Value Ref Range    Creatinine 0.95 0.55 - 1.02 mg/dL    GFR est AA 10/11/20 >67 >67    GFR est non-AA 59 (L) >60 ml/min/1.46m2       Orders Placed This Encounter    tiotropium (Spiriva with HandiHaler) 18 mcg inhalation capsule     Sig: Take 1 Capsule by inhalation daily. Once in the morning    albuterol (ACCUNEB) 0.63 mg/3 mL nebulizer solution     Sig: 0.63 mg by Nebulization route every six (6) hours as needed for Wheezing.    atomoxetine (Strattera) 25 mg capsule     Sig: Take 1 Capsule by mouth daily (with breakfast).     Dispense:  30 Capsule     Refill:  5       1. Dizzy    2. Attention deficit hyperactivity disorder (ADHD), predominantly inattentive type      Discussed her MRI, EEG, and carotid Doppler  We will hold off on ANS testing as the dizziness has gotten significantly better with taking medication for allergies  Her memory fog is still concerning to her  We will try Strattera 25 mg in the morning and titrate accordingly  Discussed side effects  Follow-up 6 to 8 weeks to  reevaluate              This note will not be viewable in MyChart

## 2020-12-25 NOTE — Progress Notes (Signed)
 Patient reports that she has to stop and think all the tie for the simple day to day things. Like her brain isn't connecting.        Visit Vitals  BP 123/61   Pulse (!) 59   Resp 18   Ht 5' 6 (1.676 m)   Wt 122 lb (55.3 kg)   SpO2 98%   BMI 19.69 kg/m      Chief Complaint   Patient presents with    Follow-up     Patient is following up for results for MRI,EEG,Doppler

## 2021-01-31 ENCOUNTER — Ambulatory Visit: Payer: MEDICARE | Attending: Family Medicine | Primary: Family Medicine

## 2021-02-26 NOTE — Telephone Encounter (Signed)
 Location of patient: [facility]     Received call from [place] at [facility] with Red Flag Complaint.    Subjective: Caller states having flu like symptoms, feeling weak and tired     Current Symptoms: Saturday night started having leg cramps  Happened one other time and electrolytes were off so she drank electrolytes and it was relieved.  Yesterday face was red but no fever  Waking up drenched.  Was able to eat yesterday.  This morning feeling crappy  Feeling extremely weak and tired.  Was able to take a shower  Heart seems to be beating harder       Onset: 3 days ago; gradual    Associated Symptoms: NA    Pain Severity:     Temperature: denies     What has been tried: tylneol and ibuprofen    LMP: NA Pregnant: NA    Recommended disposition: See in Office Today    Care advice provided, patient verbalizes understanding; denies any other questions or concerns; instructed to call back for any new or worsening symptoms.    Patient not wanting appt, states she will reach out personally to PCP. Patient hung up the call.    Attention Provider:  Thank you for allowing me to participate in the care of your patient.  The patient was connected to triage in response to information provided to the [facility].  Please do not respond through this encounter as the response is not directed to a shared pool.    Reason for Disposition   MODERATE weakness (i.e., interferes with work, school, normal activities) and persists > 3 days    Protocols used: Weakness (Generalized) and Fatigue-ADULT-OH

## 2021-03-08 NOTE — Telephone Encounter (Signed)
-----   Message from Westley Chandler sent at 03/08/2021  1:35 PM EST -----  Subject: Appointment Request    Reason for Call: Established Patient Appointment needed: Routine (Patient   Request) No Script    QUESTIONS    Reason for appointment request? Available appointments did not meet   patient need     Additional Information for Provider? Pt calling as she has thrush in mouth   Please call with instructions.  ---------------------------------------------------------------------------  --------------  Cleotis Lema INFO  [phone]; OK to leave message on voicemail  ---------------------------------------------------------------------------  --------------  SCRIPT ANSWERS  COVID Screen: [place] Si

## 2021-03-11 NOTE — Telephone Encounter (Signed)
Mychart message sent

## 2021-03-19 NOTE — Telephone Encounter (Signed)
Pt was offered December 30th. She said she could not wait until because she has Thrush in her mouth. I apologized and let her know that was all we had. Pt declined.

## 2021-03-20 ENCOUNTER — Telehealth: Admit: 2021-03-20 | Discharge: 2021-03-20 | Payer: MEDICARE | Attending: Family Medicine | Primary: Family Medicine

## 2021-03-20 NOTE — Telephone Encounter (Signed)
 Call made to pt regarding the several recent mychart messages and telephone calls that have already been addressed. Pt made aware that her demanding behavior is unacceptable and if it continues, she will be dismissed from the office. Pt made aware that she has not been seen in the office since August so an appointment is needed and that when we tell her that, it is no need to demand to talk to Dr. Maximo. She was made aware that Dr. Maximo could not talk but was still insisting she spoke with her. Pt made aware again that Dr Maximo, nor any other provider, would write her a prescription without being seen. She insisted she get her medication without an appt. Pt was also reminded that we respect her and that she needs to respect the staff just the same, or she will be dismissed. When talking with the pt, she would over talk me, cut me off, raise her voice, and not let me finish my sentences, even after me saying I let you talk so now it is my turn to talk please Pt was still being very argumentative so I told her that I was ending the call and she could speak to the doctor at her appointment and I disconnected the call. Montie, LPN, was in the room to witness the conversation between us .

## 2021-03-20 NOTE — Telephone Encounter (Signed)
Pt states she has an issue with her chart stating she has Coranary Artery Disease. Pt states she does not have this and that her insurance has dropped her due to this misdiagnosis. Please advise

## 2021-03-20 NOTE — Telephone Encounter (Signed)
Pt has an appt scheduled on 03-22-21  Will be able to discuss diagnosis with provider at this time    Will forward to provider

## 2021-03-22 ENCOUNTER — Encounter: Payer: MEDICARE | Attending: Student in an Organized Health Care Education/Training Program | Primary: Family Medicine

## 2021-03-22 ENCOUNTER — Encounter: Attending: Family Medicine

## 2021-03-22 NOTE — Telephone Encounter (Signed)
Telephone Encounter by Eugene Garnet, MD at 03/22/21 [phone]                Author: Eugene Garnet, MD  Service: --  Author Type: Physician       Filed: 03/22/21 [id number]  Encounter Date: 03/19/2021  Status: Signed          Editor: Eugene Garnet, MD (Physician)          Bryson Dames, LPN 09/81/1914  9:15 AM EST----- Message -----From: Timmothy Sours ShapiroSent: 03/20/2021   9:03 AM ESTTo: [facility] Nurse PoolSubject: Severe  Oral Thrush                           I want Dr. Sanda Klein to call me anyway.

## 2021-03-27 ENCOUNTER — Inpatient Hospital Stay: Admit: 2021-03-27 | Payer: MEDICARE | Primary: Family Medicine

## 2021-03-27 DIAGNOSIS — J189 Pneumonia, unspecified organism: Secondary | ICD-10-CM

## 2021-03-28 ENCOUNTER — Encounter: Payer: MEDICARE | Attending: Family | Primary: Family Medicine

## 2021-03-28 ENCOUNTER — Ambulatory Visit: Admit: 2021-03-28 | Payer: MEDICARE | Attending: Family | Primary: Family Medicine

## 2021-03-28 DIAGNOSIS — R413 Other amnesia: Secondary | ICD-10-CM

## 2021-03-28 NOTE — Progress Notes (Signed)
Dizziness seems to be ok, only really happens when out of breath  Recently had the flu, reports being unconscious for 2 days, husband said she was delirious   Memory is jumping all over the place  Kristine Banks she believes her immune system is shot and believes this is neurological

## 2021-03-28 NOTE — Progress Notes (Signed)
Kristine Banks is a 66 y.o. female who presents with the following  Chief Complaint   Patient presents with    Follow-up       HPI    FU testing   Continue to have memory fog  She has not seen much of a difference in her overall symptoms       She does still notice her memory fog though  She feels little bit slower or cloudy  She is not really sure what the cause of this was  She has some trouble with remembering dates and names and specific things during the day  She has a couple issues with expression and processing        Allergies   Allergen Reactions    Ragweed Pollen Sneezing    Zofran [Ondansetron Hcl] Other (comments)     Does not respond well       Current Outpatient Medications   Medication Sig    montelukast (SINGULAIR) 10 mg tablet     tiotropium (SPIRIVA) 18 mcg inhalation capsule Take 1 Capsule by inhalation daily. Once in the morning    albuterol (ACCUNEB) 0.63 mg/3 mL nebulizer solution 0.63 mg by Nebulization route every six (6) hours as needed for Wheezing.    atomoxetine (Strattera) 25 mg capsule Take 1 Capsule by mouth daily (with breakfast).    venlafaxine-SR (EFFEXOR-XR) 75 mg capsule Take 1 Capsule by mouth daily.    ascorbic acid, vitamin C, (VITAMIN C) 500 mg tablet Take 4,000 mg by mouth in the morning.    CALCIUM-MAGNESIUM-ZINC PO Take 1 Tablet by mouth daily.    metoprolol succinate (TOPROL-XL) 25 mg XL tablet     multivitamin (ONE A DAY) tablet Take 1 Tablet by mouth daily.    famotidine (PEPCID) 20 mg tablet Take 20 mg by mouth daily.    aspirin 81 mg chewable tablet Take 81 mg by mouth.     No current facility-administered medications for this visit.       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    Ventricular premature beats 08/08/2020       Past Surgical History:   Procedure Laterality Date    HX BREAST AUGMENTATION Bilateral 2011    HX BUNIONECTOMY      HX CHOLECYSTECTOMY  2021       Family History   Problem Relation Age of Onset    Hypertension Mother     Cancer Mother         glioblastoma    Thyroid Disease Mother         Unsure if overactive or underactive    Thyroid Disease Brother     Hypertension Brother     Other Brother         Factor V Leiden       Social History  Socioeconomic History    Marital status: MARRIED   Tobacco Use    Smoking status: Former     Packs/day: 1.00     Years: 30.00     Pack years: 30.00     Types: Cigarettes     Quit date: 03/24/2012     Years since quitting: 9.0    Smokeless tobacco: Never    Tobacco comments:     vape   Vaping Use    Vaping Use: Every day    Substances: Nicotine, THC    Devices: Pre-filled or refillable cartridge   Substance and Sexual Activity    Alcohol use: Not Currently    Drug use: Yes     Types: Marijuana     Social Determinants of Health     Financial Resource Strain: Low Risk     Difficulty of Paying Living Expenses: Not hard at all   Food Insecurity: No Food Insecurity    Worried About Programme researcher, broadcasting/film/video in the Last Year: Never true    Ran Out of Food in the Last Year: Never true       Review of Systems   Eyes:  Negative for blurred vision, double vision and photophobia.   Respiratory:  Negative for shortness of breath and wheezing.    Cardiovascular:  Negative for chest pain and palpitations.   Neurological:  Negative for dizziness, tingling, seizures, loss of consciousness and headaches.   Psychiatric/Behavioral:  Positive for memory loss.      Remainder of comprehensive review is negative.     Physical Exam :    Visit Vitals  BP (!) 160/58 (BP 1 Location: Left upper arm, BP Patient  Position: Sitting, BP Cuff Size: Adult)   Wt 52.2 kg (115 lb)   BMI 18.56 kg/m??       Results for orders placed or performed during the hospital encounter of 10/09/20   BUN   Result Value Ref Range    BUN 14 6 - 20 mg/dL   CREATININE AND GFR   Result Value Ref Range    Creatinine 0.95 0.55 - 1.02 mg/dL    GFR est AA >01 >02 VO/ZDG/6.44I3    GFR est non-AA 59 (L) >60 ml/min/1.37m2       Orders Placed This Encounter    [facility]     Referral Priority:   Routine     Referral Type:   Consultation     Referral Reason:   Specialty Services Required     Referred to Provider:   Annitta Needs, PsyD     Number of Visits Requested:   1    montelukast (SINGULAIR) 10 mg tablet       1. Memory loss      Discussed testing in full     Refer to neuropsychiatry for memory evaluation.   Continue to keep brain active and engaged.               This note will not be viewable in MyChart

## 2021-04-08 NOTE — Telephone Encounter (Signed)
Returning your call to schedule NP appt.

## 2021-04-08 NOTE — Telephone Encounter (Signed)
Patient is returning Dominique's call to schedule a NP appointment.      Please contact

## 2021-04-12 NOTE — Telephone Encounter (Signed)
Patient calling back  to schedule NP appt

## 2021-04-15 ENCOUNTER — Encounter: Payer: MEDICARE | Attending: Family Medicine | Primary: Family Medicine

## 2021-04-16 ENCOUNTER — Encounter

## 2021-04-19 ENCOUNTER — Ambulatory Visit: Attending: Student in an Organized Health Care Education/Training Program | Primary: Family Medicine

## 2021-04-19 ENCOUNTER — Ambulatory Visit
Admit: 2021-04-19 | Discharge: 2021-04-19 | Payer: MEDICARE | Attending: Student in an Organized Health Care Education/Training Program | Primary: Family Medicine

## 2021-04-19 DIAGNOSIS — I2510 Atherosclerotic heart disease of native coronary artery without angina pectoris: Secondary | ICD-10-CM

## 2021-04-19 NOTE — Addendum Note (Signed)
Addendum Note by Eugene Garnet, MD at 04/19/21 0820                Author: Eugene Garnet, MD  Service: --  Author Type: Physician       Filed: 04/23/21 1559  Encounter Date: 04/19/2021  Status: Signed          Editor: Eugene Garnet, MD (Physician)          Addended by: Eugene Garnet on: 04/23/2021 03:59 PM    Modules accepted: Level of Service

## 2021-04-19 NOTE — Progress Notes (Signed)
1. Have you been to the ER, urgent care clinic since your last visit?  Hospitalized since your last visit?No    2. Have you seen or consulted any other health care providers outside of the [facility] since your last visit?  Include any pap smears or colon screening. No  Reviewed record in preparation for visit and have necessary documentation  Pt did not bring medication to office visit for review  opportunity was given for questions      3. For patients aged 96-75: Has the patient had a colonoscopy / FIT/ Cologuard? No      If the patient is female:    4. For patients aged 12-74: Has the patient had a mammogram within the past 2 years? No      5. For patients aged 21-65: Has the patient had a pap smear? NA - based on age or sex      Goals that were addressed and/or need to be completed during or after this appointment include   Health Maintenance Due   Topic Date Due    Pneumococcal 65+ years (1 - PCV) Never done    COVID-19 Vaccine (3 - Booster for Janssen series) 03/30/2020    Flu Vaccine (1) 10/22/2020    Medicare Yearly Exam  Never done    Bone Densitometry (Dexa) Screening  Never done

## 2021-04-19 NOTE — Progress Notes (Signed)
Progress  Notes by Adah Salvage, MD at 04/19/21 0820                Author: Adah Salvage, MD  Service: --  Author Type: Resident       Filed: 04/21/21 1216  Encounter Date: 04/19/2021  Status: Signed          Editor: Adah Salvage, MD (Resident)                        [place]           Chief Complaint:     No chief complaint on file.         MARYLENE MASEK is a 66 y.o. female that presents for: Discussion of health concerns           Assessment/Plan:     I personally reviewed the following Pertinent Labs/Studies:    - Encounter notes, labs, and imaging from 02/25/20.      Pt is a 66 year old female with known mild, nonobstructive CAD w/out angina, HTN, CKDIII, and MDD presenting to discuss confusion over prior medical diagnosis and denial of a life insurance claim given this diagnosis, who upon evaluation expressed suicidal  ideation.      Diagnoses and all orders for this visit:      1. Coronary artery disease involving native coronary artery of native heart without angina pectoris: diagnosis was made based on results of a coronary  angiogram on 02/25/20 after patient had presented with chest pain thought to be CAD but later discovered to be 2/2 cholecystitis. Pt still feels this is not the correct diagnosis despite being counseled on the results of the angiogram. It was also made  known to her that an EKG from 10/09/20 did show evidence of an old anterior MI. The patient repeatedly states since she isn't taking a statin that she can't have CAD however cardiology has written in there noted that they recommended she take a statin  but she refuses. She was advised today that she should be on a statin, aspirin, and an ACEi/ARB for her BP but she states she will not take any more prescription medications.    - Recommendations as above, pt was told we could refer her to another cardiologist if she desired but that this was unlikely to change her current diagnosis; she was also again  advised to begin a daily statin - she is refusing additional medicines so  handout was given on healthy lifestyle modifications to improve cholesterol      2. Suicidal ideations: pt states she is very upset and frustrated with the medical system and prior to leaving the office stated she was having thoughts  of suicide. We expressed our shared concern and empathy of her situation and asked that she please stay so we could talk with her more about her thoughts of self harm. She refused to remain in the office and left abruptly. Given her risk of self harm  the authorities were notified per recommendation from the [facility] and it was requested that they make a house visit for a welfare check. When they arrived she was expressing homicidal ideation toward Dr. Elveria Royals, but later stated she didn't mean  it and would just "pull her hair". She spoke with a mental health provider from [facility] who decided she met criteria for a TDO given her passive suicidal and homicidal ideations, erratic behavior, and verbal incoherence. Dr. Elveria Royals and  I were made  aware of the developments via the mental health provider who called the office. As below, pt's behavior has recently become more erratic and aggressive over the past few months, and there is concern that this could be related to being started on Strattera  around the time these behaviors began.    - As above, at the advise of [facility], local authorities notified so that a welfare check could be performed       3. Essential hypertension: pt's BP today is uncontrolled at 178/64. Was on Metoprolol in the past, unclear if she is still taking it like she is supposed  to. Had been on an ACEi in the past put this was stopped because her BP was dropping too low. Was previously well-controlled but may be elevated now as she was started on Strattera for ADHD by neurology in October. Pt's emotional distress ay also be playing  a role. Pt advised to follow up with  neurology to discuss stopping or changing Strattera given her HTN and that she may need additional medication if BP remains elevated. Again the pt stated that she would refuse to take any more prescription medications.  Pt was counseled on the risks of untreated HTN including but not limited to stroke or subsequent MI.    - As above, would recommend stopping Strattera and adding additional anti-hypertensives if pt remains hypertensive.               Follow up:          Follow-up and Dispositions      ??  Return in about 17 days (around 05/06/2021) for Medicare Wellness.                   Subjective:     HPI:   TAHARI CLABAUGH is a 66 y.o. female that presents for:      Discussion of her prior diagnosis of CAD. Pt states that she has been denied life insurance due to this diagnosis and would like it removed from her record. She is stating again that her hospitalization was secondary to cholecystitis and Takotsubo cardiomyopathy  and the CAD diagnosis was wrong, despite having had it explained to her that the CAD diagnosis was made based on results of a coronary angiogram. She states she does not want to take any other prescription medication and doesn't trust any doctors as she  feels she has not been communicated with and been given conflicting information. She states that she is (understandably) very frustrated and was suicidal at a prior visit and the doctor (she did not know who) ignored her concerns.  When asked if she is  still having those feelings the patient states that she is.       Health Maintenance:     Health Maintenance Due        Topic  Date Due         ?  Pneumococcal 65+ years (1 - PCV)  Never done     ?  COVID-19 Vaccine (3 - Booster for Janssen series)  03/30/2020     ?  Flu Vaccine (1)  10/22/2020     ?  Medicare Yearly Exam   Never done         ?  Bone Densitometry (Dexa) Screening   Never done            Review of Systems    Psychiatric/Behavioral:  Positive for depression  and suicidal ideas.  The patient is nervous/anxious .            Past medical history, social history, and medications personally reviewed.     Past Medical History:        Diagnosis  Date         ?  Anxiety       ?  Depression       ?  Hypertension       ?  Microscopic colitis  07/03/2020     ?  Myocardial infarction [facility])       ?  Pancreatitis  2004     ?  Recovering alcoholic ([place])       ?  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  Ventricular premature beats  08/08/2020            Allergies personally reviewed.     Allergies        Allergen  Reactions         ?  Ragweed Pollen  Sneezing     ?  Zofran [Ondansetron Hcl]  Other (comments)             Does not respond well                 Objective:     Vitals reviewed.   Visit Vitals      BP  (!) 178/64     Pulse  74     Temp  97.5 ??F (36.4 ??C)     Resp  20     Ht  '5\' 6"'  (1.676 m)     Wt  122 lb (55.3 kg)     SpO2  99%        BMI  19.69 kg/m??              Wt Readings from Last 3 Encounters:        04/19/21  122 lb (55.3 kg)     03/28/21  115 lb (52.2 kg)        12/25/20  122 lb (55.3 kg)            Physical Exam    Constitutional:        Appearance: She is underweight. She is not ill-appearing.     Neurological:       General: No focal deficit present.       Mental Status: She is alert and oriented to person, place, and time.     Psychiatric:          Attention and Perception: Attention normal.          Mood and Affect: Mood is anxious. Affect is angry .          Speech: Speech normal. Speech is not slurred or tangential.          Behavior: Behavior is uncooperative and agitated . Behavior is not aggressive or combative.          Thought Content: Thought content includes suicidal ideation. Thought content does not include homicidal ideation.          Cognition and Memory: Cognition normal.          Judgment: Judgment is impulsive.              I have reviewed pertinent labs results and other data. I have discussed the diagnosis with the patient and the intended plan as seen in  the above orders. The patient  has received an after-visit summary  and questions were answered concerning future plans. I have discussed medication side effects and warnings with the patient as well.      Pt discussed with Dr. Elveria Royals (attending physician)      Adah Salvage, MD   04/19/21

## 2021-04-19 NOTE — Progress Notes (Signed)
I saw and evaluated the patient, performing the key elements of the service.  I discussed the findings, assessment and plan with the resident and agree with the resident's findings and plan as documented in the resident's note.     Pt in office with express purpose to discuss her diagnosis of CAD and having it removed from the chart. We have discussed this several times via MyChart, but explained again today that her Cardiologist did a heart catheterization and found mild, non-obstructive CAD, and that this diagnosis cannot be removed from the medical record. Expressed empathy for her frustration as this has apparently caused some problems with her insurance and medical bills, and she has been very upset about it. She mentioned referral to a new Cardiologist for a second opinion and I advised her that we could certainly put this in but that it would be very unlikely for them to recommend a change in the diagnosis because of the objective evidence supporting it from her cath. Again expressed empathy for the stress she was feeling from this situation.     Also discussed that her BP was elevated today and pt states it has been running high since October when she was apparently started on Strattera by her Neurologist for a new diagnosis of ADD. Discussed that Strattera can cause elevated BP and I recommended that she stop taking the medication and reach out to her Neurologist for other recommendations that would not cause this problem. Advised that we would need to recheck the BP in our office again as well once she had been off the medication for a few weeks. She seemed hesitant to stop the medication, so we discussed risks of leaving her BP elevated including, but not limited, to stroke.     Patient became very upset and stated that she had felt suicidal because of all the stress related to her CAD diagnosis and the resultant problems with her insurance. I asked it she was still feeling suicidal and she said yes, but  when I attempted to ask more questions about this she said she was leaving, got up and left the office. I called and spoke with the [facility] and they recommended calling the non-emergency police number to send a welfare check for the patient. We called the police and requested they do the welfare check and they consented.     On a separate note, pt has been displaying some erratic and aggressive behavior over the past few months, both in the language of her MyChart messages and in the office. Earlier this week she stuck her head through the opening of the glass window at the front desk to yell at the [facility] when she was asked to reschedule her appointment because she was 30 minutes late. She has been advised over the phone and in writing of the behavior standards that we expect in the office. Her husband has also reached out to express his concerns about her mental health.     Addendum: Received a call several hours after the pt left the office from the [facility] requesting more information about why the welfare check was called. I explained the situation from today. The [facility] representative stated that the patient was displaying erratic behavior and was difficult to understand and that they were going to initiate a TDO. He also stated that he had a duty to inform me that the patient had threatened several times to kill me and in other ways cause me bodily harm. He stated that  she went back and forth on this but that it was his obligation to inform me of these threats.

## 2021-05-06 ENCOUNTER — Encounter: Attending: Family Medicine

## 2021-05-20 ENCOUNTER — Encounter

## 2021-05-27 ENCOUNTER — Encounter: Payer: MEDICARE | Attending: Family

## 2021-06-03 MED ORDER — CLONIDINE 0.1 MG TAB
0.1 mg | ORAL_TABLET | Freq: Two times a day (BID) | ORAL | 4 refills | Status: AC
Start: 2021-06-03 — End: ?

## 2021-07-13 ENCOUNTER — Encounter

## 2021-08-04 ENCOUNTER — Encounter: Payer: Self-pay | Admitting: Emergency Medicine

## 2021-08-04 ENCOUNTER — Other Ambulatory Visit: Payer: Self-pay

## 2021-08-04 ENCOUNTER — Emergency Department: Payer: Medicare Other

## 2021-08-04 DIAGNOSIS — I2510 Atherosclerotic heart disease of native coronary artery without angina pectoris: Secondary | ICD-10-CM | POA: Diagnosis present

## 2021-08-04 DIAGNOSIS — K529 Noninfective gastroenteritis and colitis, unspecified: Secondary | ICD-10-CM | POA: Diagnosis present

## 2021-08-04 DIAGNOSIS — E872 Acidosis, unspecified: Secondary | ICD-10-CM | POA: Diagnosis present

## 2021-08-04 DIAGNOSIS — Z79899 Other long term (current) drug therapy: Secondary | ICD-10-CM

## 2021-08-04 DIAGNOSIS — E785 Hyperlipidemia, unspecified: Secondary | ICD-10-CM | POA: Diagnosis present

## 2021-08-04 DIAGNOSIS — F419 Anxiety disorder, unspecified: Secondary | ICD-10-CM | POA: Diagnosis present

## 2021-08-04 DIAGNOSIS — Z8249 Family history of ischemic heart disease and other diseases of the circulatory system: Secondary | ICD-10-CM

## 2021-08-04 DIAGNOSIS — I10 Essential (primary) hypertension: Secondary | ICD-10-CM | POA: Diagnosis present

## 2021-08-04 DIAGNOSIS — J302 Other seasonal allergic rhinitis: Secondary | ICD-10-CM | POA: Diagnosis present

## 2021-08-04 DIAGNOSIS — I160 Hypertensive urgency: Principal | ICD-10-CM | POA: Diagnosis present

## 2021-08-04 DIAGNOSIS — K5730 Diverticulosis of large intestine without perforation or abscess without bleeding: Secondary | ICD-10-CM | POA: Diagnosis present

## 2021-08-04 DIAGNOSIS — R110 Nausea: Secondary | ICD-10-CM | POA: Diagnosis not present

## 2021-08-04 DIAGNOSIS — N179 Acute kidney failure, unspecified: Secondary | ICD-10-CM | POA: Diagnosis present

## 2021-08-04 DIAGNOSIS — I21A1 Myocardial infarction type 2: Secondary | ICD-10-CM | POA: Diagnosis present

## 2021-08-04 DIAGNOSIS — Z888 Allergy status to other drugs, medicaments and biological substances status: Secondary | ICD-10-CM

## 2021-08-04 DIAGNOSIS — Z9049 Acquired absence of other specified parts of digestive tract: Secondary | ICD-10-CM

## 2021-08-04 NOTE — ED Triage Notes (Signed)
Pt to ED via [facility] with c/o generalized abd pain, weakness, N/V since Friday. Pt states hx of HTN, has taken her BP medication. Pt also c/o rapid RR. Pt states she feels like her BP is "stroke level". Pt's husband reports pt has been diaphoretic and clammy to touch. Pt appears uncomfortable in triage.  ? ?Per EMS pt with 20G to L arm, 4mg  Zofran given en route. CBG 271. Per EMS pt showing A-fib with no hx of a-fib however takes metoprolol at home.  ? ? ?Pt denies pain unless laying down. Pt states if laying down, then pain would be severe with severe chills, and cold sweats.  ?

## 2021-08-05 ENCOUNTER — Inpatient Hospital Stay
Admission: EM | Admit: 2021-08-05 | Discharge: 2021-08-07 | DRG: 281 | Disposition: A | Discharge status: Home / Self Care | Payer: Medicare Other | Attending: Internal Medicine | Admitting: Internal Medicine

## 2021-08-05 ENCOUNTER — Emergency Department: Payer: Medicare Other

## 2021-08-05 ENCOUNTER — Other Ambulatory Visit: Payer: Self-pay

## 2021-08-05 ENCOUNTER — Encounter: Payer: Self-pay | Admitting: Radiology

## 2021-08-05 DIAGNOSIS — N179 Acute kidney failure, unspecified: Secondary | ICD-10-CM | POA: Diagnosis present

## 2021-08-05 DIAGNOSIS — R000 Tachycardia, unspecified: Secondary | ICD-10-CM | POA: Diagnosis not present

## 2021-08-05 DIAGNOSIS — I160 Hypertensive urgency: Secondary | ICD-10-CM | POA: Diagnosis present

## 2021-08-05 DIAGNOSIS — K529 Noninfective gastroenteritis and colitis, unspecified: Secondary | ICD-10-CM | POA: Diagnosis present

## 2021-08-05 DIAGNOSIS — R079 Chest pain, unspecified: Secondary | ICD-10-CM | POA: Diagnosis not present

## 2021-08-05 DIAGNOSIS — I21A1 Myocardial infarction type 2: Secondary | ICD-10-CM | POA: Diagnosis present

## 2021-08-05 DIAGNOSIS — E8729 Other acidosis: Secondary | ICD-10-CM

## 2021-08-05 DIAGNOSIS — R110 Nausea: Secondary | ICD-10-CM | POA: Diagnosis present

## 2021-08-05 DIAGNOSIS — R778 Other specified abnormalities of plasma proteins: Secondary | ICD-10-CM | POA: Diagnosis not present

## 2021-08-05 DIAGNOSIS — R002 Palpitations: Secondary | ICD-10-CM | POA: Diagnosis not present

## 2021-08-05 DIAGNOSIS — I10 Essential (primary) hypertension: Secondary | ICD-10-CM

## 2021-08-05 DIAGNOSIS — Z9049 Acquired absence of other specified parts of digestive tract: Secondary | ICD-10-CM | POA: Diagnosis not present

## 2021-08-05 DIAGNOSIS — I214 Non-ST elevation (NSTEMI) myocardial infarction: Principal | ICD-10-CM

## 2021-08-05 DIAGNOSIS — F419 Anxiety disorder, unspecified: Secondary | ICD-10-CM | POA: Diagnosis present

## 2021-08-05 DIAGNOSIS — Z79899 Other long term (current) drug therapy: Secondary | ICD-10-CM | POA: Diagnosis not present

## 2021-08-05 DIAGNOSIS — Z8249 Family history of ischemic heart disease and other diseases of the circulatory system: Secondary | ICD-10-CM | POA: Diagnosis not present

## 2021-08-05 DIAGNOSIS — I248 Other forms of acute ischemic heart disease: Secondary | ICD-10-CM | POA: Diagnosis not present

## 2021-08-05 DIAGNOSIS — I2510 Atherosclerotic heart disease of native coronary artery without angina pectoris: Secondary | ICD-10-CM | POA: Diagnosis present

## 2021-08-05 DIAGNOSIS — E785 Hyperlipidemia, unspecified: Secondary | ICD-10-CM | POA: Diagnosis present

## 2021-08-05 DIAGNOSIS — E872 Acidosis, unspecified: Secondary | ICD-10-CM | POA: Diagnosis present

## 2021-08-05 DIAGNOSIS — K5730 Diverticulosis of large intestine without perforation or abscess without bleeding: Secondary | ICD-10-CM | POA: Diagnosis present

## 2021-08-05 DIAGNOSIS — R7989 Other specified abnormal findings of blood chemistry: Secondary | ICD-10-CM | POA: Diagnosis present

## 2021-08-05 DIAGNOSIS — J302 Other seasonal allergic rhinitis: Secondary | ICD-10-CM | POA: Diagnosis present

## 2021-08-05 DIAGNOSIS — Z888 Allergy status to other drugs, medicaments and biological substances status: Secondary | ICD-10-CM | POA: Diagnosis not present

## 2021-08-05 HISTORY — DX: Acute pancreatitis without necrosis or infection, unspecified: K85.90

## 2021-08-05 HISTORY — DX: Atherosclerotic heart disease of native coronary artery without angina pectoris: I25.10

## 2021-08-05 HISTORY — DX: Essential (primary) hypertension: I10

## 2021-08-05 HISTORY — DX: Other acidosis: E87.29

## 2021-08-05 LAB — BASIC METABOLIC PANEL
Anion gap: 13 (ref 5–15)
BUN: 13 mg/dL (ref 8–23)
CO2: 24 mmol/L (ref 22–32)
Calcium: 8.7 mg/dL — ABNORMAL LOW (ref 8.9–10.3)
Chloride: 98 mmol/L (ref 98–111)
Creatinine, Ser: 0.9 mg/dL (ref 0.44–1.00)
GFR, Estimated: >60 mL/min (ref >60)
Glucose, Bld: 160 mg/dL — ABNORMAL HIGH (ref 70–99)
Potassium: 2.8 mmol/L — ABNORMAL LOW (ref 3.5–5.1)
Sodium: 135 mmol/L (ref 135–145)

## 2021-08-05 LAB — URINALYSIS, ROUTINE W REFLEX MICROSCOPIC
Bacteria, UA: NONE SEEN
Bilirubin Urine: NEGATIVE
Glucose, UA: 50 mg/dL — AB
Ketones, ur: 5 mg/dL — AB
Leukocytes,Ua: NEGATIVE
Nitrite: NEGATIVE
Protein, ur: 30 mg/dL — AB
Specific Gravity, Urine: 1.021 (ref 1.005–1.030)
pH: 7 (ref 5.0–8.0)

## 2021-08-05 LAB — CBC
HCT: 45.4 % (ref 36.0–46.0)
HCT: 45.8 % (ref 36.0–46.0)
Hemoglobin: 15.5 g/dL — ABNORMAL HIGH (ref 12.0–15.0)
Hemoglobin: 15.7 g/dL — ABNORMAL HIGH (ref 12.0–15.0)
MCH: 29.7 pg (ref 26.0–34.0)
MCH: 30.2 pg (ref 26.0–34.0)
MCHC: 33.8 g/dL (ref 30.0–36.0)
MCHC: 34.6 g/dL (ref 30.0–36.0)
MCV: 86 fL (ref 80.0–100.0)
MCV: 89.1 fL (ref 80.0–100.0)
Platelets: 546 10*3/uL — ABNORMAL HIGH (ref 150–400)
Platelets: 564 10*3/uL — ABNORMAL HIGH (ref 150–400)
RBC: 5.14 MIL/uL — ABNORMAL HIGH (ref 3.87–5.11)
RBC: 5.28 MIL/uL — ABNORMAL HIGH (ref 3.87–5.11)
RDW: 13.9 % (ref 11.5–15.5)
RDW: 14.2 % (ref 11.5–15.5)
WBC: 15.6 10*3/uL — ABNORMAL HIGH (ref 4.0–10.5)
WBC: 17.2 10*3/uL — ABNORMAL HIGH (ref 4.0–10.5)
nRBC: 0 % (ref 0.0–0.2)
nRBC: 0 % (ref 0.0–0.2)

## 2021-08-05 LAB — COMPREHENSIVE METABOLIC PANEL
ALT: 24 U/L (ref 0–44)
AST: 43 U/L — ABNORMAL HIGH (ref 15–41)
Albumin: 3.9 g/dL (ref 3.5–5.0)
Alkaline Phosphatase: 91 U/L (ref 38–126)
Anion gap: 16 — ABNORMAL HIGH (ref 5–15)
BUN: 25 mg/dL — ABNORMAL HIGH (ref 8–23)
CO2: 19 mmol/L — ABNORMAL LOW (ref 22–32)
Calcium: 9.2 mg/dL (ref 8.9–10.3)
Chloride: 96 mmol/L — ABNORMAL LOW (ref 98–111)
Creatinine, Ser: 1.06 mg/dL — ABNORMAL HIGH (ref 0.44–1.00)
GFR, Estimated: 58 mL/min — ABNORMAL LOW (ref >60)
Glucose, Bld: 181 mg/dL — ABNORMAL HIGH (ref 70–99)
Potassium: 3.5 mmol/L (ref 3.5–5.1)
Sodium: 131 mmol/L — ABNORMAL LOW (ref 135–145)
Total Bilirubin: 1 mg/dL (ref 0.3–1.2)
Total Protein: 8.4 g/dL — ABNORMAL HIGH (ref 6.5–8.1)

## 2021-08-05 LAB — PROTIME-INR
INR: 1 (ref 0.8–1.2)
Prothrombin Time: 12.9 seconds (ref 11.4–15.2)

## 2021-08-05 LAB — TSH: TSH: 0.962 u[IU]/mL (ref 0.350–4.500)

## 2021-08-05 LAB — LIPASE, BLOOD: Lipase: 31 U/L (ref 11–51)

## 2021-08-05 LAB — TROPONIN I (HIGH SENSITIVITY)
Troponin I (High Sensitivity): 338 ng/L (ref <18)
Troponin I (High Sensitivity): 410 ng/L (ref <18)

## 2021-08-05 LAB — HEPARIN LEVEL (UNFRACTIONATED)
Heparin Unfractionated: 0.1 IU/mL — ABNORMAL LOW (ref 0.30–0.70)
Heparin Unfractionated: 0.23 IU/mL — ABNORMAL LOW (ref 0.30–0.70)

## 2021-08-05 LAB — APTT: aPTT: 26 seconds (ref 24–36)

## 2021-08-05 LAB — LACTIC ACID, PLASMA
Lactic Acid, Venous: 2.8 mmol/L (ref 0.5–1.9)
Lactic Acid, Venous: 0.9 mmol/L (ref 0.5–1.9)

## 2021-08-05 LAB — HIV ANTIBODY (ROUTINE TESTING W REFLEX): HIV Screen 4th Generation wRfx: NONREACTIVE

## 2021-08-05 MED ORDER — ATORVASTATIN CALCIUM 20 MG PO TABS
40.0000 mg | ORAL_TABLET | Freq: Every day | ORAL | Status: DC
  Administered 2021-08-05 – 2021-08-07 (×2): 40 mg via ORAL
  Filled 2021-08-05 (×3): qty 2

## 2021-08-05 MED ORDER — ASPIRIN EC 81 MG PO TBEC
81.0000 mg | DELAYED_RELEASE_TABLET | Freq: Every day | ORAL | Status: DC
  Administered 2021-08-07: 81 mg via ORAL
  Filled 2021-08-05 (×2): qty 1

## 2021-08-05 MED ORDER — NITROGLYCERIN 0.4 MG SL SUBL: 0.4000 mg | SUBLINGUAL_TABLET | SUBLINGUAL | Status: DC | PRN

## 2021-08-05 MED ORDER — POTASSIUM CHLORIDE 20 MEQ PO PACK
40.0000 meq | PACK | Freq: Once | ORAL | Status: AC
Start: 2021-08-05 — End: 2021-08-05
  Administered 2021-08-05: 40 meq via ORAL
  Filled 2021-08-05: qty 2

## 2021-08-05 MED ORDER — HEPARIN BOLUS VIA INFUSION
1500.0000 [IU] | Freq: Once | INTRAVENOUS | Status: AC
  Administered 2021-08-05: 1500 [IU] via INTRAVENOUS
  Filled 2021-08-05: qty 1500

## 2021-08-05 MED ORDER — POTASSIUM CHLORIDE 20 MEQ PO PACK
40.0000 meq | PACK | Freq: Once | ORAL | Status: AC
  Administered 2021-08-05: 40 meq via ORAL
  Filled 2021-08-05: qty 2

## 2021-08-05 MED ORDER — LACTATED RINGERS IV BOLUS
1000.0000 mL | Freq: Once | INTRAVENOUS | Status: AC
  Administered 2021-08-05: 1000 mL via INTRAVENOUS

## 2021-08-05 MED ORDER — ALUM & MAG HYDROXIDE-SIMETH 200-200-20 MG/5ML PO SUSP
30.0000 mL | ORAL | Status: DC | PRN
  Administered 2021-08-05: 30 mL via ORAL
  Filled 2021-08-05: qty 30

## 2021-08-05 MED ORDER — ONDANSETRON HCL 4 MG/2ML IJ SOLN
4.0000 mg | INTRAMUSCULAR | Status: AC
  Administered 2021-08-05: 4 mg via INTRAVENOUS
  Filled 2021-08-05: qty 2

## 2021-08-05 MED ORDER — HEPARIN BOLUS VIA INFUSION
800.0000 [IU] | Freq: Once | INTRAVENOUS | Status: AC
  Administered 2021-08-05: 800 [IU] via INTRAVENOUS
  Filled 2021-08-05: qty 800

## 2021-08-05 MED ORDER — ACETAMINOPHEN 325 MG PO TABS: 650.0000 mg | ORAL_TABLET | ORAL | Status: DC | PRN

## 2021-08-05 MED ORDER — LACTATED RINGERS IV BOLUS (SEPSIS)
1000.0000 mL | Freq: Once | INTRAVENOUS | Status: AC
  Administered 2021-08-05: 1000 mL via INTRAVENOUS

## 2021-08-05 MED ORDER — HEPARIN (PORCINE) 25000 UT/250ML-% IV SOLN
1000.0000 [IU]/h | INTRAVENOUS | Status: DC
  Administered 2021-08-05: 600 [IU]/h via INTRAVENOUS
  Administered 2021-08-06: 900 [IU]/h via INTRAVENOUS
  Filled 2021-08-05 (×3): qty 250

## 2021-08-05 MED ORDER — HYDRALAZINE HCL 20 MG/ML IJ SOLN
10.0000 mg | Freq: Three times a day (TID) | INTRAMUSCULAR | Status: DC | PRN
  Administered 2021-08-05 (×2): 10 mg via INTRAVENOUS
  Filled 2021-08-05 (×2): qty 1

## 2021-08-05 MED ORDER — ALPRAZOLAM 0.5 MG PO TABS
1.0000 mg | ORAL_TABLET | Freq: Two times a day (BID) | ORAL | Status: DC | PRN
  Administered 2021-08-06: 1 mg via ORAL
  Filled 2021-08-05: qty 2

## 2021-08-05 MED ORDER — ONDANSETRON HCL 4 MG/2ML IJ SOLN
4.0000 mg | Freq: Four times a day (QID) | INTRAMUSCULAR | Status: DC | PRN
  Administered 2021-08-05 – 2021-08-06 (×2): 4 mg via INTRAVENOUS
  Filled 2021-08-05 (×2): qty 2

## 2021-08-05 MED ORDER — SODIUM BICARBONATE 8.4 % IV SOLN
INTRAVENOUS | Status: DC
  Filled 2021-08-05: qty 75

## 2021-08-05 MED ORDER — HEPARIN BOLUS VIA INFUSION
3000.0000 [IU] | Freq: Once | INTRAVENOUS | Status: AC
  Administered 2021-08-05: 3000 [IU] via INTRAVENOUS
  Filled 2021-08-05: qty 3000

## 2021-08-05 MED ORDER — IOHEXOL 300 MG/ML  SOLN
75.0000 mL | Freq: Once | INTRAMUSCULAR | Status: AC | PRN
  Administered 2021-08-05: 75 mL via INTRAVENOUS

## 2021-08-05 MED ORDER — ALPRAZOLAM 0.5 MG PO TABS
0.5000 mg | ORAL_TABLET | Freq: Two times a day (BID) | ORAL | Status: DC | PRN
  Administered 2021-08-05 (×2): 0.5 mg via ORAL
  Filled 2021-08-05 (×2): qty 1

## 2021-08-05 MED ORDER — FAMOTIDINE 20 MG PO TABS
20.0000 mg | ORAL_TABLET | Freq: Every day | ORAL | Status: DC
  Administered 2021-08-05 – 2021-08-07 (×2): 20 mg via ORAL
  Filled 2021-08-05 (×3): qty 1

## 2021-08-05 MED ORDER — METOPROLOL TARTRATE 5 MG/5ML IV SOLN
5.0000 mg | Freq: Four times a day (QID) | INTRAVENOUS | Status: AC | PRN
  Administered 2021-08-05 (×2): 5 mg via INTRAVENOUS
  Filled 2021-08-05 (×2): qty 5

## 2021-08-05 MED ORDER — SODIUM BICARBONATE 8.4 % IV SOLN
INTRAVENOUS | Status: DC
  Filled 2021-08-05 (×5): qty 75

## 2021-08-05 NOTE — H&P (Signed)
?History and Physical  ? ? ?Patient: Veronica Warner [id number] DOB: 02-29-1956 ?DOA: 08/05/2021 ?DOS: the patient was seen and examined on 08/05/2021 ?PCP: Pcp, No  ?Patient coming from: Home ? ?Chief Complaint:  ?Chief Complaint  ?Patient presents with  ? Abdominal Pain  ? Weakness  ? ? ?HPI: Veronica Warner is a 66 y.o. female with medical history significant for HTN, remote alcohol use, Takotsubo/stress cardiomyopathy in 2021, who presents to the ED with a 3-day history of generalized abdominal pain, nausea vomiting weakness as well as palpitations.  She denied chest pain or shortness of breath but was reportedly cold and clammy and diaphoretic earlier worse with called.  Had no fever or chills or dysuria.  EMS reported seeing rhythm like A-fib. ?ED course and data review: On arrival tachycardic to 120 with respirations 28 and BP as high as 202/102.  Labs with WBC 15,000 with lactic acid 2.8.  Hemoglobin 15.7.  Sodium 131, BUN/creatinine 25/1.06 with normal potassium of 3.5 and bicarb 19 with anion gap of 16.  AST/ALT of 43/24.  Lipase 31.  INR 1.0.  Urinalysis unremarkable.  Troponin 410> 338.  EKG, personally viewed and interpreted with sinus tachycardia at 132 with nonspecific ST-T wave changes.  Chest x-ray with no acute findings.  CT abdomen and pelvis showing extensive colonic diverticulosis without diverticulitis. ?Patient was treated with a 2 L LR bolus, started on a heparin infusion and hospitalist consulted for admission.  ? ?Review of Systems: As mentioned in the history of present illness. All other systems reviewed and are negative. ?Past Medical History:  ?Diagnosis Date  ? Coronary artery disease   ? Hypertension   ? Pancreatitis   ? ?Past Surgical History:  ?Procedure Laterality Date  ? CHOLECYSTECTOMY    ? ?Social History:  reports that she does not currently use alcohol. She reports current drug use. No history on file for tobacco use. ? ?No Known Allergies ? ?No family history on  file. ? ?Prior to Admission medications   ?Medication Sig Start Date End Date Taking? Authorizing Provider  ?metoprolol succinate (TOPROL-XL) 25 MG 24 hr tablet Take 25 mg by mouth daily. 07/27/20  Yes [provider]  ?venlafaxine XR (EFFEXOR-XR) 75 MG 24 hr capsule Take 1 capsule by mouth daily. 12/04/20  Yes [provider]  ? ? ?Physical Exam: ?Vitals:  ? 08/04/21 2339 08/04/21 2340 08/05/21 0115 08/05/21 0225  ?BP:   (!) 202/102 131/69  ?Pulse:   97 84  ?Resp: (!) 28  19 11   ?Temp:      ?TempSrc:      ?SpO2:   100% 96%  ?Weight:  50.8 kg    ?Height:  5\' 6"  (1.676 m)    ? ?Physical Exam ?Vitals and nursing note reviewed.  ?Constitutional:   ?   General: She is not in acute distress. ?HENT:  ?   Head: Normocephalic and atraumatic.  ?Cardiovascular:  ?   Rate and Rhythm: Normal rate and regular rhythm.  ?   Heart sounds: Normal heart sounds.  ?Pulmonary:  ?   Effort: Pulmonary effort is normal.  ?   Breath sounds: Normal breath sounds.  ?Abdominal:  ?   Palpations: Abdomen is soft.  ?   Tenderness: There is no abdominal tenderness.  ?Neurological:  ?   Mental Status: Mental status is at baseline.  ? ? ? ?Data Reviewed: ?Relevant notes from primary care and specialist visits, past discharge summaries as available in EHR, including Care Everywhere. ?Prior  diagnostic testing as pertinent to current admission diagnoses ?Updated medications and problem lists for reconciliation ?ED course, including vitals, labs, imaging, treatment and response to treatment ?Triage notes, nursing and pharmacy notes and ED provider's notes ?Notable results as noted in HPI ? ? ?Assessment and Plan: ?* Tachyarrhythmia ?Patient presenting with palpitations, nausea vomiting and diarrhea, palpitations, tachycardia and AKI with elevated WBC and lactic acid and troponin ?Some suspicion for sepsis given from acute gastroenteritis ?We will get TSH to evaluate for hypothyroidism ?Treat gastroenteritis ?Metoprolol as needed for  tachyarrhythmia ?Continuous cardiac monitoring ? ? ?Hypertensive urgency ?SBP over 200 ?As needed IV metoprolol ? ?Acute gastroenteritis ?IV hydration, IV antiemetics and supportive care ?Clear liquid diet ?Follow-up GI panel ? ?Elevated troponin ?Suspect demand ischemia from tachyarrhythmia ?Non-ACS trend for troponin of 410-338 ?We will continue heparin infusion started from the ED pending cardiology evaluation ? ?Metabolic acidosis, increased anion gap (IAG) ?Secondary to AKI and lactic acidosis ?IV hydration with sodium bicarb and serial labs ? ?Lactic acidosis ?Secondary to acute gastroenteritis.  Possible sepsis so we will continue to monitor ? ?AKI (acute kidney injury) ([place]) ?Secondary to acute gastroenteritis ?IV hydration, avoid nephrotoxins and renally dose all meds ? ?Palpitations ?See management under tachyarrhythmia ? ? ? ? ? ? ?Advance Care Planning:   Code Status: Not on file  ? ?Consults: Cardiology, Dr. Quentin Ore [facility] ? ?Family Communication: none ? ?Severity of Illness: ?The appropriate patient status for this patient is INPATIENT. Inpatient status is judged to be reasonable and necessary in order to provide the required intensity of service to ensure the patient's safety. The patient's presenting symptoms, physical exam findings, and initial radiographic and laboratory data in the context of their chronic comorbidities is felt to place them at high risk for further clinical deterioration. Furthermore, it is not anticipated that the patient will be medically stable for discharge from the hospital within 2 midnights of admission.  ? ?* I certify that at the point of admission it is my clinical judgment that the patient will require inpatient hospital care spanning beyond 2 midnights from the point of admission due to high intensity of service, high risk for further deterioration and high frequency of surveillance required.* ? ?Author: ?Athena Masse, MD ?08/05/2021 3:13 AM ? ?For on call review  [email or web address].  ?

## 2021-08-05 NOTE — Assessment & Plan Note (Addendum)
--  Suspect demand ischemia from tachyarrhythmias, hypertensive urgency.  Troponin 410 trended down to 338. ?--Patient started on heparin drip--now d/cec.  Cardiology consulted. ?--no further w/u per cardiology ?--Can consider outpatient stress test or CT chest. ?--  Continue aspirin 81 mg. ?-echo--Left ventricular ejection fraction, by estimation, is >75%. The left  ?ventricle has hyperdynamic function. The left ventricle has no regional  ?wall motion abnormalities. There is mild left ventricular hypertrophy.  ?Indeterminate diastolic filling due to  ??E-A fusion.  ??2. Right ventricular systolic function is normal. The right ventricular  ?size is normal.  ?

## 2021-08-05 NOTE — Progress Notes (Signed)
Pt moaning and restless. States it's her acid reflux. Asked her if she's had this feeling before and she states yes she's had it since Friday. Asked her if it's different or worse and she states no. Dr. Damita Dunnings notified via chat. ? ?

## 2021-08-05 NOTE — ED Notes (Signed)
Informed RN bed assigned 

## 2021-08-05 NOTE — Assessment & Plan Note (Addendum)
Secondary to acute gastroenteritis.   ?Lactic acidosis resolved.  Sepsis ruled out. ? ?

## 2021-08-05 NOTE — Consult Note (Signed)
ANTICOAGULATION CONSULT NOTE  ? ?Pharmacy Consult for Heparin ?Indication: chest pain/ACS ? ?Allergies  ?Allergen Reactions  ? Ondansetron Hcl   ?  Other reaction(s): Other (comments) ?Does not respond well  ? Short Ragweed Pollen Ext   ?  Other reaction(s): Sneezing  ? ? ?Patient Measurements: ?Height: 5\' 6"  (167.6 cm) ?Weight: 50.8 kg (112 lb) ?IBW/kg (Calculated) : 59.3 ?Heparin Dosing Weight: 50.8 kg ? ?Vital Signs: ?Temp: 98.5 ?F (36.9 ?C) (05/15 0730) ?Temp Source: Oral (05/15 0730) ?BP: 175/108 (05/15 0930) ?Pulse Rate: 97 (05/15 0930) ? ?Labs: ?Recent Labs  ?  08/04/21 ?2342 08/05/21 ?0114 08/05/21 ?0115 08/05/21 ?08/07/21  ?HGB 15.7*  --   --  15.5*  ?HCT 45.4  --   --  45.8  ?PLT 564*  --   --  546*  ?APTT  --  26  --   --   ?LABPROT  --  12.9  --   --   ?INR  --  1.0  --   --   ?HEPARINUNFRC  --   --   --  0.10*  ?CREATININE 1.06*  --   --   --   ?TROPONINIHS 410*  --  338*  --   ? ? ? ?Estimated Creatinine Clearance: 42.4 mL/min (A) (by C-G formula based on SCr of 1.06 mg/dL (H)). ? ? ?Medical History: ?Past Medical History:  ?Diagnosis Date  ? Coronary artery disease   ? Hypertension   ? Pancreatitis   ? ? ?Medications:  ?(Not in a hospital admission) ?Scheduled:  ? [START ON 08/06/2021] aspirin EC  81 mg Oral Daily  ? atorvastatin  40 mg Oral Daily  ? ?Infusions:  ? heparin 600 Units/hr (08/05/21 0249)  ? sodium bicarbonate in 0.45 NS mL infusion 125 mL/hr at 08/05/21 0852  ? ?PRN:  ?Anti-infectives (From admission, onward)  ? ? None  ? ?  ? ? ?Assessment: ?Pharmacy consulted to start heparin for NSTEMI. CBC stable and trop 410. No medications PTA listed, no DOAC PTA. Baseline labs ordered.  ?   ?Goal of Therapy:  ?Heparin level 0.3-0.7 units/ml ?Monitor platelets by anticoagulation protocol: Yes ? ?Results: ?5/15@0728  HL 0.10 Subtherapeutic at 600un/hr ?  ?Plan:  ?Give 1500 units bolus x 1 ?Increase heparin infusion at 800 units/hr ?Check anti-Xa level in 6 hours and daily while on heparin ?Continue to  monitor H&H and platelets ? ?Fouad Taul Rodriguez-Guzman PharmD, BCPS ?08/05/2021 10:22 AM ? ? ?

## 2021-08-05 NOTE — ED Provider Notes (Signed)
? ?[facility] ?Provider Note ? ? ? Event Date/Time  ? First MD Initiated Contact with Patient 08/05/21 0051   ?  (approximate) ? ? ?History  ? ?Abdominal Pain and Weakness ? ? ?HPI ?Level 5 caveat:  history/ROS limited by acute/critical illness ? ?Veronica Warner is a 66 y.o. female with a medical history listed in the computer of coronary artery disease (but which the patient denies) as well as a history of hypertension.   ? ?She presents by EMS for evaluation of 3 days of symptoms that began with nausea, vomiting, and diarrhea and have worsened to generalized abdominal pain and generalized weakness.  She also notes that she has felt severe heart palpitations and notes that her blood pressure is extremely elevated.  She has intermittently been clammy and sweaty.  She appears to be in distress and states that she has not been able to eat or drink very much and feels dehydrated.  She is not having chest pain or shortness of breath.  No dysuria. ?  ? ? ?Physical Exam  ? ?Triage Vital Signs: ?ED Triage Vitals  ?Enc Vitals Group  ?   BP 08/04/21 2331 (!) 184/99  ?   Pulse Rate 08/04/21 2331 (!) 120  ?   Resp 08/04/21 2339 (!) 28  ?   Temp 08/04/21 2331 97.9 ?F (36.6 ?C)  ?   Temp Source 08/04/21 2331 Oral  ?   SpO2 08/04/21 2331 97 %  ?   Weight 08/04/21 2340 50.8 kg (112 lb)  ?   Height 08/04/21 2340 1.676 m (5\' 6" )  ?   Head Circumference --   ?   Peak Flow --   ?   Pain Score 08/04/21 2340 0  ?   Pain Loc --   ?   Pain Edu? --   ?   Excl. in GC? --   ? ? ?Most recent vital signs: ?Vitals:  ? 08/05/21 0900 08/05/21 0930  ?BP: (!) 202/96 (!) 175/108  ?Pulse: 88 97  ?Resp: 18 16  ?Temp:    ?SpO2: 95% 100%  ? ? ? ?General: Awake, moderate nonspecific distress. ?CV:  Good peripheral perfusion but diaphoretic.  Tachycardia, regular rhythm. ?Resp:  Normal effort.  No accessory muscle usage, but with tachypnea. ?Abd:  No distention.  Mild generalized tenderness to palpation throughout with no  localized peritonitis. ?Other:  Patient is quite anxious and finds it difficult to remain still.  Moving all 4 extremities, no focal neurological deficits. ? ? ?ED Results / Procedures / Treatments  ? ?Labs ?(all labs ordered are listed, but only abnormal results are displayed) ?Labs Reviewed  ?COMPREHENSIVE METABOLIC PANEL - Abnormal; Notable for the following components:  ?    Result Value  ? Sodium 131 (*)   ? Chloride 96 (*)   ? CO2 19 (*)   ? Glucose, Bld 181 (*)   ? BUN 25 (*)   ? Creatinine, Ser 1.06 (*)   ? Total Protein 8.4 (*)   ? AST 43 (*)   ? GFR, Estimated 58 (*)   ? Anion gap 16 (*)   ? All other components within normal limits  ?CBC - Abnormal; Notable for the following components:  ? WBC 15.6 (*)   ? RBC 5.28 (*)   ? Hemoglobin 15.7 (*)   ? Platelets 564 (*)   ? All other components within normal limits  ?URINALYSIS, ROUTINE W REFLEX MICROSCOPIC - Abnormal; Notable for the following components:  ?  Color, Urine YELLOW (*)   ? APPearance CLEAR (*)   ? Glucose, UA 50 (*)   ? Hgb urine dipstick SMALL (*)   ? Ketones, ur 5 (*)   ? Protein, ur 30 (*)   ? All other components within normal limits  ?LACTIC ACID, PLASMA - Abnormal; Notable for the following components:  ? Lactic Acid, Venous 2.8 (*)   ? All other components within normal limits  ?CBC - Abnormal; Notable for the following components:  ? WBC 17.2 (*)   ? RBC 5.14 (*)   ? Hemoglobin 15.5 (*)   ? Platelets 546 (*)   ? All other components within normal limits  ?TROPONIN I (HIGH SENSITIVITY) - Abnormal; Notable for the following components:  ? Troponin I (High Sensitivity) 410 (*)   ? All other components within normal limits  ?TROPONIN I (HIGH SENSITIVITY) - Abnormal; Notable for the following components:  ? Troponin I (High Sensitivity) 338 (*)   ? All other components within normal limits  ?CULTURE, BLOOD (ROUTINE X 2)  ?CULTURE, BLOOD (ROUTINE X 2)  ?URINE CULTURE  ?GASTROINTESTINAL PANEL BY PCR, STOOL (REPLACES STOOL CULTURE)  ?LIPASE, BLOOD   ?LACTIC ACID, PLASMA  ?PROTIME-INR  ?APTT  ?TSH  ?HEPARIN LEVEL (UNFRACTIONATED)  ?HIV ANTIBODY (ROUTINE TESTING W REFLEX)  ?BASIC METABOLIC PANEL  ?LIPOPROTEIN A (LPA)  ? ? ? ?EKG ? ?ED ECG REPORT #1 ?I, Loleta Roseory Kasidy Gianino, the attending physician, personally viewed and interpreted this ECG. ? ?Date: 08/04/2021 ?EKG Time: 23: 45 ?Rate: 122 ?Rhythm: Sinus tachycardia ?QRS Axis: normal ?Intervals: normal ?ST/T Wave abnormalities: Non-specific ST segment / T-wave changes, but no clear evidence of acute ischemia. ?Narrative Interpretation: no definitive evidence of acute ischemia; does not meet STEMI criteria. ? ?ED ECG REPORT #2 ?I, Loleta Roseory Averyana Pillars, the attending physician, personally viewed and interpreted this ECG. ? ?Date: 08/05/2021 ?EKG Time: 01:12 ?Rate: 97 ?Rhythm: normal sinus rhythm ?QRS Axis: normal ?Intervals: normal ?ST/T Wave abnormalities: Non-specific ST segment / T-wave changes, but no clear evidence of acute ischemia. ?Narrative Interpretation: no definitive evidence of acute ischemia; does not meet STEMI criteria. ? ? ? ?RADIOLOGY ?I personally viewed and interpreted the patient's chest x-ray and CT abdomen/pelvis.  There are no acute findings or evidence of infection on her chest x-rays, and her CT abdomen and pelvis also did not show acute findings. ? ? ? ?PROCEDURES: ? ?Critical Care performed: No ? ?.1-3 Lead EKG Interpretation ?Performed by: Loleta RoseForbach, Laverne Klugh, MD ?Authorized by: Loleta RoseForbach, Franki Alcaide, MD  ? ?  Interpretation: abnormal   ?  ECG rate:  115 ?  ECG rate assessment: tachycardic   ?  Rhythm: sinus tachycardia   ?  Ectopy: none   ?  Conduction: normal   ?.Critical Care ?Performed by: Loleta RoseForbach, Thia Olesen, MD ?Authorized by: Loleta RoseForbach, Jerin Franzel, MD  ? ?Critical care provider statement:  ?  Critical care time (minutes):  60 ?  Critical care time was exclusive of:  Separately billable procedures and treating other patients ?  Critical care was necessary to treat or prevent imminent or life-threatening deterioration of  the following conditions:  Circulatory failure and cardiac failure ?  Critical care was time spent personally by me on the following activities:  Development of treatment plan with patient or surrogate, evaluation of patient's response to treatment, examination of patient, obtaining history from patient or surrogate, ordering and performing treatments and interventions, ordering and review of laboratory studies, ordering and review of radiographic studies, pulse oximetry, re-evaluation of patient's condition and  review of old charts ? ? ?MEDICATIONS ORDERED IN ED: ?Medications  ?heparin ADULT infusion 100 units/mL (25000 units/226mL) (600 Units/hr Intravenous New Bag/Given 08/05/21 0249)  ?metoprolol tartrate (LOPRESSOR) injection 5 mg (5 mg Intravenous Given 08/05/21 0733)  ?aspirin EC tablet 81 mg (has no administration in time range)  ?nitroGLYCERIN (NITROSTAT) SL tablet 0.4 mg (has no administration in time range)  ?acetaminophen (TYLENOL) tablet 650 mg (has no administration in time range)  ?ondansetron [facility]) injection 4 mg (4 mg Intravenous Given 08/05/21 0808)  ?atorvastatin (LIPITOR) tablet 40 mg (has no administration in time range)  ?sodium bicarbonate 75 mEq in sodium chloride 0.45 % 1,075 mL infusion ( Intravenous New Bag/Given 08/05/21 0852)  ?hydrALAZINE (APRESOLINE) injection 10 mg (10 mg Intravenous Given 08/05/21 0804)  ?ALPRAZolam Prudy Feeler) tablet 0.5 mg (0.5 mg Oral Given 08/05/21 0804)  ?lactated ringers bolus 1,000 mL (0 mLs Intravenous Stopped 08/05/21 0244)  ?ondansetron [facility]) injection 4 mg (4 mg Intravenous Given 08/05/21 0241)  ?heparin bolus via infusion 3,000 Units (3,000 Units Intravenous Bolus from Bag 08/05/21 0250)  ?iohexol (OMNIPAQUE) 300 MG/ML solution 75 mL (75 mLs Intravenous Contrast Given 08/05/21 0128)  ?lactated ringers bolus 1,000 mL (0 mLs Intravenous Stopped 08/05/21 0537)  ? ? ? ?IMPRESSION / MDM / ASSESSMENT AND PLAN / ED COURSE  ?I reviewed the triage vital signs and the  nursing notes. ?             ?               ? ?Differential diagnosis includes, but is not limited to, NSTEMI, demand ischemia due to systemic infection, diverticulitis, UTI/pyelonephritis, mesenteric ischemia, AA

## 2021-08-05 NOTE — Care Plan (Signed)
This 66 years old female with PMH significant for hypertension,  remote alcohol use, Takotsubo / stress cardiomyopathy in 2021, who presents in the ED with 3-day history of generalized abdominal pain associated with nausea, vomiting and palpitations.  Patient denies any chest pain or shortness of breath.  EMS reported regular rhythm like A-fib.  On arrival patient was tachycardic with heart rate in 120s,  respiratory rate 28,  blood pressure 2 02/102.  Lactic acid 2.8,  troponin 410> 338.  CT abdomen and pelvis showing extensive colonic diverticulosis without diverticulitis.  Patient is admitted for further evaluation.  Cardiology is consulted, obtain echocardiogram.  Patient was seen and examined , reports more anxiety than chest pain. ?

## 2021-08-05 NOTE — Assessment & Plan Note (Addendum)
Secondary to AKI and lactic acidosis ?IV hydration with sodium bicarb and serial labs ?

## 2021-08-05 NOTE — Consult Note (Signed)
ANTICOAGULATION CONSULT NOTE  ? ?Pharmacy Consult for Heparin ?Indication: chest pain/ACS ? ?Allergies  ?Allergen Reactions  ? Ondansetron Hcl   ?  Other reaction(s): Other (comments) ?Does not respond well  ? Short Ragweed Pollen Ext   ?  Other reaction(s): Sneezing  ? ? ?Patient Measurements: ?Height: 5\' 6"  (167.6 cm) ?Weight: 50.8 kg (112 lb) ?IBW/kg (Calculated) : 59.3 ?Heparin Dosing Weight: 50.8 kg ? ?Vital Signs: ?Temp: 99.1 ?F (37.3 ?C) (05/15 1720) ?Temp Source: Oral (05/15 0730) ?BP: 147/72 (05/15 1720) ?Pulse Rate: 118 (05/15 1720) ? ?Labs: ?Recent Labs  ?  08/04/21 ?2342 08/05/21 ?0114 08/05/21 ?0115 08/05/21 ?08/07/21 08/05/21 ?1449 08/05/21 ?1559  ?HGB 15.7*  --   --  15.5*  --   --   ?HCT 45.4  --   --  45.8  --   --   ?PLT 564*  --   --  546*  --   --   ?APTT  --  26  --   --   --   --   ?LABPROT  --  12.9  --   --   --   --   ?INR  --  1.0  --   --   --   --   ?HEPARINUNFRC  --   --   --  0.10*  --  0.23*  ?CREATININE 1.06*  --   --   --  0.90  --   ?TROPONINIHS 410*  --  338*  --   --   --   ? ? ? ?Estimated Creatinine Clearance: 50 mL/min (by C-G formula based on SCr of 0.9 mg/dL). ? ? ?Medical History: ?Past Medical History:  ?Diagnosis Date  ? Coronary artery disease   ? Hypertension   ? Pancreatitis   ? ? ?Medications:  ?Medications Prior to Admission  ?Medication Sig Dispense Refill Last Dose  ? albuterol (VENTOLIN HFA) 108 (90 Base) MCG/ACT inhaler Inhale 2 puffs into the lungs every 6 (six) hours as needed for wheezing or shortness of breath.   prn at prn  ? ascorbic acid (VITAMIN C) 500 MG tablet Take 4,000 mg by mouth in the morning.   08/02/2021 at 1000  ? aspirin EC 81 MG tablet Take 81 mg by mouth daily. Swallow whole.   08/02/2021 at 1000  ? Calcium-Magnesium 600-300 MG/6.8GM POWD Take 1 packet by mouth daily.   08/02/2021 at 1000  ? cloNIDine (CATAPRES) 0.1 MG tablet Take 0.1 mg by mouth 2 (two) times daily.   08/04/2021 at 1400  ? colestipol (COLESTID) 1 g tablet Take 2 g by mouth daily as  needed.   prn at prn  ? famotidine (PEPCID) 20 MG tablet Take 20 mg by mouth daily.   08/02/2021 at 1000  ? metoprolol succinate (TOPROL-XL) 25 MG 24 hr tablet Take by mouth in the morning and at bedtime.   08/04/2021 at 1400  ? montelukast (SINGULAIR) 10 MG tablet Take 10 mg by mouth daily.   08/01/2021 at 2359  ? Multiple Vitamin (DAILY VITES) tablet Take 1 tablet by mouth daily.   08/02/2021 at 1000  ? venlafaxine XR (EFFEXOR-XR) 75 MG 24 hr capsule Take 1 capsule by mouth daily.   08/02/2021 at 1000  ? ?Scheduled:  ? [START ON 08/06/2021] aspirin EC  81 mg Oral Daily  ? atorvastatin  40 mg Oral Daily  ? famotidine  20 mg Oral Daily  ? heparin  800 Units Intravenous Once  ? potassium chloride  40 mEq  Oral Once  ? ?Infusions:  ? heparin 900 Units/hr (08/05/21 1738)  ? sodium bicarbonate in 0.45 NS mL infusion Stopped (08/05/21 1740)  ? ?PRN:  ?Anti-infectives (From admission, onward)  ? ? None  ? ?  ? ? ?Assessment: ?Pharmacy consulted to start heparin for NSTEMI. CBC stable and trop 410. No medications PTA listed, no DOAC PTA. Baseline labs ordered.  ?   ?Goal of Therapy:  ?Heparin level 0.3-0.7 units/ml ?Monitor platelets by anticoagulation protocol: Yes ? ?Results: ?5/15@0728  HL 0.10 Subtherapeutic ?5/15@1559  HL 0.23 subtherapeutic ?  ?Plan:  ?Give 800 units bolus x 1 ?Increase heparin infusion at 900 units/hr ?Check anti-Xa level in 6 hours and daily while on heparin ?Continue to monitor H&H and platelets ? ?Bettey Costa, PharmD ?Clinical Pharmacist ?08/05/2021 ?5:53 PM ? ? ? ?

## 2021-08-05 NOTE — Assessment & Plan Note (Addendum)
--  recieved IV hydration, IV antiemetics and supportive care ?Clear liquid diet--to soft diet ?Follow-up GI panel--unable to give stool ?

## 2021-08-05 NOTE — Assessment & Plan Note (Addendum)
Continue metoprolol. 

## 2021-08-05 NOTE — Assessment & Plan Note (Addendum)
Likely secondary to acute gastroenteritis.  ?improved ?

## 2021-08-05 NOTE — Assessment & Plan Note (Addendum)
--  Patient presented with palpitations, nausea, vomiting and diarrhea. ?--This could be from acute gastroenteritis. ?--TSH normal.  ?--received IV hydration, supportive care. ?--Continue metoprolol as needed for tachyarrhythmia ?--remains in NSR ?-- cardiology okay to discharge with outpatient follow-up ? ?

## 2021-08-05 NOTE — ED Notes (Signed)
Secure chat sent to MD regarding pt very anxious, unable to sit still, up and down to BR frequently, BP 203/110 despite giving PRN antihypertensive as ordered. MD to assess. See now orders.  ?

## 2021-08-05 NOTE — ED Notes (Signed)
Patient taken to CT.

## 2021-08-05 NOTE — Consult Note (Signed)
?Cardiology Consultation:  ? ?Patient ID: [name] ?MRN: [id number]; DOB: 1955-10-15 ? ?Admit date: 08/05/2021 ?Date of Consult: 08/05/2021 ? ?PCP:  Pcp, No ?  ?[facility] Providers ?Cardiologist:  Kathlyn Sacramento, MD- New ?Patient Profile:  ? ?Veronica Warner is a 66 y.o. 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 Damita Dunnings. ? ?History of Present Illness:  ? ?Ms. Szymkowiak presented to the emergency department after having severe abdominal pain and weakness that started this past Friday. Associated symptoms were palpitations and rapid heart rate. She denies chest pain, shortness of breath, chest tightness, continues to endorse abdominal pain with chills. Of note EMS reported to the emergency department that she had a possible run a atrial fibrillation during transport. Unfortunately the symptoms that she currently has are the same symptoms that lead to her LHC done in [place] in 2021. ?During her stay in the emergency department initial vital signs on arrival were heart rate tachycardiac rate of 120 bpm, RR 31, blood pressure elevated at 184/99, oxygen sats 97% on room air and temperature of 98.5.  ?Pertinent labs revealed sodium 131, glucose 181, creatinine 1.06, BUN 25, GFR 58, AST 43, HS troponins 410 then down to 338, Lactic acid 2.8 trended down to 0.9, WBC 15.6 trending up to 17.2, Hgb 15.5, PLTS 546, urinalysis unremarkable. ?Chest x-ray with no acute findings. CT of the abdomen and pelvis showing extensive colonic diverticulosis without diverticulitis. ?Medications given were heparin bolus and drip per pharmacy protocol, 2 L LR  bolus, hydralazine 10 mg IVP ? ? ? ? ?Past Medical History:  ?Diagnosis Date  ? Coronary artery disease   ? Hypertension    ? Pancreatitis   ? ? ?Past Surgical History:  ?Procedure Laterality Date  ? CHOLECYSTECTOMY    ?  ? ?Home Medications:  ?Prior to Admission medications   ?Medication Sig Start Date End Date Taking? Authorizing Provider  ?albuterol (VENTOLIN HFA) 108 (90 Base) MCG/ACT inhaler Inhale 2 puffs into the lungs every 6 (six) hours as needed for wheezing or shortness of breath.   Yes [provider]  ?ascorbic acid (VITAMIN C) 500 MG tablet Take 4,000 mg by mouth in the morning.   Yes [provider]  ?aspirin EC 81 MG tablet Take 81 mg by mouth daily. Swallow whole.   Yes [provider]  ?Calcium-Magnesium 600-300 MG/6.8GM POWD Take 1 packet by mouth daily.   Yes [provider]  ?cloNIDine (CATAPRES) 0.1 MG tablet Take 0.1 mg by mouth 2 (two) times daily. 06/03/21  Yes [provider]  ?colestipol (COLESTID) 1 g tablet Take 2 g by mouth daily as needed.   Yes [provider]  ?famotidine (PEPCID) 20 MG tablet Take 20 mg by mouth daily. 02/26/20  Yes [provider]  ?metoprolol succinate (TOPROL-XL) 25 MG 24 hr tablet Take by mouth in the morning and at bedtime. 07/27/20  Yes [provider]  ?montelukast (SINGULAIR) 10 MG tablet Take 10 mg by mouth daily. 11/22/20  Yes [provider]  ?Multiple Vitamin (DAILY VITES) tablet Take 1 tablet by mouth daily.   Yes [provider]  ?venlafaxine XR (EFFEXOR-XR) 75 MG 24 hr capsule Take 1 capsule by mouth daily. 12/04/20  Yes [provider]  ? ? ?Inpatient Medications: ?Scheduled Meds: ? [  START ON 08/06/2021] aspirin EC  81 mg Oral Daily  ? atorvastatin  40 mg Oral Daily  ? ?Continuous Infusions: ? heparin 600 Units/hr (08/05/21 0249)  ? sodium bicarbonate in 0.45 NS mL infusion 125 mL/hr at 08/05/21 0852  ? ?PRN Meds: ?acetaminophen, ALPRAZolam, hydrALAZINE, metoprolol tartrate, nitroGLYCERIN, ondansetron (ZOFRAN) IV ? ?Allergies:    ?Allergies  ?Allergen Reactions  ? Ondansetron Hcl    ?  Other reaction(s): Other (comments) ?Does not respond well  ? Short Ragweed Pollen Ext   ?  Other reaction(s): Sneezing  ? ? ?Social History:   ?Social History  ? ?Socioeconomic History  ? Marital status: Married  ?  Spouse name: Not on file  ? Number of children: Not on file  ? Years of education: Not on file  ? Highest education level: Not on file  ?Occupational History  ? Not on file  ?Tobacco Use  ? Smoking status: Not on file  ? Smokeless tobacco: Not on file  ?Vaping Use  ? Vaping Use: Every day  ?Substance and Sexual Activity  ? Alcohol use: Not Currently  ? Drug use: Yes  ?  Comment: [employer] vaping  ? Sexual activity: Not on file  ?Other Topics Concern  ? Not on file  ?Social History Narrative  ? Not on file  ? ?Social Determinants of Health  ? ?Financial Resource Strain: Not on file  ?Food Insecurity: Not on file  ?Transportation Needs: Not on file  ?Physical Activity: Not on file  ?Stress: Not on file  ?Social Connections: Not on file  ?Intimate Partner Violence: Not on file  ?  ?Family History:   ?Family History  ?Problem Relation Age of Onset  ? Hypertension Mother   ? Cancer Mother   ? Hypothyroidism Mother   ? Thyroid disease Brother   ? Hypertension Brother   ? Factor V Leiden deficiency Other   ?  ? ?ROS:  ?Please see the history of present illness.  ?Review of Systems  ?Constitutional:  Positive for chills. Negative for malaise/fatigue.  ?HENT: Negative.    ?Eyes: Negative.   ?Respiratory: Negative.    ?Cardiovascular:  Positive for palpitations.  ?Gastrointestinal:  Positive for abdominal pain, nausea and vomiting.  ?Genitourinary: Negative.   ?Musculoskeletal: Negative.   ?Skin: Negative.   ?Neurological:  Positive for weakness.  ?Endo/Heme/Allergies: Negative.   ?Psychiatric/Behavioral: Negative.    ? ?All other ROS reviewed and negative.    ? ?Physical Exam/Data:  ? ?Vitals:  ? 08/05/21 0750 08/05/21 0804 08/05/21 0845 08/05/21 0900  ?BP: (!) 203/110 (!) 221/120 (!) 205/111 (!) 202/96   ?Pulse: 92 88 93 88  ?Resp: (!) 31 15 (!) 23 18  ?Temp:      ?TempSrc:      ?SpO2: 98% 98% 94% 95%  ?Weight:      ?Height:      ? ? ?Intake/Output Summary (Last 24 hours) at 08/05/2021 0916 ?Last data filed at 08/05/2021 0537 ?Gross per 24 hour  ?Intake 2000 ml  ?Output --  ?Net 2000 ml  ? ? ?  08/04/2021  ? 11:40 PM  ?Last 3 Weights  ?Weight (lbs) 112 lb  ?Weight (kg) 50.803 kg  ?   ?Body mass index is 18.08 kg/m?.  ?General:  Well nourished, well developed, in no mild distress from abdominal pain moaning of rolling on emergency department stretcher ?HEENT: normal ?Neck: no JVD ?Vascular: No carotid bruits; Distal pulses 2+ bilaterally ?Cardiac:  normal S1, S2; tachycardia,  RRR; no murmur  ?  Lungs:  clear to auscultation bilaterally, no wheezing, rhonchi or rales  ?Abd: soft, tender to mild palpitation, no hepatomegaly  ?Ext: no edema ?Musculoskeletal:  No deformities, BUE and BLE strength normal and equal ?Skin: warm and dry  ?Neuro:  CNs 2-12 intact, no focal abnormalities noted ?Psych:  Normal affect  ? ?EKG:  The EKG was personally reviewed and demonstrates:  SR rate of 97, bi atrial enlargement, peaked T waves, LVH ?Telemetry:  Telemetry was personally reviewed and demonstrates:  Sinus tachycardia rate 101, left atrial enlargement, occasional unifocal PVC's, artifact,  ? ?Relevant CV Studies: ?LHC done 02/2020  ?20-30 % stenosis of the RCA, 30-40% left main, and 20% stensosis of the left circumflex. No intervention was done and she was started on GDMT  ? ?Laboratory Data: ? ?High Sensitivity Troponin:   ?Recent Labs  ?Lab 08/04/21 ?2342 08/05/21 ?0115  ?TROPONINIHS 410* 338*  ?   ?Chemistry ?Recent Labs  ?Lab 08/04/21 ?2342  ?NA 131*  ?K 3.5  ?CL 96*  ?CO2 19*  ?GLUCOSE 181*  ?BUN 25*  ?CREATININE 1.06*  ?CALCIUM 9.2  ?GFRNONAA 58*  ?ANIONGAP 16*  ?  ?Recent Labs  ?Lab 08/04/21 ?2342  ?PROT 8.4*  ?ALBUMIN 3.9  ?AST 43*  ?ALT 24  ?ALKPHOS 91  ?BILITOT 1.0  ? ?Lipids No results for input(s): CHOL, TRIG, HDL,  LABVLDL, LDLCALC, CHOLHDL in the last 168 hours.  ?Hematology ?Recent Labs  ?Lab 08/04/21 ?2342 08/05/21 ?[id number]  ?WBC 15.6* 17.2*  ?RBC 5.28* 5.14*  ?HGB 15.7* 15.5*  ?HCT 45.4 45.8  ?MCV 86.0 89.1  ?MCH 29.7 3

## 2021-08-05 NOTE — Assessment & Plan Note (Addendum)
Blood pressure improved after home medications resumed. ?Continue clonidine 0.1 mg twice daily, as needed hydralazine.and metoprolol ?

## 2021-08-05 NOTE — Consult Note (Signed)
ANTICOAGULATION CONSULT NOTE  ? ?Pharmacy Consult for Heparin ?Indication: chest pain/ACS ? ?No Known Allergies ? ?Patient Measurements: ?Height: 5\' 6"  (167.6 cm) ?Weight: 50.8 kg (112 lb) ?IBW/kg (Calculated) : 59.3 ?Heparin Dosing Weight: 50.8 kg ? ?Vital Signs: ?Temp: 97.9 ?F (36.6 ?C) (05/14 2331) ?Temp Source: Oral (05/14 2331) ?BP: 184/99 (05/14 2331) ?Pulse Rate: 120 (05/14 2331) ? ?Labs: ?Recent Labs  ?  08/04/21 ?2342  ?HGB 15.7*  ?HCT 45.4  ?PLT 564*  ?CREATININE 1.06*  ?TROPONINIHS 410*  ? ? ?Estimated Creatinine Clearance: 42.4 mL/min (A) (by C-G formula based on SCr of 1.06 mg/dL (H)). ? ? ?Medical History: ?Past Medical History:  ?Diagnosis Date  ? Coronary artery disease   ? Hypertension   ? Pancreatitis   ? ? ?Medications:  ?(Not in a hospital admission)  ?Scheduled:  ? ondansetron (ZOFRAN) IV  4 mg Intravenous STAT  ? ?Infusions:  ? lactated ringers    ? ?PRN:  ?Anti-infectives (From admission, onward)  ? ? None  ? ?  ? ? ?Assessment: ?Pharmacy consulted to start heparin for NSTEMI. CBC stable and trop 410. No medications PTA listed, no DOAC PTA. Baseline labs ordered.  ?   ?Goal of Therapy:  ?Heparin level 0.3-0.7 units/ml ?Monitor platelets by anticoagulation protocol: Yes ?  ?Plan:  ?Give 3000 units bolus x 1 ?Start heparin infusion at 600 units/hr ?Check anti-Xa level in 6 hours and daily while on heparin ?Continue to monitor H&H and platelets ? ?Oswald Hillock, PharmD, BCPS ?08/05/2021,1:07 AM ? ? ?

## 2021-08-06 ENCOUNTER — Inpatient Hospital Stay (HOSPITAL_COMMUNITY)
Admit: 2021-08-06 | Discharge: 2021-08-06 | Disposition: A | Discharge status: Home / Self Care | Payer: Medicare Other | Attending: Cardiovascular Disease | Admitting: Cardiovascular Disease

## 2021-08-06 ENCOUNTER — Encounter: Payer: Self-pay | Admitting: Internal Medicine

## 2021-08-06 DIAGNOSIS — R000 Tachycardia, unspecified: Secondary | ICD-10-CM | POA: Diagnosis not present

## 2021-08-06 DIAGNOSIS — R002 Palpitations: Secondary | ICD-10-CM

## 2021-08-06 DIAGNOSIS — R079 Chest pain, unspecified: Secondary | ICD-10-CM | POA: Diagnosis not present

## 2021-08-06 DIAGNOSIS — I248 Other forms of acute ischemic heart disease: Secondary | ICD-10-CM

## 2021-08-06 LAB — URINE DRUG SCREEN, QUALITATIVE (ARMC ONLY)
Amphetamines, Ur Screen: NOT DETECTED
Barbiturates, Ur Screen: NOT DETECTED
Benzodiazepine, Ur Scrn: POSITIVE — AB
Cannabinoid 50 Ng, Ur ~~LOC~~: POSITIVE — AB
Cocaine Metabolite,Ur ~~LOC~~: NOT DETECTED
MDMA (Ecstasy)Ur Screen: NOT DETECTED
Methadone Scn, Ur: NOT DETECTED
Opiate, Ur Screen: NOT DETECTED
Phencyclidine (PCP) Ur S: NOT DETECTED
Tricyclic, Ur Screen: NOT DETECTED

## 2021-08-06 LAB — BASIC METABOLIC PANEL
Anion gap: 6 (ref 5–15)
BUN: 16 mg/dL (ref 8–23)
CO2: 29 mmol/L (ref 22–32)
Calcium: 8.6 mg/dL — ABNORMAL LOW (ref 8.9–10.3)
Chloride: 99 mmol/L (ref 98–111)
Creatinine, Ser: 1.02 mg/dL — ABNORMAL HIGH (ref 0.44–1.00)
GFR, Estimated: >60 mL/min (ref >60)
Glucose, Bld: 104 mg/dL — ABNORMAL HIGH (ref 70–99)
Potassium: 3.6 mmol/L (ref 3.5–5.1)
Sodium: 134 mmol/L — ABNORMAL LOW (ref 135–145)

## 2021-08-06 LAB — ECHOCARDIOGRAM COMPLETE
AR max vel: 2.75 cm2
AV Area VTI: 2.71 cm2
AV Area mean vel: 2.76 cm2
AV Mean grad: 6 mmHg
AV Peak grad: 10.5 mmHg
Ao pk vel: 1.62 m/s
Area-P 1/2: 2.97 cm2
MV VTI: 5.24 cm2
S' Lateral: 1.93 cm

## 2021-08-06 LAB — URINE CULTURE: Culture: >=100000 — AB

## 2021-08-06 LAB — HEPARIN LEVEL (UNFRACTIONATED)
Heparin Unfractionated: <0.1 IU/mL — ABNORMAL LOW (ref 0.30–0.70)
Heparin Unfractionated: 0.25 IU/mL — ABNORMAL LOW (ref 0.30–0.70)
Heparin Unfractionated: 0.46 IU/mL (ref 0.30–0.70)
Heparin Unfractionated: 0.42 IU/mL (ref 0.30–0.70)

## 2021-08-06 MED ORDER — MONTELUKAST SODIUM 10 MG PO TABS
10.0000 mg | ORAL_TABLET | Freq: Every day | ORAL | Status: DC
  Administered 2021-08-07: 10 mg via ORAL
  Filled 2021-08-06 (×2): qty 1

## 2021-08-06 MED ORDER — HEPARIN BOLUS VIA INFUSION
800.0000 [IU] | Freq: Once | INTRAVENOUS | Status: AC
  Administered 2021-08-06: 800 [IU] via INTRAVENOUS
  Filled 2021-08-06: qty 800

## 2021-08-06 MED ORDER — SODIUM CHLORIDE 0.9 % IV BOLUS
500.0000 mL | Freq: Once | INTRAVENOUS | Status: AC
  Administered 2021-08-06: 500 mL via INTRAVENOUS

## 2021-08-06 MED ORDER — METOPROLOL TARTRATE 25 MG PO TABS
12.5000 mg | ORAL_TABLET | Freq: Two times a day (BID) | ORAL | Status: DC
Start: 2021-08-06 — End: 2021-08-07
  Administered 2021-08-06 – 2021-08-07 (×2): 12.5 mg via ORAL
  Filled 2021-08-06 (×2): qty 1

## 2021-08-06 MED ORDER — SODIUM CHLORIDE 0.9 % IV SOLN
12.5000 mg | Freq: Four times a day (QID) | INTRAVENOUS | Status: DC | PRN
  Administered 2021-08-06: 12.5 mg via INTRAVENOUS
  Filled 2021-08-06: qty 12.5

## 2021-08-06 MED ORDER — FAMOTIDINE 20 MG PO TABS: 20.0000 mg | ORAL_TABLET | Freq: Every day | ORAL | Status: DC

## 2021-08-06 MED ORDER — CLONIDINE HCL 0.1 MG PO TABS
0.1000 mg | ORAL_TABLET | Freq: Two times a day (BID) | ORAL | Status: DC
  Administered 2021-08-06 – 2021-08-07 (×3): 0.1 mg via ORAL
  Filled 2021-08-06 (×3): qty 1

## 2021-08-06 MED ORDER — VENLAFAXINE HCL ER 75 MG PO CP24
75.0000 mg | ORAL_CAPSULE | Freq: Every day | ORAL | Status: DC
Start: 2021-08-06 — End: 2021-08-07
  Administered 2021-08-07: 75 mg via ORAL
  Filled 2021-08-06 (×2): qty 1

## 2021-08-06 MED ORDER — ASPIRIN EC 81 MG PO TBEC: 81.0000 mg | DELAYED_RELEASE_TABLET | Freq: Every day | ORAL | Status: DC

## 2021-08-06 NOTE — Progress Notes (Addendum)
ANTICOAGULATION CONSULT NOTE  ? ?Pharmacy Consult for Heparin ?Indication: chest pain/ACS ? ?Allergies  ?Allergen Reactions  ? Ondansetron Hcl   ?  Other reaction(s): Other (comments) ?Does not respond well  ? Short Ragweed Pollen Ext   ?  Other reaction(s): Sneezing  ? ? ?Patient Measurements: ?Height: 5\' 6"  (167.6 cm) ?Weight: 50.8 kg (112 lb) ?IBW/kg (Calculated) : 59.3 ?Heparin Dosing Weight: 50.8 kg ? ?Vital Signs: ?Temp: 98 ?F (36.7 ?C) (05/16 2123) ?Temp Source: Oral (05/16 2123) ?BP: 139/77 (05/16 2123) ?Pulse Rate: 77 (05/16 2123) ? ?Labs: ?Recent Labs  ?  08/04/21 ?2342 08/05/21 ?0114 08/05/21 ?0115 08/05/21 ?[id number] 08/05/21 ?1449 08/05/21 ?1559 08/06/21 ?[id number] 08/06/21 ?1416 08/06/21 ?2047  ?HGB 15.7*  --   --  15.5*  --   --   --   --   --   ?HCT 45.4  --   --  45.8  --   --   --   --   --   ?PLT 564*  --   --  546*  --   --   --   --   --   ?APTT  --  26  --   --   --   --   --   --   --   ?LABPROT  --  12.9  --   --   --   --   --   --   --   ?INR  --  1.0  --   --   --   --   --   --   --   ?HEPARINUNFRC  --   --   --  0.10*  --    < > 0.25* 0.46 0.42  ?CREATININE 1.06*  --   --   --  0.90  --  1.02*  --   --   ?TROPONINIHS 410*  --  338*  --   --   --   --   --   --   ? < > = values in this interval not displayed.  ? ? ? ?Estimated Creatinine Clearance: 44.1 mL/min (A) (by C-G formula based on SCr of 1.02 mg/dL (H)). ? ? ?Medical History: ?Past Medical History:  ?Diagnosis Date  ? Coronary artery disease   ? Hypertension   ? Metabolic acidosis, increased anion gap (IAG) 08/05/2021  ? Pancreatitis   ? ? ?Medications:  ?Medications Prior to Admission  ?Medication Sig Dispense Refill Last Dose  ? albuterol (VENTOLIN HFA) 108 (90 Base) MCG/ACT inhaler Inhale 2 puffs into the lungs every 6 (six) hours as needed for wheezing or shortness of breath.   prn at prn  ? ascorbic acid (VITAMIN C) 500 MG tablet Take 4,000 mg by mouth in the morning.   08/02/2021 at 1000  ? aspirin EC 81 MG tablet Take 81 mg by mouth  daily. Swallow whole.   08/02/2021 at 1000  ? Calcium-Magnesium 600-300 MG/6.8GM POWD Take 1 packet by mouth daily.   08/02/2021 at 1000  ? cloNIDine (CATAPRES) 0.1 MG tablet Take 0.1 mg by mouth 2 (two) times daily.   08/04/2021 at 1400  ? colestipol (COLESTID) 1 g tablet Take 2 g by mouth daily as needed.   prn at prn  ? famotidine (PEPCID) 20 MG tablet Take 20 mg by mouth daily.   08/02/2021 at 1000  ? metoprolol succinate (TOPROL-XL) 25 MG 24 hr tablet Take by mouth in the morning and at bedtime.   08/04/2021 at  1400  ? montelukast (SINGULAIR) 10 MG tablet Take 10 mg by mouth daily.   08/01/2021 at 2359  ? Multiple Vitamin (DAILY VITES) tablet Take 1 tablet by mouth daily.   08/02/2021 at 1000  ? venlafaxine XR (EFFEXOR-XR) 75 MG 24 hr capsule Take 1 capsule by mouth daily.   08/02/2021 at 1000  ? ?Scheduled:  ? aspirin EC  81 mg Oral Daily  ? atorvastatin  40 mg Oral Daily  ? cloNIDine  0.1 mg Oral BID  ? famotidine  20 mg Oral Daily  ? metoprolol tartrate  12.5 mg Oral BID  ? montelukast  10 mg Oral Daily  ? venlafaxine XR  75 mg Oral Daily  ? ?Infusions:  ? heparin 1,000 Units/hr (08/06/21 0730)  ? promethazine (PHENERGAN) injection (IM or IVPB) 12.5 mg (08/06/21 1213)  ? ?PRN:  ?Anti-infectives (From admission, onward)  ? ? None  ? ?  ?Heparin Dosing Weight: 50.8 kg ? ?Assessment: ?Pharmacy consulted to start heparin for NSTEMI. CBC stable and trop 410. No medications PTA listed, no DOAC PTA. Baseline labs ordered.  ?   ?Goal of Therapy:  ?Heparin level 0.3-0.7 units/ml ?Monitor platelets by anticoagulation protocol: Yes ? ?Results: ?5/15@0728  HL 0.10 Subtherapeutic ?5/15@1559  HL 0.23 Subtherapeutic ?5/15@2310  HL <0.1 Subtherapeutic ?5/16@0627  HL 0.25 Subtherapeutic 900>1000 un/hr ?5/16@1416  HL 0.46 Therapeutic x 1; 1000 un/hr ?5/16@2047  HL 0.42 Thera x2; 1000 un/hr ?  ?Plan:  ?Heparin gtt to be stopped on 5/17 @0300  (48hr mark per Cards. ? HL was therapeutic consecutively. ?Continue heparin infusion at 1000  units/hr until stop date. ?Recheck HL daily with AM labs since consecutively therapeutic IF resumed or continued. ?Continue to monitor H&H and platelets ? ?Lorna Dibble, PharmD, BCCP ?Clinical Pharmacist ?08/06/2021 10:08 PM ? ? ? ? ? ?

## 2021-08-06 NOTE — Consult Note (Signed)
ANTICOAGULATION CONSULT NOTE  ? ?Pharmacy Consult for Heparin ?Indication: chest pain/ACS ? ?Allergies  ?Allergen Reactions  ? Ondansetron Hcl   ?  Other reaction(s): Other (comments) ?Does not respond well  ? Short Ragweed Pollen Ext   ?  Other reaction(s): Sneezing  ? ? ?Patient Measurements: ?Height: 5\' 6"  (167.6 cm) ?Weight: 50.8 kg (112 lb) ?IBW/kg (Calculated) : 59.3 ?Heparin Dosing Weight: 50.8 kg ? ?Vital Signs: ?Temp: 98.1 ?F (36.7 ?C) (05/15 2328) ?BP: 123/92 (05/15 2328) ?Pulse Rate: 88 (05/15 2328) ? ?Labs: ?Recent Labs  ?  08/04/21 ?2342 08/05/21 ?0114 08/05/21 ?0115 08/05/21 ?08/07/21 08/05/21 ?1449 08/05/21 ?1559 08/05/21 ?2310  ?HGB 15.7*  --   --  15.5*  --   --   --   ?HCT 45.4  --   --  45.8  --   --   --   ?PLT 564*  --   --  546*  --   --   --   ?APTT  --  26  --   --   --   --   --   ?LABPROT  --  12.9  --   --   --   --   --   ?INR  --  1.0  --   --   --   --   --   ?HEPARINUNFRC  --   --   --  0.10*  --  0.23* <0.10*  ?CREATININE 1.06*  --   --   --  0.90  --   --   ?TROPONINIHS 410*  --  338*  --   --   --   --   ? ? ? ?Estimated Creatinine Clearance: 50 mL/min (by C-G formula based on SCr of 0.9 mg/dL). ? ? ?Medical History: ?Past Medical History:  ?Diagnosis Date  ? Coronary artery disease   ? Hypertension   ? Pancreatitis   ? ? ?Medications:  ?Medications Prior to Admission  ?Medication Sig Dispense Refill Last Dose  ? albuterol (VENTOLIN HFA) 108 (90 Base) MCG/ACT inhaler Inhale 2 puffs into the lungs every 6 (six) hours as needed for wheezing or shortness of breath.   prn at prn  ? ascorbic acid (VITAMIN C) 500 MG tablet Take 4,000 mg by mouth in the morning.   08/02/2021 at 1000  ? aspirin EC 81 MG tablet Take 81 mg by mouth daily. Swallow whole.   08/02/2021 at 1000  ? Calcium-Magnesium 600-300 MG/6.8GM POWD Take 1 packet by mouth daily.   08/02/2021 at 1000  ? cloNIDine (CATAPRES) 0.1 MG tablet Take 0.1 mg by mouth 2 (two) times daily.   08/04/2021 at 1400  ? colestipol (COLESTID) 1 g tablet  Take 2 g by mouth daily as needed.   prn at prn  ? famotidine (PEPCID) 20 MG tablet Take 20 mg by mouth daily.   08/02/2021 at 1000  ? metoprolol succinate (TOPROL-XL) 25 MG 24 hr tablet Take by mouth in the morning and at bedtime.   08/04/2021 at 1400  ? montelukast (SINGULAIR) 10 MG tablet Take 10 mg by mouth daily.   08/01/2021 at 2359  ? Multiple Vitamin (DAILY VITES) tablet Take 1 tablet by mouth daily.   08/02/2021 at 1000  ? venlafaxine XR (EFFEXOR-XR) 75 MG 24 hr capsule Take 1 capsule by mouth daily.   08/02/2021 at 1000  ? ?Scheduled:  ? aspirin EC  81 mg Oral Daily  ? atorvastatin  40 mg Oral Daily  ? famotidine  20 mg Oral Daily  ? ?Infusions:  ? heparin 900 Units/hr (08/05/21 1738)  ? sodium bicarbonate in 0.45 NS mL infusion Stopped (08/05/21 1740)  ? ?PRN:  ?Anti-infectives (From admission, onward)  ? ? None  ? ?  ? ? ?Assessment: ?Pharmacy consulted to start heparin for NSTEMI. CBC stable and trop 410. No medications PTA listed, no DOAC PTA. Baseline labs ordered.  ?   ?Goal of Therapy:  ?Heparin level 0.3-0.7 units/ml ?Monitor platelets by anticoagulation protocol: Yes ? ?Results: ?5/15@0728  HL 0.10 Subtherapeutic ?5/15@1559  HL 0.23 Subtherapeutic ?5/15@2310  HL <0.1 Subtherapeutic ?  ?Plan:  ?Insurance claims handler. Per RN, IV removed for unknown amount of time.  IV replaced and heparin was restarted 5/16 a little after midnight.   ?Continue heparin infusion at 900 units/hr ?Recheck HL in 6 hours from restart and daily while on heparin ?Continue to monitor H&H and platelets ? ?Otelia Sergeant, PharmD, MBA ?08/06/2021 ?1:13 AM ? ? ? ? ?

## 2021-08-06 NOTE — Progress Notes (Signed)
? ?Progress Note ? ?Patient Name: Veronica Warner ?Date of Encounter: 08/06/2021 ? ?[facility] Cardiologist: Kathlyn Sacramento, MD ? ?Subjective  ? ?Patient seen on AM rounds. Denies any chest pain or shortness of breath. States that after taking Mylanta her abdominal pain as resolved as well. Blood pressure has been better controlled since the re-starting of her home medication regimen.  ? ?Inpatient Medications  ?  ?Scheduled Meds: ? aspirin EC  81 mg Oral Daily  ? atorvastatin  40 mg Oral Daily  ? cloNIDine  0.1 mg Oral BID  ? famotidine  20 mg Oral Daily  ? montelukast  10 mg Oral Daily  ? venlafaxine XR  75 mg Oral Daily  ? ?Continuous Infusions: ? heparin 1,000 Units/hr (08/06/21 0730)  ? sodium bicarbonate in 0.45 NS mL infusion Stopped (08/05/21 1740)  ? ?PRN Meds: ?acetaminophen, ALPRAZolam, alum & mag hydroxide-simeth, hydrALAZINE, nitroGLYCERIN, ondansetron (ZOFRAN) IV  ? ?Vital Signs  ?  ?Vitals:  ? 08/05/21 2004 08/05/21 2328 08/06/21 0407 08/06/21 [id number]  ?BP: 129/64 (!) 123/92 134/66 132/77  ?Pulse: 96 88 69 88  ?Resp: 19 20 20 15   ?Temp: 98.1 ?F (36.7 ?C) 98.1 ?F (36.7 ?C) 97.7 ?F (36.5 ?C) 98.5 ?F (36.9 ?C)  ?TempSrc:      ?SpO2: 98% 97% 99% 98%  ?Weight:      ?Height:      ? ? ?Intake/Output Summary (Last 24 hours) at 08/06/2021 0950 ?Last data filed at 08/06/2021 0410 ?Gross per 24 hour  ?Intake 237.93 ml  ?Output --  ?Net 237.93 ml  ? ? ?  08/04/2021  ? 11:40 PM  ?Last 3 Weights  ?Weight (lbs) 112 lb  ?Weight (kg) 50.803 kg  ?   ? ?Telemetry  ?  ?SR, PAC's, PVC's and occasional couplet PVC's, and with noted artifact- Personally Reviewed ? ?ECG  ?  ?SR rate of 88, LAE, and LVH - Personally Reviewed ? ?Physical Exam  ? ?GEN: No acute distress.   ?Neck: No JVD ?Cardiac: RRR, II/VI systolic murmur that radiates into the bilateral carotids, without rubs, or gallops.  ?Respiratory: Clear to auscultation bilaterally. Respirations are unlabored at rest on room air. ?GI: Soft, nontender, non-distended  ?MS:  No edema; No deformity. ?Neuro:  Nonfocal  ?Psych: Normal affect  ? ?Labs  ?  ?High Sensitivity Troponin:   ?Recent Labs  ?Lab 08/04/21 ?2342 08/05/21 ?0115  ?TROPONINIHS 410* 338*  ?   ?Chemistry ?Recent Labs  ?Lab 08/04/21 ?2342 08/05/21 ?1449 08/06/21 ?[id number]  ?NA 131* 135 134*  ?K 3.5 2.8* 3.6  ?CL 96* 98 99  ?CO2 19* 24 29  ?GLUCOSE 181* 160* 104*  ?BUN 25* 13 16  ?CREATININE 1.06* 0.90 1.02*  ?CALCIUM 9.2 8.7* 8.6*  ?PROT 8.4*  --   --   ?ALBUMIN 3.9  --   --   ?AST 43*  --   --   ?ALT 24  --   --   ?ALKPHOS 91  --   --   ?BILITOT 1.0  --   --   ?GFRNONAA 58* >60 >60  ?ANIONGAP 16* 13 6  ?  ?Lipids No results for input(s): CHOL, TRIG, HDL, LABVLDL, LDLCALC, CHOLHDL in the last 168 hours.  ?Hematology ?Recent Labs  ?Lab 08/04/21 ?2342 08/05/21 ?[id number]  ?WBC 15.6* 17.2*  ?RBC 5.28* 5.14*  ?HGB 15.7* 15.5*  ?HCT 45.4 45.8  ?MCV 86.0 89.1  ?MCH 29.7 30.2  ?MCHC 34.6 33.8  ?RDW 13.9 14.2  ?PLT 564* 546*  ? ?  Thyroid  ?Recent Labs  ?Lab 08/05/21 ?0115  ?TSH 0.962  ?  ?BNPNo results for input(s): BNP, PROBNP in the last 168 hours.  ?DDimer No results for input(s): DDIMER in the last 168 hours.  ? ?Radiology  ?  ?DG Chest 2 View ? ?Result Date: 08/05/2021 ?CLINICAL DATA:  Shortness of breath EXAM: CHEST - 2 VIEW COMPARISON:  None Available. FINDINGS: Lungs are clear.  No pleural effusion or pneumothorax. The heart is normal in size. Visualized osseous structures are within normal limits. IMPRESSION: Normal chest radiographs. Electronically Signed   By: Julian Hy M.D.   On: 08/05/2021 00:29  ? ?CT ABDOMEN PELVIS W CONTRAST ? ?Result Date: 08/05/2021 ?CLINICAL DATA:  Abdominal pain, weakness, nausea/vomiting EXAM: CT ABDOMEN AND PELVIS WITH CONTRAST TECHNIQUE: Multidetector CT imaging of the abdomen and pelvis was performed using the standard protocol following bolus administration of intravenous contrast. RADIATION DOSE REDUCTION: This exam was performed according to the departmental dose-optimization program which  includes automated exposure control, adjustment of the mA and/or kV according to patient size and/or use of iterative reconstruction technique. CONTRAST:  72mL OMNIPAQUE IOHEXOL 300 MG/ML  SOLN COMPARISON:  None Available. FINDINGS: Lower chest: Lung bases are clear. Hepatobiliary: Liver is within normal limits. Status post cholecystectomy. No intrahepatic ductal dilatation. Common duct measures 15 mm and smoothly tapers at the ampulla, likely postsurgical. Pancreas: Within normal limits. Spleen: Within normal limits. Adrenals/Urinary Tract: Adrenal glands are within normal limits. Subcentimeter right lower pole renal cyst. Left kidney is within normal limits. No hydronephrosis. Bladder is within normal limits. Stomach/Bowel: Stomach is within normal limits. No evidence of bowel obstruction. Appendix is not discretely visualized. Extensive left colonic diverticulosis, without convincing pericolonic inflammatory changes to suggest acute diverticulitis. Vascular/Lymphatic: No evidence of abdominal aortic aneurysm. Atherosclerotic calcifications of the abdominal aorta and branch vessels. No suspicious abdominopelvic lymphadenopathy. Reproductive: Uterus is within normal limits. No adnexal masses. Other: No abdominopelvic ascites. Faint mesenteric stranding beneath the anterior abdominal wall, nonspecific. 9 mm coarse calcified lesion along the anterior abdominal mesentery (series 3/image 40), indeterminate. Musculoskeletal: Visualized osseous structures are within normal limits. IMPRESSION: Extensive left colonic diverticulosis, without evidence of diverticulitis. No evidence of bowel obstruction. Appendix is not discretely visualized. Status post cholecystectomy. Electronically Signed   By: Julian Hy M.D.   On: 08/05/2021 01:45   ? ?Cardiac Studies  ? ?Echocardiogram ordered and pending ? ?Patient Profile  ?   ?66 y.o. 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 Damita Dunnings.  ? ?Assessment & Plan  ?  ?NSTEMI, likely secondary to demand ischemia from hypertensive urgency, tachyarrhythmia and possible gastritis ?- HS troponins 410-338 ?- Currently on Heparin drip, if no wall motion abnormalities seen on echocardiogram can d/c drip ?- Depending on echocardiogram results will determine if any other ischemic work-up is needed as inpatient ?- Can consider outpatient stress or cardiac CT at a later date ?- Continue cardiac monitor ?- Echocardiogram ordered and pending ?- Continue ASA 81 mg daily ? ?2. Hypertensive urgency - resolved ?- Continue clonidine 0.1 mg bid ?- Continue PRN hydralazine 10 mg IVP every 8 hours  ?- Vitals per unit protocol ? ?3. Hyperlipidemia ?- Continue atorvastatin 40 mg daily ?- lipid panel ?   ?4. Tachyarrhythmia ?- Rate have been better controlled over  night since pain has resolved ?- TSH resulted WNL ?- Consider ZIO XT outpatient ? ?For questions or updates, please contact [place] ?Please consult [email or web address] for contact info under  ? ?  ?   ?Signed, ?Genie Mirabal, NP  ?08/06/2021, 9:50 AM   ? ?

## 2021-08-06 NOTE — Progress Notes (Signed)
?Progress Note ? ? ?Patient: Veronica Warner [id number] DOB: [birth year] DOA: 08/05/2021     1 ? ?DOS: the patient was seen and examined on 08/06/2021 ?  ?Brief hospital course: ?This 66 years old female with PMH significant for hypertension,  remote alcohol use, Takotsubo / stress cardiomyopathy in 2021, who presents in the ED with 3-day history of generalized abdominal pain associated with nausea, vomiting and palpitations.  Patient denies any chest pain or shortness of breath.  EMS reported irregular rhythm like A-fib.  On arrival patient was tachycardic with heart rate in 120s,  respiratory rate 28,  blood pressure 2 02/102.  Lactic acid 2.8,  troponin 410> 338.  CT abdomen and pelvis showing extensive colonic diverticulosis without diverticulitis.  Patient is admitted for further evaluation. Cardiology was consulted. ? ?Assessment and Plan: ?* Tachyarrhythmia ?Patient presented with palpitations, nausea, vomiting and diarrhea. ?This could be from acute gastroenteritis. ?TSH normal.  Continue IV hydration, supportive care. ?Continue metoprolol as needed for tachyarrhythmia ?Continuous cardiac monitoring ? ? ?Hypertensive urgency ?Blood pressure improved after home medications resumed. ?Continue clonidine 0.1 mg twice daily, as needed hydralazine. ? ?Acute gastroenteritis ?Continue IV hydration, IV antiemetics and supportive care ?Clear liquid diet ?Follow-up GI panel ? ?Elevated troponin ?Suspect demand ischemia from tachyarrhythmias, hypertensive urgency.  Troponin 410 trended down to 338. ?Patient started on heparin drip.  Cardiology consulted. ?Obtain 2D echocardiogram.  Plan discontinue heparin if no wall motion abnormalities on echo. ?We will determine ischemic work-up based on the echo report. ?Can consider outpatient stress test or CT chest. ?Echocardiogram is pending.  Continue aspirin 81 mg. ? ?Lactic acidosis ?Secondary to acute gastroenteritis.   ?Lactic acidosis resolved.  Sepsis ruled out. ? ? ?AKI  (acute kidney injury) ([place]) ?Likely secondary to acute gastroenteritis.  ?Continue IV hydration, avoid nephrotoxic medications.   ?Continue to monitor serum creatinine ? ?Palpitations ?Continue metoprolol. ? ?Metabolic acidosis, increased anion gap (IAG)-resolved as of 08/06/2021 ?Secondary to AKI and lactic acidosis ?IV hydration with sodium bicarb and serial labs ? ? ?Subjective: Patient was seen and examined at bedside.  Overnight events noted.   ?Patient reports feeling much improved. ?Her blood pressure is improving after home medications resumed. ? ?Physical Exam: ?Vitals:  ? 08/05/21 2328 08/06/21 0407 08/06/21 0721 08/06/21 1209  ?BP: (!) 123/92 134/66 132/77 (!) 181/94  ?Pulse: 88 69 88 100  ?Resp: 20 20 15 16   ?Temp: 98.1 ?F (36.7 ?C) 97.7 ?F (36.5 ?C) 98.5 ?F (36.9 ?C) 98.5 ?F (36.9 ?C)  ?TempSrc:      ?SpO2: 97% 99% 98% 92%  ?Weight:      ?Height:      ? ?General exam: Appears comfortable, not in any acute distress.  Anxious ?Respiratory system: CTA bilaterally, no wheezing, no crackles, normal respiratory effort. ?Cardiovascular system: S1-S2 heard, regular rate and rhythm, no murmur. ?Gastrointestinal system: .  Abdomen is soft nontender nondistended, BS+ ?Central nervous system:, Oriented x3, no focal neurological deficits. ?Extremities: NO edema, No cyanosis, no clubbing. ?Psychiatry: Mood, insight, judgment normal. ? ?Data Reviewed: ?I have Reviewed nursing notes, Vitals, and Lab results since pt's last encounter. Pertinent lab results CBC, BMP, mag, Phos, troponin ?I have ordered test including CBC, BMP, mag, Phos ?I have reviewed the last note from cardiologist,  ?I have discussed pt's care plan and test results with patient.   ? ?Family Communication: No family at bedside ? ?Disposition: ?Status is: Inpatient ?Remains inpatient appropriate because: Admitted for elevated troponin could be secondary to demand ischemia patient  is on IV heparin awaiting echocardiogram.  Cardiology is consulted. ?  Planned Discharge Destination: Home ? ? ?Time spent: 50  minutes ? ?Author: ?Shawna Clamp, MD ?08/06/2021 12:51 PM ? ?For on call review [email or web address].  ?

## 2021-08-06 NOTE — Progress Notes (Signed)
Walked into patients room and she was drenched in sweat.  Patient had 5 blankets on her and heat was turned up to 80 degrees.  Took set of vital signs and patient was afebrile but SBP had dropped from 180s to 90s within a couple of hours.  Patient lethargic after getting phenergan but able to be aroused.  Notified MD who ordered bolus of NS.   ? ?Addendum: s/p NS bolus, SBP in 130s.  Patient still lethargic but arousable and able to follow commands.   ?

## 2021-08-06 NOTE — Progress Notes (Signed)
ANTICOAGULATION CONSULT NOTE  ? ?Pharmacy Consult for Heparin ?Indication: chest pain/ACS ? ?Allergies  ?Allergen Reactions  ? Ondansetron Hcl   ?  Other reaction(s): Other (comments) ?Does not respond well  ? Short Ragweed Pollen Ext   ?  Other reaction(s): Sneezing  ? ? ?Patient Measurements: ?Height: 5\' 6"  (167.6 cm) ?Weight: 50.8 kg (112 lb) ?IBW/kg (Calculated) : 59.3 ?Heparin Dosing Weight: 50.8 kg ? ?Vital Signs: ?Temp: 98.5 ?F (36.9 ?C) (05/16 1209) ?BP: 98/69 (05/16 1425) ?Pulse Rate: 100 (05/16 1209) ? ?Labs: ?Recent Labs  ?  08/04/21 ?2342 08/05/21 ?0114 08/05/21 ?0115 08/05/21 ?08/07/21 08/05/21 ?1449 08/05/21 ?1559 08/05/21 ?2310 08/06/21 ?08/08/21 08/06/21 ?1416  ?HGB 15.7*  --   --  15.5*  --   --   --   --   --   ?HCT 45.4  --   --  45.8  --   --   --   --   --   ?PLT 564*  --   --  546*  --   --   --   --   --   ?APTT  --  26  --   --   --   --   --   --   --   ?LABPROT  --  12.9  --   --   --   --   --   --   --   ?INR  --  1.0  --   --   --   --   --   --   --   ?HEPARINUNFRC  --   --   --  0.10*  --    < > <0.10* 0.25* 0.46  ?CREATININE 1.06*  --   --   --  0.90  --   --  1.02*  --   ?TROPONINIHS 410*  --  338*  --   --   --   --   --   --   ? < > = values in this interval not displayed.  ? ? ? ?Estimated Creatinine Clearance: 44.1 mL/min (A) (by C-G formula based on SCr of 1.02 mg/dL (H)). ? ? ?Medical History: ?Past Medical History:  ?Diagnosis Date  ? Coronary artery disease   ? Hypertension   ? Metabolic acidosis, increased anion gap (IAG) 08/05/2021  ? Pancreatitis   ? ? ?Medications:  ?Medications Prior to Admission  ?Medication Sig Dispense Refill Last Dose  ? albuterol (VENTOLIN HFA) 108 (90 Base) MCG/ACT inhaler Inhale 2 puffs into the lungs every 6 (six) hours as needed for wheezing or shortness of breath.   prn at prn  ? ascorbic acid (VITAMIN C) 500 MG tablet Take 4,000 mg by mouth in the morning.   08/02/2021 at 1000  ? aspirin EC 81 MG tablet Take 81 mg by mouth daily. Swallow whole.    08/02/2021 at 1000  ? Calcium-Magnesium 600-300 MG/6.8GM POWD Take 1 packet by mouth daily.   08/02/2021 at 1000  ? cloNIDine (CATAPRES) 0.1 MG tablet Take 0.1 mg by mouth 2 (two) times daily.   08/04/2021 at 1400  ? colestipol (COLESTID) 1 g tablet Take 2 g by mouth daily as needed.   prn at prn  ? famotidine (PEPCID) 20 MG tablet Take 20 mg by mouth daily.   08/02/2021 at 1000  ? metoprolol succinate (TOPROL-XL) 25 MG 24 hr tablet Take by mouth in the morning and at bedtime.   08/04/2021 at 1400  ? montelukast (SINGULAIR)  10 MG tablet Take 10 mg by mouth daily.   08/01/2021 at 2359  ? Multiple Vitamin (DAILY VITES) tablet Take 1 tablet by mouth daily.   08/02/2021 at 1000  ? venlafaxine XR (EFFEXOR-XR) 75 MG 24 hr capsule Take 1 capsule by mouth daily.   08/02/2021 at 1000  ? ?Scheduled:  ? aspirin EC  81 mg Oral Daily  ? atorvastatin  40 mg Oral Daily  ? cloNIDine  0.1 mg Oral BID  ? famotidine  20 mg Oral Daily  ? montelukast  10 mg Oral Daily  ? venlafaxine XR  75 mg Oral Daily  ? ?Infusions:  ? heparin 1,000 Units/hr (08/06/21 0730)  ? promethazine (PHENERGAN) injection (IM or IVPB) 12.5 mg (08/06/21 1213)  ? sodium bicarbonate in 0.45 NS mL infusion Stopped (08/05/21 1740)  ? ?PRN:  ?Anti-infectives (From admission, onward)  ? ? None  ? ?  ? ? ?Assessment: ?Pharmacy consulted to start heparin for NSTEMI. CBC stable and trop 410. No medications PTA listed, no DOAC PTA. Baseline labs ordered.  ?   ?Goal of Therapy:  ?Heparin level 0.3-0.7 units/ml ?Monitor platelets by anticoagulation protocol: Yes ? ?Results: ?5/15@0728  HL 0.10 Subtherapeutic ?5/15@1559  HL 0.23 Subtherapeutic ?5/15@2310  HL <0.1 Subtherapeutic ?5/16@0627  HL 0.25 Subtherapeutic  ?5/16@1416  HL 0.46 Therapeutic x 1 ?  ?Plan:  ?Continue heparin infusion at 1000 units/hr ?Recheck HL in 6 hours to confirm. ?Continue to monitor H&H and platelets ? ?Naythan Douthit Rodriguez-Guzman PharmD, BCPS ?08/06/2021 3:00 PM ? ? ? ? ?

## 2021-08-06 NOTE — Consult Note (Addendum)
ANTICOAGULATION CONSULT NOTE  ? ?Pharmacy Consult for Heparin ?Indication: chest pain/ACS ? ?Allergies  ?Allergen Reactions  ? Ondansetron Hcl   ?  Other reaction(s): Other (comments) ?Does not respond well  ? Short Ragweed Pollen Ext   ?  Other reaction(s): Sneezing  ? ? ?Patient Measurements: ?Height: 5\' 6"  (167.6 cm) ?Weight: 50.8 kg (112 lb) ?IBW/kg (Calculated) : 59.3 ?Heparin Dosing Weight: 50.8 kg ? ?Vital Signs: ?Temp: 97.7 ?F (36.5 ?C) (05/16 0407) ?BP: 134/66 (05/16 0407) ?Pulse Rate: 69 (05/16 0407) ? ?Labs: ?Recent Labs  ?  08/04/21 ?2342 08/05/21 ?0114 08/05/21 ?0115 08/05/21 ?[id number] 08/05/21 ?[id number] 08/05/21 ?1449 08/05/21 ?1559 08/05/21 ?2310 08/06/21 ?[id number]  ?HGB 15.7*  --   --  15.5*  --   --   --   --   --   ?HCT 45.4  --   --  45.8  --   --   --   --   --   ?PLT 564*  --   --  546*  --   --   --   --   --   ?APTT  --  26  --   --   --   --   --   --   --   ?LABPROT  --  12.9  --   --   --   --   --   --   --   ?INR  --  1.0  --   --   --   --   --   --   --   ?HEPARINUNFRC  --   --   --  0.10*   < >  --  0.23* <0.10* 0.25*  ?CREATININE 1.06*  --   --   --   --  0.90  --   --  1.02*  ?TROPONINIHS 410*  --  338*  --   --   --   --   --   --   ? < > = values in this interval not displayed.  ? ? ? ?Estimated Creatinine Clearance: 44.1 mL/min (A) (by C-G formula based on SCr of 1.02 mg/dL (H)). ? ? ?Medical History: ?Past Medical History:  ?Diagnosis Date  ? Coronary artery disease   ? Hypertension   ? Pancreatitis   ? ? ?Medications:  ?Medications Prior to Admission  ?Medication Sig Dispense Refill Last Dose  ? albuterol (VENTOLIN HFA) 108 (90 Base) MCG/ACT inhaler Inhale 2 puffs into the lungs every 6 (six) hours as needed for wheezing or shortness of breath.   prn at prn  ? ascorbic acid (VITAMIN C) 500 MG tablet Take 4,000 mg by mouth in the morning.   08/02/2021 at 1000  ? aspirin EC 81 MG tablet Take 81 mg by mouth daily. Swallow whole.   08/02/2021 at 1000  ? Calcium-Magnesium 600-300 MG/6.8GM POWD  Take 1 packet by mouth daily.   08/02/2021 at 1000  ? cloNIDine (CATAPRES) 0.1 MG tablet Take 0.1 mg by mouth 2 (two) times daily.   08/04/2021 at 1400  ? colestipol (COLESTID) 1 g tablet Take 2 g by mouth daily as needed.   prn at prn  ? famotidine (PEPCID) 20 MG tablet Take 20 mg by mouth daily.   08/02/2021 at 1000  ? metoprolol succinate (TOPROL-XL) 25 MG 24 hr tablet Take by mouth in the morning and at bedtime.   08/04/2021 at 1400  ? montelukast (SINGULAIR) 10 MG tablet Take 10 mg by mouth daily.  08/01/2021 at 2359  ? Multiple Vitamin (DAILY VITES) tablet Take 1 tablet by mouth daily.   08/02/2021 at 1000  ? venlafaxine XR (EFFEXOR-XR) 75 MG 24 hr capsule Take 1 capsule by mouth daily.   08/02/2021 at 1000  ? ?Scheduled:  ? aspirin EC  81 mg Oral Daily  ? atorvastatin  40 mg Oral Daily  ? cloNIDine  0.1 mg Oral BID  ? famotidine  20 mg Oral Daily  ? montelukast  10 mg Oral Daily  ? venlafaxine XR  75 mg Oral Daily  ? ?Infusions:  ? heparin 900 Units/hr (08/06/21 0410)  ? sodium bicarbonate in 0.45 NS mL infusion Stopped (08/05/21 1740)  ? ?PRN:  ?Anti-infectives (From admission, onward)  ? ? None  ? ?  ? ? ?Assessment: ?Pharmacy consulted to start heparin for NSTEMI. CBC stable and trop 410. No medications PTA listed, no DOAC PTA. Baseline labs ordered.  ?   ?Goal of Therapy:  ?Heparin level 0.3-0.7 units/ml ?Monitor platelets by anticoagulation protocol: Yes ? ?Results: ?5/15@0728  HL 0.10 Subtherapeutic ?5/15@1559  HL 0.23 Subtherapeutic ?5/15@2310  HL <0.1 Subtherapeutic ?5/16@0627  HL 0.25 Subtherapeutic  ?  ?Plan:  ?Bolus 800 units x 1  ?Increase heparin infusion to 1000 units/hr ?Recheck HL in 6 hours from rate change. ?Continue to monitor H&H and platelets ? ?Renda Rolls, PharmD, MBA ?08/06/2021 ?7:07 AM ? ? ? ? ?

## 2021-08-06 NOTE — Hospital Course (Addendum)
66 years old female with PMH significant for hypertension,  remote alcohol use, Takotsubo / stress cardiomyopathy in 2021, who presents in the ED with 3-day history of generalized abdominal pain associated with nausea, vomiting and palpitations.  Patient denies any chest pain or shortness of breath.  EMS reported irregular rhythm like A-fib.  On arrival patient was tachycardic with heart rate in 120s,  respiratory rate 28,  blood pressure 2 02/102.  Lactic acid 2.8,  troponin 410> 338.  CT abdomen and pelvis showing extensive colonic diverticulosis without diverticulitis.  Patient is admitted for further evaluation. Cardiology was consulted. ?

## 2021-08-07 DIAGNOSIS — R000 Tachycardia, unspecified: Secondary | ICD-10-CM | POA: Diagnosis not present

## 2021-08-07 LAB — CBC
HCT: 40 % (ref 36.0–46.0)
Hemoglobin: 13.2 g/dL (ref 12.0–15.0)
MCH: 29.7 pg (ref 26.0–34.0)
MCHC: 33 g/dL (ref 30.0–36.0)
MCV: 90.1 fL (ref 80.0–100.0)
Platelets: 393 10*3/uL (ref 150–400)
RBC: 4.44 MIL/uL (ref 3.87–5.11)
RDW: 14.2 % (ref 11.5–15.5)
WBC: 7.4 10*3/uL (ref 4.0–10.5)
nRBC: 0 % (ref 0.0–0.2)

## 2021-08-07 LAB — LIPOPROTEIN A (LPA): Lipoprotein (a): 67 nmol/L — ABNORMAL HIGH (ref <75.0)

## 2021-08-07 LAB — GLUCOSE, CAPILLARY: Glucose-Capillary: 89 mg/dL (ref 70–99)

## 2021-08-07 LAB — BASIC METABOLIC PANEL
Anion gap: 6 (ref 5–15)
BUN: 14 mg/dL (ref 8–23)
CO2: 29 mmol/L (ref 22–32)
Calcium: 8.8 mg/dL — ABNORMAL LOW (ref 8.9–10.3)
Chloride: 102 mmol/L (ref 98–111)
Creatinine, Ser: 0.91 mg/dL (ref 0.44–1.00)
GFR, Estimated: >60 mL/min (ref >60)
Glucose, Bld: 100 mg/dL — ABNORMAL HIGH (ref 70–99)
Potassium: 3.9 mmol/L (ref 3.5–5.1)
Sodium: 137 mmol/L (ref 135–145)

## 2021-08-07 LAB — PHOSPHORUS: Phosphorus: 3.3 mg/dL (ref 2.5–4.6)

## 2021-08-07 LAB — MAGNESIUM: Magnesium: 2.4 mg/dL (ref 1.7–2.4)

## 2021-08-07 MED ORDER — NITROGLYCERIN 0.4 MG SL SUBL: 0.4000 mg | SUBLINGUAL_TABLET | SUBLINGUAL | 0 refills | Status: AC | PRN

## 2021-08-07 MED ORDER — ATORVASTATIN CALCIUM 40 MG PO TABS: 40.0000 mg | ORAL_TABLET | Freq: Every day | ORAL | 0 refills | Status: DC

## 2021-08-07 MED ORDER — ALPRAZOLAM 1 MG PO TABS: 1.0000 mg | ORAL_TABLET | Freq: Two times a day (BID) | ORAL | 0 refills | Status: AC | PRN

## 2021-08-07 NOTE — Discharge Instructions (Signed)
Patient advised to establish PCP in the area ?

## 2021-08-07 NOTE — Discharge Summary (Signed)
?Physician Discharge Summary ?  ?Patient: Veronica Warner MRN: [id number] DOB: [birth year]  ?Admit date:     08/05/2021  ?Discharge date: 08/07/21  ?Discharge Physician: Enedina Finner  ? ?PCP: Pcp, No  ? ?Recommendations at discharge:  ? ?follow-up [facility] cardiology in 1 to 2 weeks ?establish primary care ? ? ? ?Discharge Diagnoses ? ?Tachyarrhythmia in the setting of gastroenteritis improved ?acute gastroenteritis resolved ? ?Hospital Course: ?This 66 years old female with PMH significant for hypertension,  remote alcohol use, Takotsubo / stress cardiomyopathy in 2021, who presents in the ED with 3-day history of generalized abdominal pain associated with nausea, vomiting and palpitations.  Patient denies any chest pain or shortness of breath.  EMS reported irregular rhythm like A-fib.  On arrival patient was tachycardic with heart rate in 120s,  respiratory rate 28,  blood pressure 2 02/102.  Lactic acid 2.8,  troponin 410> 338.  CT abdomen and pelvis showing extensive colonic diverticulosis without diverticulitis.  Patient is admitted for further evaluation. Cardiology was consulted. ? ?Assessment and Plan: ?* Tachyarrhythmia ?--Patient presented with palpitations, nausea, vomiting and diarrhea. ?--This could be from acute gastroenteritis. ?--TSH normal.  ?--received IV hydration, supportive care. ?--Continue metoprolol as needed for tachyarrhythmia ?--remains in NSR ?-- cardiology okay to discharge with outpatient follow-up ? ? ?Hypertensive urgency ?Blood pressure improved after home medications resumed. ?Continue clonidine 0.1 mg twice daily, as needed hydralazine.and metoprolol ? ?Acute gastroenteritis ?--recieved IV hydration, IV antiemetics and supportive care ?Clear liquid diet--to soft diet ?Follow-up GI panel--unable to give stool ? ?Elevated troponin ?--Suspect demand ischemia from tachyarrhythmias, hypertensive urgency.  Troponin 410 trended down to 338. ?--Patient started on heparin drip--now d/cec.   Cardiology consulted. ?--no further w/u per cardiology ?--Can consider outpatient stress test or CT chest. ?--  Continue aspirin 81 mg. ?-echo--Left ventricular ejection fraction, by estimation, is >75%. The left  ?ventricle has hyperdynamic function. The left ventricle has no regional  ?wall motion abnormalities. There is mild left ventricular hypertrophy.  ?Indeterminate diastolic filling due to  ? E-A fusion.  ? 2. Right ventricular systolic function is normal. The right ventricular  ?size is normal.  ? ?Lactic acidosis ?Secondary to acute gastroenteritis.   ?Lactic acidosis resolved.  Sepsis ruled out. ? ? ?AKI (acute kidney injury) (HCC) ?Likely secondary to acute gastroenteritis.  ?improved ? ?Palpitations ?Continue metoprolol. ? ?Metabolic acidosis, increased anion gap (IAG)-resolved as of 08/06/2021 ?Secondary to AKI and lactic acidosis ?IV hydration with sodium bicarb and serial labs ? ? ? ? ?  ? ? ?Consultants: [facility] cardiology ?Procedures performed: none  ?Disposition: Home ?Diet recommendation:  ?Discharge Diet Orders (From admission, onward)  ? ?  Start     Ordered  ? 08/07/21 0000  Diet - low sodium heart healthy       ? 08/07/21 0851  ? ?  ?  ? ?  ? ?Cardiac diet ?DISCHARGE MEDICATION: ?Allergies as of 08/07/2021   ? ?   Reactions  ? Ondansetron Hcl   ? Other reaction(s): Other (comments) ?Does not respond well  ? Short Ragweed Pollen Ext   ? Other reaction(s): Sneezing  ? ?  ? ?  ?Medication List  ?  ? ?TAKE these medications   ? ?albuterol 108 (90 Base) MCG/ACT inhaler ?Commonly known as: VENTOLIN HFA ?Inhale 2 puffs into the lungs every 6 (six) hours as needed for wheezing or shortness of breath. ?  ?ALPRAZolam 1 MG tablet ?Commonly known as: Prudy Feeler ?Take 1 tablet (1 mg total) by  mouth 2 (two) times daily as needed for anxiety. ?  ?ascorbic acid 500 MG tablet ?Commonly known as: VITAMIN C ?Take 4,000 mg by mouth in the morning. ?  ?aspirin EC 81 MG tablet ?Take 81 mg by mouth daily. Swallow whole. ?   ?atorvastatin 40 MG tablet ?Commonly known as: LIPITOR ?Take 1 tablet (40 mg total) by mouth daily. ?  ?Calcium-Magnesium 600-300 MG/6.8GM Powd ?Take 1 packet by mouth daily. ?  ?cloNIDine 0.1 MG tablet ?Commonly known as: CATAPRES ?Take 0.1 mg by mouth 2 (two) times daily. ?  ?colestipol 1 g tablet ?Commonly known as: COLESTID ?Take 2 g by mouth daily as needed. ?  ?Daily Vites tablet ?Take 1 tablet by mouth daily. ?  ?famotidine 20 MG tablet ?Commonly known as: PEPCID ?Take 20 mg by mouth daily. ?  ?metoprolol succinate 25 MG 24 hr tablet ?Commonly known as: TOPROL-XL ?Take by mouth in the morning and at bedtime. ?  ?montelukast 10 MG tablet ?Commonly known as: SINGULAIR ?Take 10 mg by mouth daily. ?  ?nitroGLYCERIN 0.4 MG SL tablet ?Commonly known as: NITROSTAT ?Place 1 tablet (0.4 mg total) under the tongue every 5 (five) minutes x 3 doses as needed for chest pain. ?  ?venlafaxine XR 75 MG 24 hr capsule ?Commonly known as: EFFEXOR-XR ?Take 1 capsule by mouth daily. ?  ? ?  ? ? Follow-up Information   ? ? Debbe Odea, MD. Schedule an appointment as soon as possible for a visit in 1 week(s).   ?Specialties: Cardiology, Radiology ?Why: tachyarrythmia ?Contact information: ?[address] ?[place] [address] ?[phone] ? ? ?  ?  ? ?  ?  ? ?  ? ?Discharge Exam: ?Filed Weights  ? 08/04/21 2340  ?Weight: 50.8 kg  ? ? ? ?Condition at discharge: good ? ?The results of significant diagnostics from this hospitalization (including imaging, microbiology, ancillary and laboratory) are listed below for reference.  ? ?Imaging Studies: ?DG Chest 2 View ? ?Result Date: 08/05/2021 ?CLINICAL DATA:  Shortness of breath EXAM: CHEST - 2 VIEW COMPARISON:  None Available. FINDINGS: Lungs are clear.  No pleural effusion or pneumothorax. The heart is normal in size. Visualized osseous structures are within normal limits. IMPRESSION: Normal chest radiographs. Electronically Signed   By: Charline Bills M.D.   On:  08/05/2021 00:29  ? ?CT ABDOMEN PELVIS W CONTRAST ? ?Result Date: 08/05/2021 ?CLINICAL DATA:  Abdominal pain, weakness, nausea/vomiting EXAM: CT ABDOMEN AND PELVIS WITH CONTRAST TECHNIQUE: Multidetector CT imaging of the abdomen and pelvis was performed using the standard protocol following bolus administration of intravenous contrast. RADIATION DOSE REDUCTION: This exam was performed according to the departmental dose-optimization program which includes automated exposure control, adjustment of the mA and/or kV according to patient size and/or use of iterative reconstruction technique. CONTRAST:  74mL OMNIPAQUE IOHEXOL 300 MG/ML  SOLN COMPARISON:  None Available. FINDINGS: Lower chest: Lung bases are clear. Hepatobiliary: Liver is within normal limits. Status post cholecystectomy. No intrahepatic ductal dilatation. Common duct measures 15 mm and smoothly tapers at the ampulla, likely postsurgical. Pancreas: Within normal limits. Spleen: Within normal limits. Adrenals/Urinary Tract: Adrenal glands are within normal limits. Subcentimeter right lower pole renal cyst. Left kidney is within normal limits. No hydronephrosis. Bladder is within normal limits. Stomach/Bowel: Stomach is within normal limits. No evidence of bowel obstruction. Appendix is not discretely visualized. Extensive left colonic diverticulosis, without convincing pericolonic inflammatory changes to suggest acute diverticulitis. Vascular/Lymphatic: No evidence of abdominal aortic aneurysm. Atherosclerotic calcifications of the  abdominal aorta and branch vessels. No suspicious abdominopelvic lymphadenopathy. Reproductive: Uterus is within normal limits. No adnexal masses. Other: No abdominopelvic ascites. Faint mesenteric stranding beneath the anterior abdominal wall, nonspecific. 9 mm coarse calcified lesion along the anterior abdominal mesentery (series 3/image 40), indeterminate. Musculoskeletal: Visualized osseous structures are within normal limits.  IMPRESSION: Extensive left colonic diverticulosis, without evidence of diverticulitis. No evidence of bowel obstruction. Appendix is not discretely visualized. Status post cholecystectomy. Electronically Signed   B

## 2021-08-07 NOTE — Progress Notes (Signed)
? ?Progress Note ? ?Patient Name: Veronica Warner ?Date of Encounter: 08/07/2021 ? ?[facility] Cardiologist: Lorine Bears, MD  ? ?Subjective  ? ?Patient seen on AM rounds. Denies any chest pain, shortness of breath, or abdominal pain. She did have increased amount of nausea yesterday and received Phenergan which dropped her blood pressure and she was then given a 500 mls bolus of IVF to assist with the drop in her blood pressure from  systolic 180 to 98. After NS bolus was administered patients blood pressure increased to 130's systolic. ? ?Inpatient Medications  ?  ?Scheduled Meds: ? aspirin EC  81 mg Oral Daily  ? atorvastatin  40 mg Oral Daily  ? cloNIDine  0.1 mg Oral BID  ? famotidine  20 mg Oral Daily  ? metoprolol tartrate  12.5 mg Oral BID  ? montelukast  10 mg Oral Daily  ? venlafaxine XR  75 mg Oral Daily  ? ?Continuous Infusions: ? ?PRN Meds: ?acetaminophen, ALPRAZolam, alum & mag hydroxide-simeth, hydrALAZINE, nitroGLYCERIN, ondansetron (ZOFRAN) IV  ? ?Vital Signs  ?  ?Vitals:  ? 08/06/21 2333 08/07/21 0008 08/07/21 0322 08/07/21 0754  ?BP: (!) 85/50 (!) 108/58 136/64 (!) 179/87  ?Pulse: 63 62 (!) 59 74  ?Resp: 16 20 20 17   ?Temp: 98.1 ?F (36.7 ?C) 98.1 ?F (36.7 ?C) 98 ?F (36.7 ?C) 97.6 ?F (36.4 ?C)  ?TempSrc: Oral Oral Oral   ?SpO2: 100% 99% 98% 100%  ?Weight:      ?Height:      ? ? ?Intake/Output Summary (Last 24 hours) at 08/07/2021 0849 ?Last data filed at 08/07/2021 0745 ?Gross per 24 hour  ?Intake 1020.36 ml  ?Output 1700 ml  ?Net -679.64 ml  ? ? ?  08/04/2021  ? 11:40 PM  ?Last 3 Weights  ?Weight (lbs) 112 lb  ?Weight (kg) 50.803 kg  ?   ? ?Telemetry  ?  ?SR, rate 70-80's, unifocal PVC's with brief episodes of bigeminy- Personally Reviewed ? ?ECG  ?  ?No recent studies completed ? ?Physical Exam  ? ?GEN: No acute distress.   ?Neck: No JVD ?Cardiac: RRR, II/VI systolic murmur that radiates into the bilateral carotids, without rubs, or gallops.  ?Respiratory: Clear to auscultation bilaterally.  Respirations are unlabored at rest on room air. ?GI: Soft, nontender, non-distended, hyperactive bowel sounds present ?MS: No edema; No deformity. ?Neuro:  Nonfocal  ?Psych: Normal affect  ? ?Labs  ?  ?High Sensitivity Troponin:   ?Recent Labs  ?Lab 08/04/21 ?2342 08/05/21 ?0115  ?TROPONINIHS 410* 338*  ?   ?Chemistry ?Recent Labs  ?Lab 08/04/21 ?2342 08/05/21 ?1449 08/06/21 ?08/08/21 08/07/21 ?08/09/21  ?NA 131* 135 134* 137  ?K 3.5 2.8* 3.6 3.9  ?CL 96* 98 99 102  ?CO2 19* 24 29 29   ?GLUCOSE 181* 160* 104* 100*  ?BUN 25* 13 16 14   ?CREATININE 1.06* 0.90 1.02* 0.91  ?CALCIUM 9.2 8.7* 8.6* 8.8*  ?MG  --   --   --  2.4  ?PROT 8.4*  --   --   --   ?ALBUMIN 3.9  --   --   --   ?AST 43*  --   --   --   ?ALT 24  --   --   --   ?ALKPHOS 91  --   --   --   ?BILITOT 1.0  --   --   --   ?GFRNONAA 58* >60 >60 >60  ?ANIONGAP 16* 13 6 6   ?  ?Lipids No  results for input(s): CHOL, TRIG, HDL, LABVLDL, LDLCALC, CHOLHDL in the last 168 hours.  ?Hematology ?Recent Labs  ?Lab 08/04/21 ?2342 08/05/21 ?2947 08/07/21 ?6546  ?WBC 15.6* 17.2* 7.4  ?RBC 5.28* 5.14* 4.44  ?HGB 15.7* 15.5* 13.2  ?HCT 45.4 45.8 40.0  ?MCV 86.0 89.1 90.1  ?MCH 29.7 30.2 29.7  ?MCHC 34.6 33.8 33.0  ?RDW 13.9 14.2 14.2  ?PLT 564* 546* 393  ? ?Thyroid  ?Recent Labs  ?Lab 08/05/21 ?0115  ?TSH 0.962  ?  ?BNPNo results for input(s): BNP, PROBNP in the last 168 hours.  ?DDimer No results for input(s): DDIMER in the last 168 hours.  ? ?Radiology  ?  ?Cardiac Studies  ?Echocardiogram completed on 08/06/2021 ?1. Left ventricular ejection fraction, by estimation, is >75%. The left  ?ventricle has hyperdynamic function. The left ventricle has no regional  ?wall motion abnormalities. There is mild left ventricular hypertrophy.  ?Indeterminate diastolic filling due to  ? E-A fusion.  ? 2. Right ventricular systolic function is normal. The right ventricular  ?size is normal.  ? 3. The mitral valve is normal in structure. Trivial mitral valve  ?regurgitation. No evidence of mitral  stenosis.  ? 4. The aortic valve has an indeterminant number of cusps. Aortic valve  ?regurgitation is not visualized. No aortic stenosis is present.  ? 5. The inferior vena cava is normal in size with greater than 50%  ?respiratory variability, suggesting right atrial pressure of 3 mmHg.  ? ?Patient Profile  ?   ?66 y.o. female with a  history of hypertension, remote alcohol use, depression, h/o pancreatitis, non-obstructive coronary artery disease with Takosubo/ stress induced cardiomyopathy in 2021, and anxiety who is being seen on 08/07/2021 for the continued evaluation of NSTEMI and tachyarrhythmia at the request of Dr. Para March.  ? ?Assessment & Plan  ?  ?NSTEMI, likely secondary to demand ischemia from hypertensive urgency, tachyarrhythmia and gastritis ?-HS troponin 410 down to 338 ?- Heparin drip discontinued this morning ?- Echocardiogram completed and revealed no wall motion abnormalities, with EF greater than 75% ?- No other ischemic work-up planned for inpatient setting as symptoms seem to be more suggestive of underlying GI pathology ?- If recurrent symptoms can continue ischemic work-up in the outpatient setting ? ?2. Hypertensive urgency- now resolved ?- Continue clonidine 0.1 mg bid ?- Can consider changing to different blood pressure medication with less negative side effects in the outpatient setting ?- Continue hydralazine 10 mg IVP PRN ?- Vital signs per unit protocol ? ?3. Tachyarrhythmia ?- Rate has been better controlled with resumption of metoprolol ?- Continue metoprolol 12.5 mg bid, may increase to 25 mg bid if needed for better rate control ?- Consider ZIO XT monitor in outpatient setting ? ?For questions or updates, please contact [facility] ?Please consult [email or web address] for contact info under  ? ?  ?   ?Signed, ?Darrion Wyszynski, NP  ?08/07/2021, 8:49 AM   ? ?

## 2021-08-10 LAB — CULTURE, BLOOD (ROUTINE X 2)
Culture: NO GROWTH
Culture: NO GROWTH
Special Requests: ADEQUATE
Special Requests: ADEQUATE

## 2021-08-12 ENCOUNTER — Ambulatory Visit (INDEPENDENT_AMBULATORY_CARE_PROVIDER_SITE_OTHER): Payer: Medicare Other | Admitting: Medical

## 2021-08-12 ENCOUNTER — Encounter: Payer: Self-pay | Admitting: Medical

## 2021-08-12 ENCOUNTER — Encounter: Payer: Self-pay | Admitting: Cardiovascular Disease

## 2021-08-12 ENCOUNTER — Ambulatory Visit (INDEPENDENT_AMBULATORY_CARE_PROVIDER_SITE_OTHER): Payer: Medicare Other

## 2021-08-12 VITALS — BP 130/70 | HR 62 | Ht 66.0 in | Wt 120.1 lb

## 2021-08-12 DIAGNOSIS — R072 Precordial pain: Secondary | ICD-10-CM

## 2021-08-12 DIAGNOSIS — E782 Mixed hyperlipidemia: Secondary | ICD-10-CM

## 2021-08-12 DIAGNOSIS — R7989 Other specified abnormal findings of blood chemistry: Secondary | ICD-10-CM

## 2021-08-12 DIAGNOSIS — R000 Tachycardia, unspecified: Secondary | ICD-10-CM

## 2021-08-12 DIAGNOSIS — I10 Essential (primary) hypertension: Secondary | ICD-10-CM | POA: Diagnosis not present

## 2021-08-12 DIAGNOSIS — R778 Other specified abnormalities of plasma proteins: Secondary | ICD-10-CM

## 2021-08-12 DIAGNOSIS — I214 Non-ST elevation (NSTEMI) myocardial infarction: Secondary | ICD-10-CM | POA: Diagnosis not present

## 2021-08-12 DIAGNOSIS — I2510 Atherosclerotic heart disease of native coronary artery without angina pectoris: Secondary | ICD-10-CM

## 2021-08-12 MED ORDER — METOPROLOL TARTRATE 100 MG PO TABS: ORAL_TABLET | ORAL | 0 refills | Status: DC

## 2021-08-12 NOTE — Patient Instructions (Addendum)
Medication Instructions:  - Your physician recommends that you continue on your current medications as directed. Please refer to the Current Medication list given to you today.  *If you need a refill on your cardiac medications before your next appointment, please call your pharmacy*   Lab Work: - none ordered  If you have labs (blood work) drawn today and your tests are completely normal, you will receive your results only by: [place] (if you have MyChart) OR A paper copy in the mail If you have any lab test that is abnormal or we need to change your treatment, we will call you to review the results.   Testing/Procedures:  1) Cardiac CT: - Your physician has requested that you have cardiac CT. Cardiac computed tomography (CT) is a painless test that uses an x-ray machine to take clear, detailed pictures of your heart.     Your cardiac CT will be scheduled at:   [facility] [address] [place], [place] [address] [phone]   Please arrive 15 mins early for check-in and test prep.   Please follow these instructions carefully (unless otherwise directed):   On the Night Before the Test: Be sure to Drink plenty of water. Do not consume any caffeinated/decaffeinated beverages or chocolate 12 hours prior to your test. Do not take any antihistamines 12 hours prior to your test.   On the Day of the Test: Drink plenty of water until 1 hour prior to the test. Do not eat any food 4 hours prior to the test. You may take your regular medications prior to the test.  Take metoprolol (Lopressor) 100 mg two hours prior to test (this is in addition to your daily metoprolol succinate that you take. FEMALES- please wear underwire-free bra if available, avoid dresses & tight clothing        After the Test: Drink plenty of water. After receiving IV contrast, you may experience a mild flushed feeling. This is normal. On  occasion, you may experience a mild rash up to 24 hours after the test. This is not dangerous. If this occurs, you can take Benadryl 25 mg and increase your fluid intake. If you experience trouble breathing, this can be serious. If it is severe call 911 IMMEDIATELY. If it is mild, please call our office.  We will call to schedule your test 2-4 weeks out understanding that some insurance companies will need an authorization prior to the service being performed.   For non-scheduling related questions, please contact the cardiac imaging nurse navigator should you have any questions/concerns: Marchia Bond, Cardiac Imaging Nurse Navigator Gordy Clement, Cardiac Imaging Nurse Navigator [facility] Direct Office Dial: [phone]   For scheduling needs, including cancellations and rescheduling, please call [place], [phone].     2) Heart Monitor: (Do not place until after you have your Cardiac CT on 6/1).   Length of wear: 14 days This will be mailed to your home address within 3-5 business days. If you do not receive this after 5 business days, then please call our office at [phone] so we can follow up in this for you.   Your physician has recommended that you wear a Zio monitor.   This monitor is a medical device that records the heart's electrical activity. Doctors most often use these monitors to diagnose arrhythmias. Arrhythmias are problems with the speed or rhythm of the heartbeat. The monitor is a small device applied to your chest.  You can wear one while you do your normal daily activities. While wearing this monitor if you have any symptoms to push the button and record what you felt. Once you have worn this monitor for the period of time provider prescribed (Usually 14 days), you will return the monitor device in the postage paid box. Once it is returned they will download the data collected and provide [place] with a report which the provider will then  review and we will call you with those results. Important tips:  Avoid showering during the first 24 hours of wearing the monitor. Avoid excessive sweating to help maximize wear time. Do not submerge the device, no hot tubs, and no swimming pools. Keep any lotions or oils away from the patch. After 24 hours you may shower with the patch on. Take brief showers with your back facing the shower head.  Do not remove patch once it has been placed because that will interrupt data and decrease adhesive wear time. Push the button when you have any symptoms and write down what you were feeling. Once you have completed wearing your monitor, remove and place into box which has postage paid and place in your outgoing mailbox.  If for some reason you have misplaced your box then call our office and we can provide another box and/or mail it off for you.     Follow-Up: At [facility], you and your health needs are our priority.  As part of our continuing mission to provide you with exceptional heart care, we have created designated Provider Care Teams.  These Care Teams include your primary Cardiologist (physician) and Advanced Practice Providers (APPs -  Physician Assistants and Nurse Practitioners) who all work together to provide you with the care you need, when you need it.  We recommend signing up for the patient portal called "MyChart".  Sign up information is provided on this After Visit Summary.  MyChart is used to connect with patients for Virtual Visits (Telemedicine).  Patients are able to view lab/test results, encounter notes, upcoming appointments, etc.  Non-urgent messages can be sent to your provider as well.   To learn more about what you can do with MyChart, go to [email or web address].    Your next appointment:   2 month(s)  The format for your next appointment:   In Person  Provider:   You may see Kathlyn Sacramento, MD or one of the following Advanced Practice Providers on your  designated Care Team:    Cadence Kathlen Mody, [place]    Other Instructions  Cardiac CT Angiogram A cardiac CT angiogram is a procedure to look at the heart and the area around the heart. It may be done to help find the cause of chest pains or other symptoms of heart disease. During this procedure, a substance called contrast dye is injected into the blood vessels in the area to be checked. A large X-ray machine, called a CT scanner, then takes detailed pictures of the heart and the surrounding area. The procedure is also sometimes called a coronary CT angiogram, coronary artery scanning, or CTA. A cardiac CT angiogram allows the health care provider to see how well blood is flowing to and from the heart. The health care provider will be able to see if there are any problems, such as: Blockage or narrowing of the coronary arteries in the heart. Fluid around the heart. Signs of weakness or disease in the muscles, valves, and tissues of the heart. Tell a health care provider  about: Any allergies you have. This is especially important if you have had a previous allergic reaction to contrast dye. All medicines you are taking, including vitamins, herbs, eye drops, creams, and over-the-counter medicines. Any blood disorders you have. Any surgeries you have had. Any medical conditions you have. Whether you are pregnant or may be pregnant. Any anxiety disorders, chronic pain, or other conditions you have that may increase your stress or prevent you from lying still. What are the risks? Generally, this is a safe procedure. However, problems may occur, including: Bleeding. Infection. Allergic reactions to medicines or dyes. Damage to other structures or organs. Kidney damage from the contrast dye that is used. Increased risk of cancer from radiation exposure. This risk is low. Talk with your health care provider about: The risks and benefits of testing. How you can receive the lowest dose of  radiation. What happens before the procedure? Wear comfortable clothing and remove any jewelry, glasses, dentures, and hearing aids. Follow instructions from your health care provider about eating and drinking. This may include: For 12 hours before the procedure -- avoid caffeine. This includes tea, coffee, soda, energy drinks, and diet pills. Drink plenty of water or other fluids that do not have caffeine in them. Being well hydrated can prevent complications. For 4-6 hours before the procedure -- stop eating and drinking. The contrast dye can cause nausea, but this is less likely if your stomach is empty. Ask your health care provider about changing or stopping your regular medicines. This is especially important if you are taking diabetes medicines, blood thinners, or medicines to treat problems with erections (erectile dysfunction). What happens during the procedure?  Hair on your chest may need to be removed so that small sticky patches called electrodes can be placed on your chest. These will transmit information that helps to monitor your heart during the procedure. An IV will be inserted into one of your veins. You might be given a medicine to control your heart rate during the procedure. This will help to ensure that good images are obtained. You will be asked to lie on an exam table. This table will slide in and out of the CT machine during the procedure. Contrast dye will be injected into the IV. You might feel warm, or you may get a metallic taste in your mouth. You will be given a medicine called nitroglycerin. This will relax or dilate the arteries in your heart. The table that you are lying on will move into the CT machine tunnel for the scan. The person running the machine will give you instructions while the scans are being done. You may be asked to: Keep your arms above your head. Hold your breath. Stay very still, even if the table is moving. When the scanning is complete, you  will be moved out of the machine. The IV will be removed. The procedure may vary among health care providers and hospitals. What can I expect after the procedure? After your procedure, it is common to have: A metallic taste in your mouth from the contrast dye. A feeling of warmth. A headache from the nitroglycerin. Follow these instructions at home: Take over-the-counter and prescription medicines only as told by your health care provider. If you are told, drink enough fluid to keep your urine pale yellow. This will help to flush the contrast dye out of your body. Most people can return to their normal activities right after the procedure. Ask your health care provider what activities are  safe for you. It is up to you to get the results of your procedure. Ask your health care provider, or the department that is doing the procedure, when your results will be ready. Keep all follow-up visits as told by your health care provider. This is important. Contact a health care provider if: You have any symptoms of allergy to the contrast dye. These include: Shortness of breath. Rash or hives. A racing heartbeat. Summary A cardiac CT angiogram is a procedure to look at the heart and the area around the heart. It may be done to help find the cause of chest pains or other symptoms of heart disease. During this procedure, a large X-ray machine, called a CT scanner, takes detailed pictures of the heart and the surrounding area after a contrast dye has been injected into blood vessels in the area. Ask your health care provider about changing or stopping your regular medicines before the procedure. This is especially important if you are taking diabetes medicines, blood thinners, or medicines to treat erectile dysfunction. If you are told, drink enough fluid to keep your urine pale yellow. This will help to flush the contrast dye out of your body. This information is not intended to replace advice given to  you by your health care provider. Make sure you discuss any questions you have with your health care provider. Document Revised: 11/21/2020 Document Reviewed: 11/03/2018 Elsevier Patient Education  [place]

## 2021-08-12 NOTE — Progress Notes (Signed)
Cardiology Office Note:    Date:  08/12/2021   ID:  Veronica Warner, DOB [birth year], MRN [id number]  PCP:  Merryl Hacker No  [facility] Cardiologist:  Dr. [facility] Electrophysiologist:  None   Referring MD: Fritzi Mandes, MD   Chief Complaint: hospital follow-up  History of Present Illness:    Veronica Warner is a 66 y.o. 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   H/o LHC in [place] in 2021 showing nonobstructive CAD. Patient does not believe she has CAD.  She was recently hospitalized for abdominal pain and weakness found to have gastroenteritis. BP was severely elevated and heart rate was fast. There was a question of afib in transit to to the ER. Troponin was elevated to 410 and was started on IV heparin. Echo showed normal LVEF. BP meds were controlled. Plan was for outpatient stress vs cardiac CTA.   Today, the patient reports she stayed in town for hospital follow-up. She is from [place], but is wondering about establishing care here. She is currently staying with a friend. She reports episodes of N/V/D with dehydration, elevated BP/HR since her gallbladder was removed in 2021.  No chest pain, SOB, LLE, orthopnea, pnd. BP today is good. She has BP cuff in [place]. We discussed coronary artery disease and recent admission in depth. She has h/o alcohol use, has been sober for 19 years. She vapes daily and smokes marijuana. She has anxiety which likely affects BP and HR, possibly from the recent move.   Past Medical History:  Diagnosis Date   Coronary artery disease    Hypertension    Metabolic acidosis, increased anion gap (IAG) 08/05/2021   Pancreatitis     Past Surgical History:  Procedure Laterality Date   CARDIAC CATHETERIZATION     2021 no stents   CHOLECYSTECTOMY       Current Medications: Current Meds  Medication Sig   albuterol (VENTOLIN HFA) 108 (90 Base) MCG/ACT inhaler Inhale 2 puffs into the lungs every 6 (six) hours as needed for wheezing or shortness of breath.   ALPRAZolam (XANAX) 1 MG tablet Take 1 tablet (1 mg total) by mouth 2 (two) times daily as needed for anxiety.   ascorbic acid (VITAMIN C) 500 MG tablet Take 4,000 mg by mouth in the morning.   aspirin EC 81 MG tablet Take 81 mg by mouth daily. Swallow whole.   atorvastatin (LIPITOR) 40 MG tablet Take 1 tablet (40 mg total) by mouth daily.   Calcium-Magnesium 600-300 MG/6.8GM POWD Take 1 packet by mouth daily.   cloNIDine (CATAPRES) 0.1 MG tablet Take 0.1 mg by mouth 2 (two) times daily.   colestipol (COLESTID) 1 g tablet Take 2 g by mouth daily as needed.   famotidine (PEPCID) 20 MG tablet Take 20 mg by mouth daily.   metoprolol succinate (TOPROL-XL) 25 MG 24 hr tablet Take by mouth in the morning and at bedtime.   montelukast (SINGULAIR) 10 MG tablet Take 10 mg by mouth daily.   Multiple Vitamin (DAILY VITES) tablet Take 1 tablet by mouth daily.   nitroGLYCERIN (NITROSTAT) 0.4 MG SL tablet Place 1 tablet (0.4 mg total) under the tongue every 5 (five) minutes x 3 doses as needed for chest pain.   venlafaxine XR (EFFEXOR-XR) 75 MG 24 hr capsule Take 1 capsule by mouth  daily.     Allergies:   Ondansetron hcl and Short ragweed pollen ext   Social History   Socioeconomic History   Marital status: Married    Spouse name: Not on file   Number of children: Not on file   Years of education: Not on file   Highest education level: Not on file  Occupational History   Not on file  Tobacco Use   Smoking status: Never   Smokeless tobacco: Never  Vaping Use   Vaping Use: Every day  Substance and Sexual Activity   Alcohol use: Not Currently   Drug use: Yes    Types: Marijuana    Comment: Delta 8 vaping   Sexual activity: Not on file  Other Topics Concern   Not on file  Social  History Narrative   Not on file   Social Determinants of Health   Financial Resource Strain: Not on file  Food Insecurity: Not on file  Transportation Needs: Not on file  Physical Activity: Not on file  Stress: Not on file  Social Connections: Not on file     Family History: The patient's family history includes Cancer in her mother; Factor V Leiden deficiency in an other family member; Hypertension in her brother and mother; Hypothyroidism in her mother; Stroke in her maternal grandmother; Thyroid disease in her brother.  ROS:   Please see the history of present illness.     All other systems reviewed and are negative.  EKGs/Labs/Other Studies Reviewed:    The following studies were reviewed today:  Echo 08/06/21   1. Left ventricular ejection fraction, by estimation, is >75%. The left  ventricle has hyperdynamic function. The left ventricle has no regional  wall motion abnormalities. There is mild left ventricular hypertrophy.  Indeterminate diastolic filling due to   E-A fusion.   2. Right ventricular systolic function is normal. The right ventricular  size is normal.   3. The mitral valve is normal in structure. Trivial mitral valve  regurgitation. No evidence of mitral stenosis.   4. The aortic valve has an indeterminant number of cusps. Aortic valve  regurgitation is not visualized. No aortic stenosis is present.   5. The inferior vena cava is normal in size with greater than 50%  respiratory variability, suggesting right atrial pressure of 3 mmHg.   EKG:  EKG is  ordered today.  The ekg ordered today demonstrates NSR 62bpm, nonspecific ST changes  Recent Labs: 08/04/2021: ALT 24 08/05/2021: TSH 0.962 08/07/2021: BUN 14; Creatinine, Ser 0.91; Hemoglobin 13.2; Magnesium 2.4; Platelets 393; Potassium 3.9; Sodium 137  Recent Lipid Panel No results found for: CHOL, TRIG, HDL, CHOLHDL, VLDL, LDLCALC, LDLDIRECT  Physical Exam:    VS:  BP 130/70 (BP Location: Left Arm,  Patient Position: Sitting, Cuff Size: Normal)   Pulse 62   Ht 5\' 6"  (1.676 m)   Wt 120 lb 2 oz (54.5 kg)   SpO2 98%   BMI 19.39 kg/m     Wt Readings from Last 3 Encounters:  08/12/21 120 lb 2 oz (54.5 kg)  08/04/21 112 lb (50.8 kg)     GEN:  Well nourished, well developed in no acute distress HEENT: Normal NECK: No JVD; No carotid bruits LYMPHATICS: No lymphadenopathy CARDIAC: RRR, no murmurs, rubs, gallops RESPIRATORY:  Clear to auscultation without rales, wheezing or rhonchi  ABDOMEN: Soft, non-tender, non-distended MUSCULOSKELETAL:  No edema; No deformity  SKIN: Warm and dry NEUROLOGIC:  Alert and oriented x 3 PSYCHIATRIC:  Normal affect  ASSESSMENT:    1. Coronary artery disease involving native heart without angina pectoris, unspecified vessel or lesion type   2. Elevated troponin   3. Essential hypertension   4. Tachyarrhythmia   5. Non-ST elevation (NSTEMI) myocardial infarction ([place])   6. Precordial pain   7. Hyperlipidemia, mixed    PLAN:    In order of problems listed above:  Elevated troponin  Nonobstructive CAD History of LHC in [place] with nonobstructive CAD. Recent admission for gastroenteritis found to have elevated troponin suspected demand ischemia due to HTN and tachyarrhythmia. She was treated with IV heparin.  Echo showed LVEF >75%, mild LVH, trivial MR. Patient apparently has had similar episodes for the last few years after gallbladder was removed in 2021. Anxiety is likely contributing to severity of cardiac issues. She denies anginal symptoms today. She is very active. I will order a Cardiac CTA. Continue Aspirin 81mg  daily, Lipitor 40mg  daily and Toprol 25mg  BID.  HTN BP today is good. Continue clonidine 0.1mg  BID and Toprol 25mg  daily. Recommended she check BP at home for a couple weeks to see if we need medication changes at follow-up.    Tachyarrhythmia Tachyarrhythmia in the setting of gastroenteritis. Question of Afib from EMS in transit to  the hospital. No evidence of afib thus far. EKG shows NSR with 62bpm. We will get a 2 week heart monitor.   HLD 141 06/2019. Started on Lipitor 40mg  daily. Needs repeat labs.   Disposition: Follow up in 2 month(s) with MD/APP     Signed, Carlethia Mesquita Ninfa Meeker, PA-C  08/12/2021 3:11 PM    [facility]

## 2021-08-21 ENCOUNTER — Encounter (HOSPITAL_COMMUNITY): Payer: Self-pay

## 2021-08-21 ENCOUNTER — Telehealth (HOSPITAL_COMMUNITY): Payer: Self-pay | Admitting: Emergency Medicine

## 2021-08-21 ENCOUNTER — Telehealth (HOSPITAL_COMMUNITY): Payer: Self-pay | Admitting: *Deleted

## 2021-08-21 NOTE — Telephone Encounter (Signed)
Attempted to call patient regarding upcoming cardiac CT appointment. °Left message on voicemail with name and callback number °Torrin Crihfield RN Navigator Cardiac Imaging °[facility] °[phone] Office °[phone] Cell ° °

## 2021-08-21 NOTE — Telephone Encounter (Signed)
Patient returning call regarding upcoming cardiac imaging study; pt verbalizes understanding of appt date/time, parking situation and where to check in, pre-test NPO status and medications ordered, and verified current allergies; name and call back number provided for further questions should they arise  Veronica Brick RN Navigator Cardiac Imaging [facility] [phone] office [phone] cell  Patient to hold her daily metoprolol succinate and take 100mg  metoprolol tartrate two hours prior to her cardiac CT scan.

## 2021-08-22 ENCOUNTER — Ambulatory Visit
Admission: RE | Admit: 2021-08-22 | Discharge: 2021-08-22 | Disposition: A | Discharge status: Home / Self Care | Payer: Medicare Other | Source: Ambulatory Visit | Attending: Medical | Admitting: Medical

## 2021-08-22 DIAGNOSIS — R778 Other specified abnormalities of plasma proteins: Secondary | ICD-10-CM | POA: Diagnosis not present

## 2021-08-22 DIAGNOSIS — I2510 Atherosclerotic heart disease of native coronary artery without angina pectoris: Secondary | ICD-10-CM | POA: Diagnosis not present

## 2021-08-22 DIAGNOSIS — I214 Non-ST elevation (NSTEMI) myocardial infarction: Secondary | ICD-10-CM | POA: Insufficient documentation

## 2021-08-22 DIAGNOSIS — R079 Chest pain, unspecified: Secondary | ICD-10-CM | POA: Diagnosis not present

## 2021-08-22 DIAGNOSIS — R072 Precordial pain: Secondary | ICD-10-CM | POA: Diagnosis present

## 2021-08-22 MED ORDER — IOHEXOL 350 MG/ML SOLN
75.0000 mL | Freq: Once | INTRAVENOUS | Status: AC | PRN
  Administered 2021-08-22: 75 mL via INTRAVENOUS

## 2021-08-22 MED ORDER — NITROGLYCERIN 0.4 MG SL SUBL
0.8000 mg | SUBLINGUAL_TABLET | Freq: Once | SUBLINGUAL | Status: AC
Start: 2021-08-22 — End: 2021-08-22
  Administered 2021-08-22: 0.8 mg via SUBLINGUAL

## 2021-08-22 NOTE — Progress Notes (Signed)
Patient tolerated procedure well. Ambulate w/o difficulty. Denies light headedness or being dizzy. Drinking water provided. Encouraged to drink extra water today and reasoning explained. Verbalized understanding. All questions answered. ABC intact. No further needs. Discharge from procedure area w/o issues.

## 2021-08-26 ENCOUNTER — Ambulatory Visit: Attending: Family Medicine | Primary: Family Medicine

## 2021-08-27 ENCOUNTER — Telehealth: Payer: Self-pay

## 2021-08-27 DIAGNOSIS — R000 Tachycardia, unspecified: Secondary | ICD-10-CM

## 2021-08-27 NOTE — Telephone Encounter (Signed)
Left a VM requesting a call back.

## 2021-08-27 NOTE — Telephone Encounter (Signed)
-----   Message from Rowan, PA-C sent at 08/23/2021  7:38 AM EDT ----- Cardiac CTA showed calcium score of 101, which is 81st percentile for age and sex matched, mild nonobstructive CAD. Plan to continue with medical management.

## 2021-09-17 ENCOUNTER — Other Ambulatory Visit: Payer: Self-pay

## 2021-09-17 MED ORDER — ATORVASTATIN CALCIUM 40 MG PO TABS: 40.0000 mg | ORAL_TABLET | Freq: Every day | ORAL | 0 refills | Status: DC

## 2021-09-25 ENCOUNTER — Inpatient Hospital Stay: Admit: 2021-09-25 | Payer: MEDICARE | Primary: Student in an Organized Health Care Education/Training Program

## 2021-09-25 ENCOUNTER — Inpatient Hospital Stay: Payer: MEDICARE

## 2021-09-25 DIAGNOSIS — R9389 Abnormal findings on diagnostic imaging of other specified body structures: Secondary | ICD-10-CM

## 2021-09-30 ENCOUNTER — Telehealth: Payer: Self-pay

## 2021-09-30 NOTE — Telephone Encounter (Addendum)
Pt called and reported that one of her breast implants is leaking and was found during at CT that she had to have for a heart issue. She stated that she's gone to the ER twice for the issue because she believes the ruptured implant has caused other health issues. She wanted to know what she should do about the ruptured implant. I adv pt that she would have to make an appt to be examined by a surgeon. She asked if I knew the doctors and if any of them were good. I adv her that I cannot give a personal opinion of the providers skill. I then adv her that if she wanted the implant looked at she would need to talk to the front desk and make an appt. She stated that she could not make an appt with a doctor that she knew nothing about. I asked if she had a primary care physician and she said she was fired from her last one and has since made an appt with another one that is for 7/31. She said she didn't want to see a primary but wanted to see plastic surgeon. I adv pt that she needs an appt and that we cannot adv her of anything to do over the phone without being examined. Pt stated that she would call back.

## 2021-10-01 ENCOUNTER — Ambulatory Visit: Payer: Medicare Other | Admitting: Cardiovascular Disease

## 2021-10-01 ENCOUNTER — Telehealth: Payer: Self-pay

## 2021-10-01 MED ORDER — METOPROLOL SUCCINATE ER 50 MG PO TB24: 50.0000 mg | ORAL_TABLET | Freq: Two times a day (BID) | ORAL | 1 refills | Status: DC

## 2021-10-01 NOTE — Telephone Encounter (Signed)
-----   Message from Cadence David Stall, PA-C sent at 09/30/2021  1:40 PM EDT ----- Heart monitor showed predominately normal rhythm with 26 runs of fast heart rate and 5% PVCs. She is on Toprol 25mg  daily. Can we increase to 50mg  daily.

## 2021-10-01 NOTE — Telephone Encounter (Signed)
Pt is returning call.  

## 2021-10-01 NOTE — Telephone Encounter (Signed)
Called the patient to review monitor results. Lmtcb.

## 2021-10-01 NOTE — Telephone Encounter (Signed)
Patient made aware of cardiac monitor results and Cadence's recommendation. Pt is agreeable with increasing toprol to 50 mg daily.  Pt is asking if Cadence will refill her Effexor as a one time courtesy. She is almost out of the medication. Pt sts that she was fired by her pcp and they will not refill her medications. She is scheduled establish with a new pcp at the end of the month. She is asking for 15 tabs. Advised the patient that will d not prescribe anti-depressants. Pt sts that Cadence is aware of all that is going on with her and would like the mad fwd to Cadence.  Adv the patient that I will fwd the msg as requested.

## 2021-10-02 ENCOUNTER — Encounter

## 2021-10-02 MED ORDER — VENLAFAXINE HCL ER 75 MG PO CP24: 75.0000 mg | ORAL_CAPSULE | Freq: Every day | ORAL | 0 refills | Status: DC

## 2021-10-02 NOTE — Telephone Encounter (Signed)
Furth, Cadence H, PA-C  You 14 minutes ago (8:12 AM)    That's fine to do a one time refill.      Refill for Effexor #15 R-0 has been sent to the patients pharmacy. Future refills will need to be sent tot the pt PCP.

## 2021-10-02 NOTE — Addendum Note (Signed)
Addended by: Jarvis Newcomer on: 10/02/2021 08:30 AM   Modules accepted: Orders

## 2021-10-07 ENCOUNTER — Telehealth: Payer: Self-pay | Admitting: Cardiovascular Disease

## 2021-10-07 ENCOUNTER — Telehealth: Payer: Self-pay | Admitting: Medical

## 2021-10-07 NOTE — Telephone Encounter (Signed)
   Pre-operative Risk Assessment    Patient Name: Veronica Warner  DOB: [birth year] MRN: [id number]      Request for Surgical Clearance    Procedure:   REMOVAL AND REPLACEMENT OF BILATERAL IMPLANTS, RT BREAST IMPLANT RUPTURED  Date of Surgery:  Clearance 11/11/21                               Surgeon:  DR Effie Shy Surgeon's Group or Practice Name:  [facility] Phone number:  [phone] Fax number:  [phone]   Type of Clearance Requested:   - Medical    Type of Anesthesia:  General    Additional requests/questions:    SignedDalia Heading   10/07/2021, 1:04 PM

## 2021-10-07 NOTE — Telephone Encounter (Signed)
   Pre-operative Risk Assessment    Patient Name: Veronica Warner  DOB: 06-21-1955 MRN: [id number]      Request for Surgical Clearance    Procedure:   Removal and replacement of bilateral breast implant   Date of Surgery:  Clearance 11-11-21                                  Surgeon:  Dr Effie Shy Surgeon's Group or Practice Name:   Phone number:  [phone] Fax number:  [phone]   Type of Clearance Requested:   - Medical  and will need most recent EKG   Type of Anesthesia:  General - surgery is scheduled fore 2 hours   Additional requests/questions:    Signed, Laurence Ferrari   10/07/2021, 11:21 AM

## 2021-10-08 NOTE — Telephone Encounter (Signed)
   Name: Veronica Warner  DOB: 1955-06-11  MRN: [id number]   Primary Cardiologist: None  Chart reviewed as part of pre-operative protocol coverage. Patient was contacted 10/08/2021 in reference to pre-operative risk assessment for pending surgery as outlined below.  Veronica Warner was last seen on 08/12/21 by Cadence Fransico Michael, [facility] for hospital follow up. Follow up Coronary CT without obstructive CAD.   Therefore, based on ACC/AHA guidelines, the patient would be at acceptable risk for the planned procedure without further cardiovascular testing.   I will route this recommendation to the requesting party via Epic fax function and remove from pre-op pool. Please call with questions.  [place], [place] 10/08/2021, 4:32 PM

## 2021-10-14 ENCOUNTER — Ambulatory Visit (INDEPENDENT_AMBULATORY_CARE_PROVIDER_SITE_OTHER): Payer: Medicare Other | Admitting: Medical

## 2021-10-14 ENCOUNTER — Encounter: Payer: Self-pay | Admitting: Medical

## 2021-10-14 VITALS — BP 182/80 | HR 54 | Ht 66.25 in | Wt 121.2 lb

## 2021-10-14 DIAGNOSIS — E782 Mixed hyperlipidemia: Secondary | ICD-10-CM | POA: Diagnosis not present

## 2021-10-14 DIAGNOSIS — I10 Essential (primary) hypertension: Secondary | ICD-10-CM | POA: Diagnosis not present

## 2021-10-14 DIAGNOSIS — I2510 Atherosclerotic heart disease of native coronary artery without angina pectoris: Secondary | ICD-10-CM | POA: Diagnosis not present

## 2021-10-14 DIAGNOSIS — I214 Non-ST elevation (NSTEMI) myocardial infarction: Secondary | ICD-10-CM

## 2021-10-14 MED ORDER — ATORVASTATIN CALCIUM 40 MG PO TABS
40.0000 mg | ORAL_TABLET | Freq: Every day | ORAL | 3 refills | Status: AC
Start: 2021-10-14 — End: ?

## 2021-10-14 NOTE — Progress Notes (Signed)
Cardiology Office Note:    Date:  10/14/2021   ID:  Veronica Warner, DOB 02/07/1956, MRN [id number]  PCP:  Pcp, No  [facility] Cardiologist:  None  [facility] Electrophysiologist:  None   Referring MD: No ref. provider found   Chief Complaint: 2 month follow-up  History of Present Illness:    Veronica Warner is a 65 y.o. 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    H/o LHC in [place] in 2021 showing nonobstructive CAD. Patient does not believe she has CAD.   She was recently hospitalized for abdominal pain and weakness found to have gastroenteritis. BP was severely elevated and heart rate was fast. There was a question of afib in transit to to the ER. Troponin was elevated to 410 and was started on IV heparin. Echo showed LVEF>75%. BP meds were controlled. Plan was for outpatient stress vs cardiac CTA.   Last seen 08/12/21 for hospital follow-up. Cardiac CTA and heart monitor were ordered. This showed coronary calcium score of 101, 81st percentile for age and sex matched, 25% mild LAD stenosis, overall mild non-obstructive CAD. Heart monitor showed NSR, 1 run NSVT lasting 4 beats, 26 runs of SVT, longest 18 beats, occasional PVCs.   Today, the patient reports her breast implant has been leaking. She is unsure how long it has been leaking. She has surgery in late August to take it out. She has a lot of concerns of long term affects. BP is high today, says it's lower at home. She suspects high BP is from stress. No chest pain or SOB. No LLE, orthopnea, or pnd. Patient feels PVCs at times, worse with stress. She feels overall tired. Re-check BP 170/70.   Past Medical History:  Diagnosis Date   Coronary artery disease    Hypertension    Metabolic acidosis, increased anion gap (IAG)  08/05/2021   Pancreatitis     Past Surgical History:  Procedure Laterality Date   CARDIAC CATHETERIZATION     2021 no stents   CHOLECYSTECTOMY      Current Medications: Current Meds  Medication Sig   albuterol (VENTOLIN HFA) 108 (90 Base) MCG/ACT inhaler Inhale 2 puffs into the lungs every 6 (six) hours as needed for wheezing or shortness of breath.   ALPRAZolam (XANAX) 1 MG tablet Take 1 tablet (1 mg total) by mouth 2 (two) times daily as needed for anxiety.   ascorbic acid (VITAMIN C) 500 MG tablet Take 4,000 mg by mouth in the morning.   aspirin EC 81 MG tablet Take 81 mg by mouth daily. Swallow whole.   cloNIDine (CATAPRES) 0.1 MG tablet Take 0.1 mg by mouth 2 (two) times daily.   colestipol (COLESTID) 1 g tablet Take 2 g by mouth daily as needed.   famotidine (PEPCID) 20 MG tablet Take 20 mg by mouth daily.   metoprolol succinate (TOPROL-XL) 50 MG 24 hr tablet Take 1 tablet (50 mg total) by mouth in the morning and at bedtime.   montelukast (SINGULAIR) 10 MG tablet Take 10 mg by mouth daily.   Multiple Minerals (CALCIUM/MAGNESIUM/ZINC PO) Take 1 tablet by mouth daily.   Multiple Vitamin (DAILY VITES) tablet Take 1 tablet by mouth daily.   nitroGLYCERIN (NITROSTAT) 0.4 MG SL tablet Place 1 tablet (0.4 mg total) under the tongue every 5 (five)  minutes x 3 doses as needed for chest pain.   venlafaxine XR (EFFEXOR-XR) 75 MG 24 hr capsule Take 1 capsule (75 mg total) by mouth daily. Future refills should be sent to PCP   [DISCONTINUED] atorvastatin (LIPITOR) 40 MG tablet Take 1 tablet (40 mg total) by mouth daily.     Allergies:   Ondansetron hcl and Short ragweed pollen ext   Social History   Socioeconomic History   Marital status: Married    Spouse name: Not on file   Number of children: Not on file   Years of education: Not on file   Highest education level: Not on file  Occupational History   Not on file  Tobacco Use   Smoking status: Never   Smokeless tobacco: Never   Vaping Use   Vaping Use: Every day  Substance and Sexual Activity   Alcohol use: Not Currently   Drug use: Yes    Types: Marijuana    Comment: Delta 8 vaping   Sexual activity: Not on file  Other Topics Concern   Not on file  Social History Narrative   Not on file   Social Determinants of Health   Financial Resource Strain: Not on file  Food Insecurity: Not on file  Transportation Needs: Not on file  Physical Activity: Not on file  Stress: Not on file  Social Connections: Not on file     Family History: The patient's family history includes Cancer in her mother; Factor V Leiden deficiency in an other family member; Hypertension in her brother and mother; Hypothyroidism in her mother; Stroke in her maternal grandmother; Thyroid disease in her brother.  ROS:   Please see the history of present illness.     All other systems reviewed and are negative.  EKGs/Labs/Other Studies Reviewed:    The following studies were reviewed today:  Heart monitor 07/2021 Patch Wear Time:  11 days and 0 hours (2023-06-06T10:10:57-0400 to 2023-06-17T10:27:56-0400)   Patient had a min HR of 48 bpm, max HR of 200 bpm, and avg HR of 71 bpm. Predominant underlying rhythm was Sinus Rhythm.  1 run of Ventricular Tachycardia occurred lasting 4 beats with a max rate of 162 bpm (avg 136 bpm).  26 Supraventricular Tachycardia runs occurred, the run with the fastest interval lasting 18 beats with a max rate of 200 bpm (avg 166 bpm); the run with the fastest interval was also the longest. Occasional PVCs with a burden of 5%.  Cardiac CTA 08/2021 IMPRESSION: 1. Coronary calcium score of 101. This was 81st percentile for age and sex matched control.   2. Normal coronary origin with right dominance.   3. Mild proximal LAD stenosis (25%).   4. CAD-RADS 2. Mild non-obstructive CAD (25-49%). Consider non-atherosclerotic causes of chest pain. Consider preventive therapy and risk factor modification.     EKG:  EKG is  ordered today.  The ekg ordered today demonstrates SB 54bpm, nonspecific T wave changes III  Recent Labs: 08/04/2021: ALT 24 08/05/2021: TSH 0.962 08/07/2021: BUN 14; Creatinine, Ser 0.91; Hemoglobin 13.2; Magnesium 2.4; Platelets 393; Potassium 3.9; Sodium 137  Recent Lipid Panel No results found for: "CHOL", "TRIG", "HDL", "CHOLHDL", "VLDL", "LDLCALC", "LDLDIRECT"   Physical Exam:    VS:  BP (!) 182/80 (BP Location: Right Arm, Patient Position: Sitting, Cuff Size: Normal)   Pulse (!) 54   Ht 5' 6.25" (1.683 m)   Wt 121 lb 3.2 oz (55 kg)   SpO2 98%   BMI 19.41 kg/m  Wt Readings from Last 3 Encounters:  10/14/21 121 lb 3.2 oz (55 kg)  08/12/21 120 lb 2 oz (54.5 kg)  08/04/21 112 lb (50.8 kg)     GEN:  Well nourished, well developed in no acute distress HEENT: Normal NECK: No JVD; No carotid bruits LYMPHATICS: No lymphadenopathy CARDIAC: RRR, no murmurs, rubs, gallops RESPIRATORY:  Clear to auscultation without rales, wheezing or rhonchi  ABDOMEN: Soft, non-tender, non-distended MUSCULOSKELETAL:  No edema; No deformity  SKIN: Warm and dry NEUROLOGIC:  Alert and oriented x 3 PSYCHIATRIC:  Normal affect   ASSESSMENT:    1. Coronary artery disease involving native heart without angina pectoris, unspecified vessel or lesion type   2. Essential hypertension   3. Non-ST elevation (NSTEMI) myocardial infarction (HCC)   4. Hyperlipidemia, mixed    PLAN:    In order of problems listed above:  Nonobstructive CAD Cardiac CTA 08/2021 showed coronary calcium score of 101, 81st percentile for age and sex matched, mild LAD stenosis, mild-nonobstructive CAD. Patient denies anginal symptoms. Continue Aspirin, statin, and BB therapy.   HTN BP is very elevated today, even on re-check. She had her medications today. She reports she is under a lot of stress regarding her leaking silicone implant. She says BP is generally under control at home. I asked her to check BP  at home for 1 week and call in with numbers. Continue current medications.   Tachyarrhythmia Heart monitor showed NSR, 1 run of NSVT 4 beats long, 26 runs of SVT, longest 18 beats, PVC burden of 5%. Symptoms are well controlled on Toprol 50mg  daily.   HLD Refill Lipitor 40mg  daily. We will repeat fasting lipid.  Disposition: Follow up in 4 month(s) with MD/APP     Signed, Marlyss Cissell , PA-C  10/14/2021 3:50 PM    [facility]

## 2021-10-14 NOTE — Patient Instructions (Signed)
Medication Instructions:  - Your physician recommends that you continue on your current medications as directed. Please refer to the Current Medication list given to you today.  *If you need a refill on your cardiac medications before your next appointment, please call your pharmacy*   Lab Work: - Your physician recommends that you return for a FASTING lipid profile: at your convenience  Medical Mall Entrance at [facility] 1st desk on the right to check in (REGISTRATION)  Lab hours: Monday- Friday (7:30 am- 5:30 pm)   If you have labs (blood work) drawn today and your tests are completely normal, you will receive your results only by: MyChart Message (if you have MyChart) OR A paper copy in the mail If you have any lab test that is abnormal or we need to change your treatment, we will call you to review the results.   Testing/Procedures: - none ordered   Follow-Up: At [facility], you and your health needs are our priority.  As part of our continuing mission to provide you with exceptional heart care, we have created designated Provider Care Teams.  These Care Teams include your primary Cardiologist (physician) and Advanced Practice Providers (APPs -  Physician Assistants and Nurse Practitioners) who all work together to provide you with the care you need, when you need it.  We recommend signing up for the patient portal called "MyChart".  Sign up information is provided on this After Visit Summary.  MyChart is used to connect with patients for Virtual Visits (Telemedicine).  Patients are able to view lab/test results, encounter notes, upcoming appointments, etc.  Non-urgent messages can be sent to your provider as well.   To learn more about what you can do with MyChart, go to [email or web address].    Your next appointment:   4 month(s)  The format for your next appointment:   In Person  Provider:   You may see Lorine Bears, MD or one of the following Advanced Practice  Providers on your designated Care Team:    Cadence Fransico Michael, [place]    Other Instructions N/a  Important Information About Sugar

## 2021-10-18 ENCOUNTER — Other Ambulatory Visit
Admission: RE | Admit: 2021-10-18 | Discharge: 2021-10-18 | Disposition: A | Discharge status: Home / Self Care | Payer: Medicare Other | Source: Ambulatory Visit | Attending: Medical | Admitting: Medical

## 2021-10-18 ENCOUNTER — Telehealth: Payer: Self-pay | Admitting: Medical

## 2021-10-18 DIAGNOSIS — I2510 Atherosclerotic heart disease of native coronary artery without angina pectoris: Secondary | ICD-10-CM | POA: Insufficient documentation

## 2021-10-18 LAB — LIPID PANEL
Cholesterol: 147 mg/dL (ref 0–200)
HDL: 62 mg/dL (ref >40)
LDL Cholesterol: 75 mg/dL (ref 0–99)
Total CHOL/HDL Ratio: 2.4 RATIO
Triglycerides: 48 mg/dL (ref <150)
VLDL: 10 mg/dL (ref 0–40)

## 2021-10-18 NOTE — Telephone Encounter (Signed)
Sent via [employer] attachment

## 2021-10-18 NOTE — Telephone Encounter (Signed)
H&P form received through [place], completed and signed by Terrilee Croak, PA. Form faxed to [facility] fax# [phone]. Form also sent to the patient through mychart as requested.

## 2021-11-04 NOTE — Telephone Encounter (Signed)
I left a verbal message for the nurse to call back in regard to the call earlier.

## 2021-11-04 NOTE — Telephone Encounter (Signed)
[facility] is requesting call back to get update on this clearance and also to get clarification on a diagnosis on pt. States there are some discrepancies on the physical form given and would like to discuss this.

## 2021-11-04 NOTE — Telephone Encounter (Signed)
I will fax this note over the requesting office, see previous notes from earlier today.

## 2021-11-04 NOTE — Telephone Encounter (Signed)
Veronica Warner with requesting office called back. The dx in question is if the pt had an MI. Veronica Warner, states the told her pulmonary dr then end of June that she had a mild MI. I did see in Cadence [place], [facility] 10/14/21 notes she states NSTEMI. Veronica Warner, states as the surgery is looking at being cancelled per the information sent to them; the pt is now telling them they she did not have an MI.  Per Veronica Warner, the surgeon and anesthesiologist will need to know if the pt truly had an MI and if so when. I assured Veronica Warner the best thing for me to do at this point is to send to our pre op provider.

## 2021-11-04 NOTE — Telephone Encounter (Signed)
Reviewed office visit notes and hospital records from 07/2021. She was admitted in 07/2021 with tachyarrhythmia in setting of acute gastroenteritis. She was also markedly hypertensive on arrival. High-sensitivity troponin was elevated but flat. Echo showed hyperdynamic LV with no regional wall motion abnormalities. Troponin elevation was felt to likely be due to demand ischemia from hypertensive urgency and tachyarrhythmia not ACS. Coronary CTA was ordered as an outpatient and showed only mild non-obstructive disease supporting the thought that this was a type II NSTEMI (demand ischemia) rather than true ACS with plaque rupture.  Corrin Parker, PA-C 11/04/2021 11:46 AM

## 2021-11-11 ENCOUNTER — Inpatient Hospital Stay: Payer: MEDICARE

## 2021-11-11 MED ORDER — PHENYLEPHRINE HCL 10 MG/ML SOLN (MIXTURES ONLY)
10 MG/ML | INTRAVENOUS | Status: DC | PRN
Start: 2021-11-11 — End: 2021-11-11
  Administered 2021-11-11: 14:00:00 40 via INTRAVENOUS

## 2021-11-11 MED ORDER — LACTATED RINGERS IV SOLN
INTRAVENOUS | Status: DC | PRN
Start: 2021-11-11 — End: 2021-11-11
  Administered 2021-11-11: 14:00:00 via INTRAVENOUS

## 2021-11-11 MED ORDER — PROPOFOL 100 MG/10ML IV EMUL
100 MG/10ML | INTRAVENOUS | Status: DC | PRN
Start: 2021-11-11 — End: 2021-11-11
  Administered 2021-11-11: 14:00:00 200 via INTRAVENOUS

## 2021-11-11 MED ORDER — VANCOMYCIN HCL 1 G IV SOLR
1 | INTRAVENOUS | Status: AC
Start: 2021-11-11 — End: 2021-11-11

## 2021-11-11 MED ORDER — ONDANSETRON HCL 4 MG/2ML IJ SOLN
4 MG/2ML | INTRAMUSCULAR | Status: DC | PRN
Start: 2021-11-11 — End: 2021-11-11
  Administered 2021-11-11: 14:00:00 4 via INTRAVENOUS

## 2021-11-11 MED ORDER — LIDOCAINE-EPINEPHRINE 1 %-1:200000 IJ SOLN
1 percent-:200000 | INTRAMUSCULAR | Status: DC | PRN
Start: 2021-11-11 — End: 2021-11-11
  Administered 2021-11-11: 14:00:00 20 via INTRAMUSCULAR

## 2021-11-11 MED ORDER — MIDAZOLAM HCL 2 MG/2ML IJ SOLN
2 MG/ML | INTRAMUSCULAR | Status: DC | PRN
Start: 2021-11-11 — End: 2021-11-11
  Administered 2021-11-11: 14:00:00 2 via INTRAVENOUS

## 2021-11-11 MED ORDER — FENTANYL CITRATE (PF) 100 MCG/2ML IJ SOLN
100 MCG/2ML | INTRAMUSCULAR | Status: AC
Start: 2021-11-11 — End: ?

## 2021-11-11 MED ORDER — GLYCOPYRROLATE PF 0.4 MG/2ML IJ SOSY
0.4 MG/2ML | INTRAMUSCULAR | Status: AC
Start: 2021-11-11 — End: ?

## 2021-11-11 MED ORDER — CEFAZOLIN SODIUM 1 G IJ SOLR
1 | INTRAMUSCULAR | Status: AC
Start: 2021-11-11 — End: 2021-11-11

## 2021-11-11 MED ORDER — MIDAZOLAM HCL 2 MG/2ML IJ SOLN
2 MG/ML | INTRAMUSCULAR | Status: AC
Start: 2021-11-11 — End: ?

## 2021-11-11 MED ORDER — STERILE WATER FOR INJECTION IJ SOLN
INTRAMUSCULAR | Status: AC
Start: 2021-11-11 — End: ?

## 2021-11-11 MED ORDER — LIDOCAINE-EPINEPHRINE 1 %-1:200000 IJ SOLN
1 percent-:200000 | INTRAMUSCULAR | Status: AC
Start: 2021-11-11 — End: ?

## 2021-11-11 MED ORDER — PHENYLEPHRINE HCL (PRESSORS) 10 MG/ML IV SOLN
10 MG/ML | INTRAVENOUS | Status: AC
Start: 2021-11-11 — End: ?

## 2021-11-11 MED ORDER — CEFAZOLIN SODIUM 1 G IJ SOLR
1 g | INTRAMUSCULAR | Status: DC | PRN
Start: 2021-11-11 — End: 2021-11-11
  Administered 2021-11-11: 14:00:00 2 via INTRAVENOUS

## 2021-11-11 MED ORDER — LIDOCAINE HCL (PF) 2 % IJ SOLN
2 % | INTRAMUSCULAR | Status: DC | PRN
Start: 2021-11-11 — End: 2021-11-11
  Administered 2021-11-11: 14:00:00 60 via INTRAVENOUS

## 2021-11-11 MED ORDER — FENTANYL CITRATE (PF) 100 MCG/2ML IJ SOLN
100 MCG/2ML | INTRAMUSCULAR | Status: DC | PRN
Start: 2021-11-11 — End: 2021-11-11
  Administered 2021-11-11: 15:00:00 50 via INTRAVENOUS
  Administered 2021-11-11 (×2): 25 via INTRAVENOUS

## 2021-11-11 MED ORDER — GLYCOPYRROLATE 0.2 MG/ML IJ SOLN
0.2 MG/ML | INTRAMUSCULAR | Status: DC | PRN
Start: 2021-11-11 — End: 2021-11-11
  Administered 2021-11-11: 14:00:00 .2 via INTRAVENOUS

## 2021-11-11 MED ORDER — DEXAMETHASONE 4 MG/ML IJ SOLN (MIXTURES ONLY)
4 MG/ML | INTRAMUSCULAR | Status: DC | PRN
Start: 2021-11-11 — End: 2021-11-11
  Administered 2021-11-11: 14:00:00 8 via INTRAVENOUS

## 2021-11-11 MED ORDER — GENTAMICIN SULFATE 40 MG/ML IJ SOLN
40 MG/ML | INTRAMUSCULAR | Status: AC
Start: 2021-11-11 — End: ?

## 2021-11-11 MED ORDER — BUPIVACAINE HCL (PF) 0.25 % IJ SOLN
0.25 % | INTRAMUSCULAR | Status: AC
Start: 2021-11-11 — End: ?

## 2021-11-11 MED FILL — PHENYLEPHRINE HCL (PRESSORS) 10 MG/ML IV SOLN: 10 MG/ML | INTRAVENOUS | Qty: 1

## 2021-11-11 MED FILL — CEFAZOLIN SODIUM 1 G IJ SOLR: 1 g | INTRAMUSCULAR | Qty: 2000

## 2021-11-11 MED FILL — STERILE WATER FOR INJECTION IJ SOLN: INTRAMUSCULAR | Qty: 20

## 2021-11-11 MED FILL — GLYCOPYRROLATE PF 0.4 MG/2ML IJ SOSY: 0.4 MG/2ML | INTRAMUSCULAR | Qty: 2

## 2021-11-11 MED FILL — FENTANYL CITRATE (PF) 100 MCG/2ML IJ SOLN: 100 MCG/2ML | INTRAMUSCULAR | Qty: 2

## 2021-11-11 MED FILL — GENTAMICIN SULFATE 40 MG/ML IJ SOLN: 40 MG/ML | INTRAMUSCULAR | Qty: 20

## 2021-11-11 MED FILL — BUPIVACAINE HCL (PF) 0.25 % IJ SOLN: 0.25 % | INTRAMUSCULAR | Qty: 30

## 2021-11-11 MED FILL — VANCOMYCIN HCL 1 G IV SOLR: 1 g | INTRAVENOUS | Qty: 2000

## 2021-11-11 MED FILL — XYLOCAINE-MPF/EPINEPHRINE 1 %-1:200000 IJ SOLN: 1 %-:200000 | INTRAMUSCULAR | Qty: 30

## 2021-11-11 MED FILL — MIDAZOLAM HCL 2 MG/2ML IJ SOLN: 2 MG/ML | INTRAMUSCULAR | Qty: 2

## 2021-11-11 NOTE — Other (Signed)
I have reviewed discharge instructions with the  spouse.  The  spouse verbalized understanding. All questions addressed at this time. A paper copy of these instructions have been given to the patient to take home.

## 2021-11-11 NOTE — H&P (Signed)
Pre-op History and Physical    CC: Encounter for cosmetic surgery [Z41.1]   HPI: 66 y.o. year old female with Encounter for cosmetic surgery [Z41.1] for Procedure(s):  REMOVAL AND REPLACEMENT OF BILATERAL BREAST IMLANTS AND RIGHT BREAST IMPLANT MATERIAL.  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    Ventricular premature beats 08/08/2020      Past surgical history:   Past Surgical History:   Procedure Laterality Date    BREAST SURGERY Bilateral 2011    BUNIONECTOMY      CHOLECYSTECTOMY  2021      Family history:   Family History   Problem Relation Age of Onset    Hypertension Mother     Other Brother         Factor V Leiden    Hypertension Brother     Thyroid Disease Mother         Unsure if overactive or underactive    Cancer Mother         glioblastoma    Thyroid Disease Brother       Social history:   Social History     Socioeconomic History    Marital status: Married     Spouse name: Not on file    Number of children: Not on file    Years of education: Not on file    Highest education level: Not on file   Occupational History    Not on file   Tobacco Use    Smoking status: Former     Packs/day: 1.00     Types: Cigarettes     Quit date: 03/24/2012     Years since quitting: 9.6    Smokeless tobacco: Never   Substance and Sexual Activity    Alcohol use: Not Currently    Drug use: Yes     Types: Marijuana Sheran Fava)    Sexual activity: Not on file   Other Topics Concern    Not on file   Social History Narrative    Not on file     Social Determinants of Health     Financial Resource Strain: Not on file   Food Insecurity: Not on file   Transportation Needs: Not on file   Physical Activity: Not on file   Stress: Not on file   Social Connections: Not on file   Intimate Partner Violence: Not on file    Housing Stability: Not on file      Home Medications:   Prior to Admission medications    Medication Sig Start Date End Date Taking? Authorizing Provider   ALPRAZolam Prudy Feeler) 1 MG tablet Take 1 tablet by mouth 2 times daily as needed. 08/07/21  Yes Historical Provider, MD   amLODIPine (NORVASC) 10 MG tablet Take 1 tablet by mouth daily 10/29/21   Historical Provider, MD   CALCIUM-MAGNESIUM-ZINC PO Take 1 tablet by mouth daily    Ar Automatic Reconciliation   albuterol (ACCUNEB) 0.63 MG/3ML nebulizer solution Inhale 3 mLs into the lungs every 6 hours as needed    Ar Automatic Reconciliation   ascorbic acid (VITAMIN C) 500 MG tablet Take 8 tablets by mouth daily  Patient not taking: Reported on 11/11/2021  Ar Automatic Reconciliation   aspirin 81 MG chewable tablet Take 1 tablet by mouth 02/26/20   Ar Automatic Reconciliation   atomoxetine (STRATTERA) 25 MG capsule Take 1 capsule by mouth daily (with breakfast) 12/25/20   Ar Automatic Reconciliation   cloNIDine (CATAPRES) 0.1 MG tablet Take 1 tablet by mouth 2 times daily 06/03/21   Ar Automatic Reconciliation   famotidine (PEPCID) 20 MG tablet Take 1 tablet by mouth daily 02/26/20   Ar Automatic Reconciliation   metoprolol succinate (TOPROL XL) 25 MG extended release tablet ceived the following from [facility]: Outside name: metoprolol succinate (TOPROL-XL) 25 mg XL tablet 07/27/20   Ar Automatic Reconciliation   montelukast (SINGULAIR) 10 MG tablet ceived the following from [facility]: Outside name: montelukast (SINGULAIR) 10 mg tablet 11/22/20   Ar Automatic Reconciliation   tiotropium (SPIRIVA) 18 MCG inhalation capsule Inhale 1 capsule into the lungs daily    Ar Automatic Reconciliation   venlafaxine (EFFEXOR XR) 75 MG extended release capsule Take 1 capsule by mouth daily 12/04/20   Ar Automatic Reconciliation      Allergies:   Allergies   Allergen Reactions    Mixed Ragweed      Other reaction(s): Sneezing    Ondansetron Hcl Other (See  Comments)     Does not respond well      Review of systems:  Denies headache, fever, chills, weight change, congestion, sore throat, chest pain, shortness of breath, nausea, vomiting, diarrhea, constipation, abdominal pain, generalized weakness, muscle or joint pain, and rash.    Physical Exam:  Vitals: Blood pressure (!) 119/55, pulse 55, temperature 97.7 F (36.5 C), temperature source Oral, resp. rate 18, height 5\' 6"  (1.676 m), weight 116 lb (52.6 kg), SpO2 97 %.   General: awake and alert, NAD  Neck: supple  Cor: RRR  Lungs: clear  Abdomen: soft, non-tender, non-distended  Extremities: no edema  Skin: no rash    Impression: Encounter for cosmetic surgery [Z41.1]    Plan:  Procedure(s):  REMOVAL AND REPLACEMENT OF BILATERAL BREAST IMLANTS AND RIGHT BREAST IMPLANT MATERIAL

## 2021-11-11 NOTE — Anesthesia Post-Procedure Evaluation (Signed)
Department of Anesthesiology  Postprocedure Note    Patient: Kristine Banks  MRN: [id number]  Birthdate: 1956/02/22  Date of evaluation: 11/11/2021      Procedure Summary     Date: 11/11/21 Room / Location: [facility]    Anesthesia Start: 0933 Anesthesia Stop: 1050    Procedure: REMOVAL AND REPLACEMENT OF BILATERAL BREAST IMLANTS AND RIGHT BREAST IMPLANT MATERIAL (Breast) Diagnosis:       Encounter for cosmetic surgery      (Encounter for cosmetic surgery [Z41.1])    Surgeons: Cresenciano Genre, MD Responsible Provider: Cristobal Goldmann, MD    Anesthesia Type: General ASA Status: 2          Anesthesia Type: General    Aldrete Phase I: Aldrete Score: 10    Aldrete Phase II:        Anesthesia Post Evaluation    Patient location during evaluation: PACU  Patient participation: complete - patient participated  Level of consciousness: awake  Airway patency: patent  Nausea & Vomiting: no nausea  Complications: no  Cardiovascular status: blood pressure returned to baseline and hemodynamically stable  Respiratory status: acceptable  Hydration status: stable  Multimodal analgesia pain management approach

## 2021-11-11 NOTE — Anesthesia Post-Procedure Evaluation (Signed)
Department of Anesthesiology  Postprocedure Note    Patient: Elfa E Lengyel  MRN: [id number]  Birthdate: 02/27/1956  Date of evaluation: 11/11/2021      Procedure Summary     Date: 11/11/21 Room / Location: [facility]    Anesthesia Start: 0933 Anesthesia Stop: 1050    Procedure: REMOVAL AND REPLACEMENT OF BILATERAL BREAST IMLANTS AND RIGHT BREAST IMPLANT MATERIAL (Breast) Diagnosis:       Encounter for cosmetic surgery      (Encounter for cosmetic surgery [Z41.1])    Surgeons: Darlene M Sparkman, MD Responsible Provider: Masae Lukacs C Daira Hine, MD    Anesthesia Type: General ASA Status: 2          Anesthesia Type: General    Aldrete Phase I: Aldrete Score: 10    Aldrete Phase II:        Anesthesia Post Evaluation    Patient location during evaluation: PACU  Patient participation: complete - patient participated  Level of consciousness: awake  Airway patency: patent  Nausea & Vomiting: no nausea  Complications: no  Cardiovascular status: blood pressure returned to baseline and hemodynamically stable  Respiratory status: acceptable  Hydration status: stable  Multimodal analgesia pain management approach

## 2021-11-11 NOTE — Op Note (Signed)
Operative Note      Patient: Kristine Banks  Date of Birth: [birth year]  MRN: [id number]    Date of Procedure: 12-01-2021    Pre-Op Diagnosis Codes:     * Encounter for cosmetic surgery [Z41.1]    Post-Op Diagnosis: Same       Procedure(s):  REMOVAL AND REPLACEMENT OF BILATERAL BREAST IMLANTS AND RIGHT BREAST IMPLANT MATERIAL    Surgeon(s):  Cresenciano Genre, MD    Assistant:   Surgical Assistant: Elmer Bales    Anesthesia: General    Estimated Blood Loss (mL): Minimal    Complications: None    Specimens:   * No specimens in log *    Implants:  Implant Name Type Inv. Item Serial No. Manufacturer Lot No. LRB No. Used Action   IMPLANT BRST GEL 3.6 CM PROJCT 12 CM 300 CC MOD PROF HSC - [id number]  IMPLANT BRST GEL 3.6 CM PROJCT 12 CM 300 CC MOD PROF HSC [id number] [employer] NA Right 1 Implanted   IMPLANT BRST GEL 3.6 CM PROJCT 12 CM 300 CC MOD PROF HSC - [id number]  IMPLANT BRST GEL 3.6 CM PROJCT 12 CM 300 CC MOD PROF HSC [id number] [employer] NA Left 1 Implanted         Drains: * No LDAs found *    Findings:   Right breast:  Left breast:      Detailed Description of Procedure:   Ms. Scheid was seen in the pre-operative area where the planned procedure was again reviewed and the site of surgery was marked, with the patient confirming the correct procedure and location. Consent was verified to be present. All remaining questions were answered. The patient was then taken to the operating room and placed supine on the operating table. General anesthesia was then given. The patient was then prepped and draped in the usual sterile fashion using chloroprep. A time out was performed to confirm the correct patient, procedure, laterality, and presence of all necessary equipment in the room.       Attention was first turned to the left breast. Her previous augmentation incision was re-incised and dissection carried out to the level of the capsule. The capsule was then incised and the implant identified. It was  removed and noted to be intact. The capsule was then investigated and noted to be very thin with no abnormalities. A moistened lap was then placed over the incision and attention turned to the right breast. Her previous augmentation incision was re-incised and dissection carried out to the level of the capsule. The capsule was then incised and the implant identified. It was noted to be ruptured and yellow-colored in appearance. It was removed with assistance of asepto syringe and suction onto a towel to avoid contamination of the skin. It was then placed on the back table along with the other implant, as well. The capsule was then investigated and noted to be also very thin and without abnormalities anywhere. Therefore, no capsule was removed as this would simply prove damaging to the surrounding tissue without justification for removal. The right breast pocket was irrigated with saline and rubbed clean with a lap sponge several times to ensure any and all remnant material was removed. 22mL of 1% liodcaine with epinephrine mixed with 0.25% marcaine was then injected at the incisions bilaterally. Both breast pockets were then irrigated with double antibiotic irrigation (Vanc/Gent).     Gloves were changed. The implants were then opened and soaked in double antibiotic saline.  One was then inserted into the right breast pocket. The capsule was then approximated using 3-0 Vicryl sutures. The incision was then closed using 3-0 Monocryl deep dermal sutures and a running 4-0 Monocryl subcuticular.  The other implant was then inserted into the right breast pocket. The capsule was then approximated using 3-0 Vicryl sutures. The incision was then closed using 3-0 Monocryl deep dermal sutures and a running 4-0 Monocryl subcuticular.    The incisions were then dressed with Dermabond and ABD pads, secured with a bra. All counts were correct at the end of the procedure.  I was scrubbed for the entire duration of the procedure.        Electronically signed by Cresenciano Genre, MD on 11/11/2021 at 2:56 PM

## 2021-11-11 NOTE — Anesthesia Pre-Procedure Evaluation (Signed)
Department of Anesthesiology  Preprocedure Note       Name:  Kristine Banks   Age:  66 y.o.  DOB:  [birth year]                                          MRN:  [id number]         Date:  11/11/2021      Surgeon: Moishe Spice):  Cresenciano Genre, MD    Procedure: Procedure(s):  REMOVAL AND REPLACEMENT OF BILATERAL BREAST IMLANTS AND RIGHT BREAST IMPLANT MATERIAL    Medications prior to admission:   Prior to Admission medications    Medication Sig Start Date End Date Taking? Authorizing Provider   ALPRAZolam Prudy Feeler) 1 MG tablet Take 1 tablet by mouth 2 times daily as needed. 08/07/21  Yes Historical Provider, MD   amLODIPine (NORVASC) 10 MG tablet Take 1 tablet by mouth daily 10/29/21   Historical Provider, MD   CALCIUM-MAGNESIUM-ZINC PO Take 1 tablet by mouth daily    Ar Automatic Reconciliation   albuterol (ACCUNEB) 0.63 MG/3ML nebulizer solution Inhale 3 mLs into the lungs every 6 hours as needed    Ar Automatic Reconciliation   ascorbic acid (VITAMIN C) 500 MG tablet Take 8 tablets by mouth daily  Patient not taking: Reported on 11/11/2021    Ar Automatic Reconciliation   aspirin 81 MG chewable tablet Take 1 tablet by mouth 02/26/20   Ar Automatic Reconciliation   atomoxetine (STRATTERA) 25 MG capsule Take 1 capsule by mouth daily (with breakfast) 12/25/20   Ar Automatic Reconciliation   cloNIDine (CATAPRES) 0.1 MG tablet Take 1 tablet by mouth 2 times daily 06/03/21   Ar Automatic Reconciliation   famotidine (PEPCID) 20 MG tablet Take 1 tablet by mouth daily 02/26/20   Ar Automatic Reconciliation   metoprolol succinate (TOPROL XL) 25 MG extended release tablet ceived the following from [facility]: Outside name: metoprolol succinate (TOPROL-XL) 25 mg XL tablet 07/27/20   Ar Automatic Reconciliation   montelukast (SINGULAIR) 10 MG tablet ceived the following from [facility]: Outside name: montelukast (SINGULAIR) 10 mg tablet 11/22/20   Ar Automatic Reconciliation   tiotropium (SPIRIVA) 18 MCG  inhalation capsule Inhale 1 capsule into the lungs daily    Ar Automatic Reconciliation   venlafaxine (EFFEXOR XR) 75 MG extended release capsule Take 1 capsule by mouth daily 12/04/20   Ar Automatic Reconciliation       Current medications:    No current facility-administered medications for this encounter.       Allergies:    Allergies   Allergen Reactions   . Mixed Ragweed      Other reaction(s): Sneezing   . Ondansetron Hcl Other (See Comments)     Does not respond well       Problem List:    Patient Active Problem List   Diagnosis Code   . Coronary artery disease involving native coronary artery of native heart without angina pectoris I25.10   . Primary hypertension I10   . Depression F32.A   . Acute metabolic encephalopathy G93.41   . Gastroenteritis K52.9   . History of alcohol abuse F10.11   . 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 Chronic renal disease, stage III (HCC) N18.30       Past Medical History:        Diagnosis Date   .  Anxiety    . Depression    . Hypertension    . Microscopic colitis 07/03/2020   . Myocardial infarction (HCC)    . Pancreatitis 2004   . Recovering alcoholic (HCC)    . 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 Ventricular premature beats 08/08/2020       Past Surgical History:        Procedure Laterality Date   . BREAST SURGERY Bilateral 2011   . BUNIONECTOMY     . CHOLECYSTECTOMY  2021       Social History:    Social History     Tobacco Use   . Smoking status: Former     Packs/day: 1.00     Types: Cigarettes     Quit date: 03/24/2012     Years since quitting: 9.6   . Smokeless tobacco: Never   Substance Use Topics   . Alcohol use: Not Currently                                Counseling given: Not Answered      Vital Signs (Current): There were no vitals filed for this visit.                                           BP Readings from Last 3 Encounters:   04/19/21 (!) 178/64   03/28/21 (!) 160/58   12/25/20 123/61       NPO Status: Time of last liquid consumption: 0530                         Time of last solid consumption: 1830                        Date of last liquid consumption: 11/11/21                        Date of last solid food consumption: 11/10/21    BMI:   Wt Readings from Last 3 Encounters:   04/19/21 55.3 kg (122 lb)   03/28/21 52.2 kg (115 lb)   12/25/20 55.3 kg (122 lb)     There is no height or weight on file to calculate BMI.    CBC:   Lab Results   Component Value Date/Time    WBC 10.1 08/08/2020 05:17 AM    RBC 3.93 08/08/2020 05:17 AM    HGB 12.1 08/08/2020 05:17 AM    HCT 35.6 08/08/2020 05:17 AM    MCV 90.6 08/08/2020 05:17 AM    RDW 13.2 08/08/2020 05:17 AM    PLT 319 08/08/2020 05:17 AM       CMP:   Lab Results   Component Value Date/Time    NA 140 10/09/2020 11:39 AM    K 4.8 10/09/2020 11:39 AM    CL 107 10/09/2020 11:39 AM    CO2 30 10/09/2020 11:39 AM    BUN 14 10/09/2020 02:45 PM    CREATININE 0.95 10/09/2020 02:45 PM    GFRAA >60 10/09/2020 02:45 PM    AGRATIO 0.8 08/08/2020 05:17 AM    GLUCOSE 81 10/09/2020 11:39 AM    PROT 6.2 08/08/2020 05:17 AM    CALCIUM 8.9 10/09/2020 11:39 AM    BILITOT 0.8  08/08/2020 05:17 AM    ALKPHOS 55 08/08/2020 05:17 AM    AST 15 08/08/2020 05:17 AM    ALT 25 08/08/2020 05:17 AM       POC Tests: No results for input(s): POCGLU, POCNA, POCK, POCCL, POCBUN, POCHEMO, POCHCT in the last 72 hours.    Coags: No results found for: PROTIME, INR, APTT    HCG (If Applicable): No results found for: PREGTESTUR, PREGSERUM, HCG, HCGQUANT     ABGs: No results found for: PHART, PO2ART, PCO2ART, HCO3ART, BEART, O2SATART     Type & Screen (If Applicable):  No results found for: LABABO, LABRH    Drug/Infectious Status (If Applicable):  Lab Results   Component Value Date/Time    HEPCAB 0.2 04/01/2018 12:00 AM       COVID-19 Screening (If Applicable): No results found for: COVID19        Anesthesia Evaluation  Patient summary reviewed and Nursing notes reviewed  Airway: Mallampati: I  TM distance: >3 FB   Neck ROM: full  Mouth opening: > = 3 FB   Dental:  normal exam         Pulmonary:Negative Pulmonary ROS breath sounds clear to auscultation                             Cardiovascular:Negative CV ROS  Exercise tolerance: good (>4 METS),   (+) hypertension:,     (-) past MI and CAD    NYHA Classification: I    Rhythm: regular  Rate: normal                    Neuro/Psych:   Negative Neuro/Psych ROS              GI/Hepatic/Renal: Neg GI/Hepatic/Renal ROS            Endo/Other: Negative Endo/Other ROS                    Abdominal: normal exam            Vascular: negative vascular ROS.         Other Findings:           Anesthesia Plan      general     ASA 2       Induction: intravenous.    MIPS: Postoperative opioids intended and Prophylactic antiemetics administered.  Anesthetic plan and risks discussed with patient.      Plan discussed with CRNA.                    Hoover Browns, MD   11/11/2021

## 2022-02-17 ENCOUNTER — Ambulatory Visit: Payer: Medicare Other | Admitting: Medical

## 2022-02-17 NOTE — Progress Notes (Deleted)
Cardiology Office Note:    Date:  02/17/2022   ID:  Veronica Warner, DOB 1955-12-31, MRN [id number]  PCP:  Pcp, No  [facility] Cardiologist:  None  [facility] Electrophysiologist:  None   Referring MD: No ref. provider found   Chief Complaint: 4 month follow-up  History of Present Illness:    Veronica Warner is a 66 y.o. 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    H/o LHC in [place] in 2021 showing nonobstructive CAD. Patient does not believe she has CAD.   She was hospitalized in May 2023 for abdominal pain and weakness found to have gastroenteritis and NSTEMI. BP was severely elevated and heart rate was fast. There was a question of afib in transit to to the ER. Troponin was elevated to 410 and was started on IV heparin. Echo showed LVEF>75%. BP meds were controlled. Plan was for outpatient stress vs cardiac CTA.    She was seen 08/12/21 for hospital follow-up. Cardiac CTA and heart monitor were ordered. This showed coronary calcium score of 101, 81st percentile for age and sex matched, 25% mild LAD stenosis, overall mild non-obstructive CAD. Heart monitor showed NSR, 1 run NSVT lasting 4 beats, 26 runs of SVT, longest 18 beats, occasional PVCs.   Last seen 10/14/21 and reported leaking breast implant with plan to take it out. BP was high, but she suspected it was from stress, and reported it was better at home. NO changes were made.   Today,   BP  Past Medical History:  Diagnosis Date   Coronary artery disease    Hypertension    Metabolic acidosis, increased anion gap (IAG) 08/05/2021   Pancreatitis     Past Surgical History:  Procedure Laterality Date   CARDIAC CATHETERIZATION     2021 no stents   CHOLECYSTECTOMY      Current Medications: No outpatient  medications have been marked as taking for the 02/17/22 encounter (Appointment) with Fransico Michael, Laveda Demedeiros H, PA-C.     Allergies:   Ondansetron hcl and Short ragweed pollen ext   Social History   Socioeconomic History   Marital status: Married    Spouse name: Not on file   Number of children: Not on file   Years of education: Not on file   Highest education level: Not on file  Occupational History   Not on file  Tobacco Use   Smoking status: Never   Smokeless tobacco: Never  Vaping Use   Vaping Use: Every day  Substance and Sexual Activity   Alcohol use: Not Currently   Drug use: Yes    Types: Marijuana    Comment: Delta 8 vaping   Sexual activity: Not on file  Other Topics Concern   Not on file  Social History Narrative   Not on file   Social Determinants of Health   Financial Resource Strain: Not on file  Food Insecurity: Not on file  Transportation Needs: Not on file  Physical Activity: Not on file  Stress: Not on file  Social Connections: Not on file     Family History: The patient's ***family history includes Cancer in her mother; Factor V Leiden deficiency in an other family member; Hypertension in her brother and mother; Hypothyroidism in her mother; Stroke in her maternal grandmother; Thyroid disease in her brother.  ROS:   Please see the history of present illness.    *** All other systems reviewed and are negative.  EKGs/Labs/Other Studies Reviewed:    The following studies were reviewed today: ***  EKG:  EKG is *** ordered today.  The ekg ordered today demonstrates ***  Recent Labs: 08/04/2021: ALT 24 08/05/2021: TSH 0.962 08/07/2021: BUN 14; Creatinine, Ser 0.91; Hemoglobin 13.2; Magnesium 2.4; Platelets 393; Potassium 3.9; Sodium 137  Recent Lipid Panel    Component Value Date/Time   CHOL 147 10/18/2021 1015   TRIG 48 10/18/2021 1015   HDL 62 10/18/2021 1015   CHOLHDL 2.4 10/18/2021 1015   VLDL 10 10/18/2021 1015   LDLCALC 75 10/18/2021 1015      Risk Assessment/Calculations:   {Does this patient have ATRIAL FIBRILLATION?:[id number]}   Physical Exam:    VS:  There were no vitals taken for this visit.    Wt Readings from Last 3 Encounters:  10/14/21 121 lb 3.2 oz (55 kg)  08/12/21 120 lb 2 oz (54.5 kg)  08/04/21 112 lb (50.8 kg)     GEN: *** Well nourished, well developed in no acute distress HEENT: Normal NECK: No JVD; No carotid bruits LYMPHATICS: No lymphadenopathy CARDIAC: ***RRR, no murmurs, rubs, gallops RESPIRATORY:  Clear to auscultation without rales, wheezing or rhonchi  ABDOMEN: Soft, non-tender, non-distended MUSCULOSKELETAL:  No edema; No deformity  SKIN: Warm and dry NEUROLOGIC:  Alert and oriented x 3 PSYCHIATRIC:  Normal affect   ASSESSMENT:    No diagnosis found. PLAN:    In order of problems listed above:  ***  Disposition: Follow up {follow up:15908} with ***   Shared Decision Making/Informed Consent   {Are you ordering a CV Procedure (e.g. stress test, cath, DCCV, TEE, etc)?   Press F2        :[id number]}    Signed, Lafern Brinkley Ardelle Lesches  02/17/2022 7:57 AM    [facility]

## 2022-02-19 ENCOUNTER — Other Ambulatory Visit
Admission: RE | Admit: 2022-02-19 | Discharge: 2022-02-19 | Disposition: A | Discharge status: Home / Self Care | Payer: Medicare Other | Source: Ambulatory Visit | Attending: Medical | Admitting: Medical

## 2022-02-19 ENCOUNTER — Encounter: Payer: Self-pay | Admitting: Medical

## 2022-02-19 ENCOUNTER — Ambulatory Visit: Payer: Medicare Other | Attending: Medical | Admitting: Medical

## 2022-02-19 VITALS — BP 104/60 | HR 64 | Ht 66.5 in | Wt 109.4 lb

## 2022-02-19 DIAGNOSIS — I10 Essential (primary) hypertension: Secondary | ICD-10-CM | POA: Insufficient documentation

## 2022-02-19 DIAGNOSIS — I2510 Atherosclerotic heart disease of native coronary artery without angina pectoris: Secondary | ICD-10-CM | POA: Insufficient documentation

## 2022-02-19 DIAGNOSIS — R000 Tachycardia, unspecified: Secondary | ICD-10-CM | POA: Insufficient documentation

## 2022-02-19 DIAGNOSIS — R5383 Other fatigue: Secondary | ICD-10-CM | POA: Insufficient documentation

## 2022-02-19 DIAGNOSIS — R634 Abnormal weight loss: Secondary | ICD-10-CM | POA: Insufficient documentation

## 2022-02-19 DIAGNOSIS — I214 Non-ST elevation (NSTEMI) myocardial infarction: Secondary | ICD-10-CM | POA: Insufficient documentation

## 2022-02-19 LAB — COMPREHENSIVE METABOLIC PANEL
ALT: 19 U/L (ref 0–44)
AST: 19 U/L (ref 15–41)
Albumin: 3.8 g/dL (ref 3.5–5.0)
Alkaline Phosphatase: 95 U/L (ref 38–126)
Anion gap: 7 (ref 5–15)
BUN: 16 mg/dL (ref 8–23)
CO2: 26 mmol/L (ref 22–32)
Calcium: 9.2 mg/dL (ref 8.9–10.3)
Chloride: 107 mmol/L (ref 98–111)
Creatinine, Ser: 0.77 mg/dL (ref 0.44–1.00)
GFR, Estimated: >60 mL/min (ref >60)
Glucose, Bld: 108 mg/dL — ABNORMAL HIGH (ref 70–99)
Potassium: 3.9 mmol/L (ref 3.5–5.1)
Sodium: 140 mmol/L (ref 135–145)
Total Bilirubin: 0.5 mg/dL (ref 0.3–1.2)
Total Protein: 7.9 g/dL (ref 6.5–8.1)

## 2022-02-19 LAB — CBC
HCT: 37.9 % (ref 36.0–46.0)
Hemoglobin: 12.4 g/dL (ref 12.0–15.0)
MCH: 30 pg (ref 26.0–34.0)
MCHC: 32.7 g/dL (ref 30.0–36.0)
MCV: 91.5 fL (ref 80.0–100.0)
Platelets: 386 10*3/uL (ref 150–400)
RBC: 4.14 MIL/uL (ref 3.87–5.11)
RDW: 15.6 % — ABNORMAL HIGH (ref 11.5–15.5)
WBC: 8.4 10*3/uL (ref 4.0–10.5)
nRBC: 0 % (ref 0.0–0.2)

## 2022-02-19 LAB — TSH: TSH: 0.906 u[IU]/mL (ref 0.350–4.500)

## 2022-02-19 NOTE — Patient Instructions (Addendum)
Medication Instructions:  No changes at this time.   *If you need a refill on your cardiac medications before your next appointment, please call your pharmacy*   Lab Work: CBC, CMET, and TSH today over at the [facility]. Stop at registration  If you have labs (blood work) drawn today and your tests are completely normal, you will receive your results only by: MyChart Message (if you have MyChart) OR A paper copy in the mail If you have any lab test that is abnormal or we need to change your treatment, we will call you to review the results.   Testing/Procedures: None   Follow-Up: At [facility], you and your health needs are our priority.  As part of our continuing mission to provide you with exceptional heart care, we have created designated Provider Care Teams.  These Care Teams include your primary Cardiologist (physician) and Advanced Practice Providers (APPs -  Physician Assistants and Nurse Practitioners) who all work together to provide you with the care you need, when you need it.   Your next appointment:   4 month(s)  The format for your next appointment:   In Person  Provider:   Lorine Bears, MD or Cadence Fransico Michael, [place]        Important Information About Sugar

## 2022-02-19 NOTE — Progress Notes (Signed)
Cardiology Office Note:    Date:  02/19/2022   ID:  Veronica Warner, DOB 06/22/1955, MRN [id number]  PCP:  Marchal, Matthew, MD  [facility] Cardiologist:  None  [facility] Electrophysiologist:  None   Referring MD: No ref. provider found   Chief Complaint: 4 month follow-up  History of Present Illness:    Veronica Warner is a 66 y.o. 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    H/o LHC in [place] in 2021 showing nonobstructive CAD. Patient does not believe she has CAD.   She was hospitalized for abdominal pain and weakness found to have gastroenteritis. BP was severely elevated and heart rate was fast. There was a question of afib in transit to to the ER. Troponin was elevated to 410 and was started on IV heparin. Echo showed LVEF>75%. BP meds were controlled. Plan was for outpatient stress vs cardiac CTA.    The patient was seen 08/12/21 for hospital follow-up. Cardiac CTA and heart monitor were ordered. This showed coronary calcium score of 101, 81st percentile for age and sex matched, 25% mild LAD stenosis, overall mild non-obstructive CAD. Heart monitor showed NSR, 1 run NSVT lasting 4 beats, 26 runs of SVT, longest 18 beats, occasional PVCs.   Last seen 10/14/21 and reported leaking breast implants. BP was high, but she was also under a lot of stress.  No changes were made.  Today, the patient reports weight loss. Some days she eats a lot, and other days she doesn't want to eat at all. She was started on ADD medication, Adderall.  She feels this precipitated her weight loss.  She denies fever or night sweats. Denies chest pain or shortness of breath. No LLE, orthopnea, pnd. Overall, she feels tired and fatigued.  She continues to have intermittent GI pain, about every  3 months she has an episode.  She has an appoitment with GI next week. No further palpitations.   Past Medical History:  Diagnosis Date   Coronary artery disease    Hypertension    Metabolic acidosis, increased anion gap (IAG) 08/05/2021   Pancreatitis     Past Surgical History:  Procedure Laterality Date   CARDIAC CATHETERIZATION     2021 no stents   CHOLECYSTECTOMY      Current Medications: Current Meds  Medication Sig   ADDERALL 10 MG tablet Take 10 mg by mouth daily with breakfast.   albuterol (VENTOLIN HFA) 108 (90 Base) MCG/ACT inhaler Inhale 2 puffs into the lungs every 6 (six) hours as needed for wheezing or shortness of breath.   ALPRAZolam (XANAX) 1 MG tablet Take 1 tablet (1 mg total) by mouth 2 (two) times daily as needed for anxiety.   amLODipine (NORVASC) 10 MG tablet Take 10 mg by mouth daily.   ascorbic acid (VITAMIN C) 500 MG tablet Take 4,000 mg by mouth in the morning.   aspirin EC 81 MG tablet Take 81 mg by mouth daily. Swallow whole.   atorvastatin (LIPITOR) 40 MG tablet Take 1 tablet (40 mg total) by mouth daily.   cloNIDine (CATAPRES) 0.1 MG tablet Take 0.1 mg by mouth 2 (two) times daily.   colestipol (COLESTID) 1 g tablet Take 2 g by mouth daily as needed.   famotidine (PEPCID) 20 MG tablet Take 20 mg by mouth   daily.   metoprolol succinate (TOPROL-XL) 50 MG 24 hr tablet Take 1 tablet (50 mg total) by mouth in the morning and at bedtime.   montelukast (SINGULAIR) 10 MG tablet Take 10 mg by mouth daily.   Multiple Minerals (CALCIUM/MAGNESIUM/ZINC PO) Take 1 tablet by mouth daily.   Multiple Vitamin (DAILY VITES) tablet Take 1 tablet by mouth daily.   nitroGLYCERIN (NITROSTAT) 0.4 MG SL tablet Place 1 tablet (0.4 mg total) under the tongue every 5 (five) minutes x 3 doses as needed for chest pain.   venlafaxine XR (EFFEXOR-XR) 75 MG 24 hr capsule Take 1 capsule (75 mg total) by mouth daily. Future refills should be sent to PCP     Allergies:   Ondansetron  hcl and Short ragweed pollen ext   Social History   Socioeconomic History   Marital status: Married    Spouse name: Not on file   Number of children: Not on file   Years of education: Not on file   Highest education level: Not on file  Occupational History   Not on file  Tobacco Use   Smoking status: Never   Smokeless tobacco: Never  Vaping Use   Vaping Use: Every day  Substance and Sexual Activity   Alcohol use: Not Currently   Drug use: Yes    Types: Marijuana    Comment: Delta 8 vaping   Sexual activity: Not on file  Other Topics Concern   Not on file  Social History Narrative   Not on file   Social Determinants of Health   Financial Resource Strain: Not on file  Food Insecurity: Not on file  Transportation Needs: Not on file  Physical Activity: Not on file  Stress: Not on file  Social Connections: Not on file     Family History: The patient's family history includes Cancer in her mother; Factor V Leiden deficiency in an other family member; Hypertension in her brother and mother; Hypothyroidism in her mother; Stroke in her maternal grandmother; Thyroid disease in her brother.  ROS:   Please see the history of present illness.     All other systems reviewed and are negative.  EKGs/Labs/Other Studies Reviewed:    The following studies were reviewed today:   Heart monitor 07/2021 Patch Wear Time:  11 days and 0 hours (2023-06-06T10:10:57-0400 to 2023-06-17T10:27:56-0400)   Patient had a min HR of 48 bpm, max HR of 200 bpm, and avg HR of 71 bpm. Predominant underlying rhythm was Sinus Rhythm.  1 run of Ventricular Tachycardia occurred lasting 4 beats with a max rate of 162 bpm (avg 136 bpm).  26 Supraventricular Tachycardia runs occurred, the run with the fastest interval lasting 18 beats with a max rate of 200 bpm (avg 166 bpm); the run with the fastest interval was also the longest. Occasional PVCs with a burden of 5%.   Cardiac CTA 08/2021 IMPRESSION: 1.  Coronary calcium score of 101. This was 81st percentile for age and sex matched control.   2. Normal coronary origin with right dominance.   3. Mild proximal LAD stenosis (25%).   4. CAD-RADS 2. Mild non-obstructive CAD (25-49%). Consider non-atherosclerotic causes of chest pain. Consider preventive therapy and risk factor modification.  EKG:  EKG is ordered today.  The ekg ordered today demonstrates NSR, 64bpm, nonspecific T wave changes  Recent Labs: 08/07/2021: Magnesium 2.4 02/19/2022: ALT 19; BUN 16; Creatinine, Ser 0.77; Hemoglobin 12.4; Platelets 386; Potassium 3.9; Sodium 140; TSH 0.906  Recent Lipid Panel      Component Value Date/Time   CHOL 147 10/18/2021 1015   TRIG 48 10/18/2021 1015   HDL 62 10/18/2021 1015   CHOLHDL 2.4 10/18/2021 1015   VLDL 10 10/18/2021 1015   LDLCALC 75 10/18/2021 1015     Physical Exam:    VS:  BP 104/60 (BP Location: Left Arm, Patient Position: Sitting, Cuff Size: Normal)   Pulse 64   Ht 5' 6.5" (1.689 m)   Wt 109 lb 6 oz (49.6 kg)   SpO2 97%   BMI 17.39 kg/m     Wt Readings from Last 3 Encounters:  02/19/22 109 lb 6 oz (49.6 kg)  10/14/21 121 lb 3.2 oz (55 kg)  08/12/21 120 lb 2 oz (54.5 kg)     GEN:  Well nourished, well developed in no acute distress HEENT: Normal NECK: No JVD; No carotid bruits LYMPHATICS: No lymphadenopathy CARDIAC: RRR, no murmurs, rubs, gallops RESPIRATORY:  Clear to auscultation without rales, wheezing or rhonchi  ABDOMEN: Soft, non-tender, non-distended MUSCULOSKELETAL:  No edema; No deformity  SKIN: Warm and dry NEUROLOGIC:  Alert and oriented x 3 PSYCHIATRIC:  Normal affect   ASSESSMENT:    1. Coronary artery disease involving native heart without angina pectoris, unspecified vessel or lesion type   2. Non-ST elevation (NSTEMI) myocardial infarction (HCC)   3. Tachyarrhythmia   4. Essential hypertension   5. Fatigue, unspecified type   6. Weight loss    PLAN:    In order of problems  listed above:  Nonobstructive CAD Cardiac CTA in 09/10/2021 showed coronary calcium score of 101, 81st percentile for age and sex matched, mild LAD stenosis, mild nonobstructive CAD.  Patient denies any chest pain or shortness of breath.  Continue aspirin, Lipitor, Toprol.  HTN BP soft, also in the setting of weight loss.  Continue clonidine 0.1 mg twice daily and Toprol 50 mg daily.  I recommend she monitor her blood pressures at home and let us know if they become too low.  Tachyarrhythmia Patient denies palpitations.  Continue Toprol 50 mg daily.  HLD LDL 75.  Continue Lipitor 40 mg daily.  Fatigue Patient reports overall fatigue.  I will check c-Met, CBC, TSH.  Weight loss Weight is down 120lbs in July 2023 to 109lbs today. She reports this started after being started on Adderall.  She denies fevers or night sweats.  She does have occasional diarrhea. Some days appetite is good, some days appetite is poor.  She has been drinking Ensure and eating protein.  I recommended she discuss medication with PCP.  Disposition: Follow up in 4 month(s) with MD/APP    Signed, Cadence H Furth, PA-C  02/19/2022 4:40 PM    [facility]  

## 2022-03-03 ENCOUNTER — Inpatient Hospital Stay: Payer: MEDICARE | Attending: Gastroenterology

## 2022-03-03 MED ORDER — SODIUM CHLORIDE 0.9 % IV SOLN
0.9 % | INTRAVENOUS | Status: DC
Start: 2022-03-03 — End: 2022-03-03
  Administered 2022-03-03: 20:00:00 via INTRAVENOUS

## 2022-03-03 MED ORDER — LIDOCAINE HCL 2 % IJ SOLN (MIXTURES ONLY)
2 % | INTRAMUSCULAR | Status: DC | PRN
Start: 2022-03-03 — End: 2022-03-03
  Administered 2022-03-03: 20:00:00 40 via INTRAVENOUS

## 2022-03-03 MED ORDER — PROPOFOL 200 MG/20ML IV EMUL
200 MG/20ML | INTRAVENOUS | Status: DC | PRN
Start: 2022-03-03 — End: 2022-03-03
  Administered 2022-03-03: 20:00:00 100 via INTRAVENOUS
  Administered 2022-03-03 (×2): 50 via INTRAVENOUS

## 2022-03-03 MED FILL — SODIUM CHLORIDE 0.9 % IV SOLN: 0.9 % | INTRAVENOUS | Qty: 1000

## 2022-03-03 NOTE — Op Note (Signed)
[place] [facility]  Deno Lunger, M.D.  [phone]           03/03/2022                EGD Operative Report  Kristine Banks  DOB:  01/28/1956  [place] Medical Record Number:  [id number]      Indication: Dyphagia/odynophagia    Operator: Deno Lunger, MD    Referring Provider:  Laurann Montana, MD      Anesthesia/Sedation:  MAC anesthesia    Airway assessment: No airway problems anticipated    Pre-Procedural Exam:      Airway: clear, no airway problems anticipated  Heart: RRR, without gallops or rubs  Lungs: clear bilaterally without wheezes, crackles, or rhonchi  Abdomen: soft, nontender, nondistended, bowel sounds present  Mental Status: awake, alert and oriented to person, place and time       Procedure Details     After infomed consent was obtained for the procedure, with all risks and benefits of procedure explained the patient was taken to the endoscopy suite and placed in the left lateral decubitus position.  Following sequential administration of sedation as per above, the endoscope was inserted into the mouth and advanced under direct vision to second portion of the duodenum.  A careful inspection was made as the gastroscope was withdrawn, including a retroflexed view of the proximal stomach; findings and interventions are described below.      Findings:   Esophagus:Dry mucosa, no inflammation or lesions seen, no stricture or lesions seen.  Stomach: Hill grade IV hiatal hernia seen, about 3 to 4 cm in length, no inflammation or lesions seen.   Duodenum/jejunum: normal    Therapies:  esophageal dilation with savary sizes 51 Fr, no effective dilation seen    Specimens:  Duodenum           Complications:   None; patient tolerated the procedure well.    EBL:  None.           Impression:   Hiatal hernia, otherwise no stricture or lesions seen. Empiric dilation performed.     Recommendations:    -Continue acid suppression, will switch from Pepcid to pantoprazole daily  -Will need to follow strict  anti-reflux measures  -Follow-up office visit    Deno Lunger, MD

## 2022-03-03 NOTE — Progress Notes (Signed)
Endoscopy discharge instructions have been reviewed and given to patient.  The patient verbalized understanding and acceptance of instructions.      Dr. Eid discussed with patient and friend procedure findings and next steps.

## 2022-03-03 NOTE — Other (Signed)
Nursing report given to Ricky Ala, RN. Patient tolerated procedure without problems. Abdomen soft and patient arousable and voices no complaints Report received from Brett Fairy, CRNA, see anesthesia note. Patient transported to endoscopy recovery area. Scope precleaned at bedside by Jamse Arn, [place] .   Dr. Julieta Gutting did dilation with  savary sizes 33 Fr.   CRE balloon dilatation of the esophagus.  No subcutaneous crepitus of the chest or cervical region was noted post dilatation.

## 2022-03-03 NOTE — H&P (Signed)
The patient is a 66 year old female who presents with a complaint of Diarrhea. Note for "Diarrhea": Patient is a 66 yo female who presents today for follow up of lymphocytic colitis.    Diagnosed on colonoscopy in 10/2018. Has been treated with Uceris, colestid, and apriso.    Today, patient is doing well. She states after diagnosis of LC, she could not get Uceris/Budesonide because it was too expensive. She was treated with apriso and colestid and improved. She states she had been off those meds for a couple of months and started to have recurrent symptoms (diarrhea, urgency) about a week ago. She just restarted both meds and feeling well - only 1 BM a day.    She vapes and is on effexor. No PPI or NSAIDs.    02/24/22  Hx of lymphocytic colitis, though normal random biopsies on colonoscopy earlier this year. This was during hospitalization after she developed pain and diarrhea. CT suggested ascending colitis.    Last EGD 2020 - 4 cm hiatal hernia.    Today, she states she has continued to have loose stools though not typical of symptoms she had with her lymphocytic colitis previously.    Has some days with "rabbit turds" then sometimes will have leakage.    Has had progressive weight loss. Appetite is not great. Also having dysphagia.    Prior hx of ETOH use and smoking. Prior pancreatitis.      Problem List/Past Medical (Sianne C. Black; 02/24/2022 10:01 AM)  Pre-hypertension (R03.0)    Pancreatitis   2004  Diarrhea (R19.7)    Problems Reconciled      Past Surgical History (Sianne C. Black; 02/24/2022 10:10 AM)  Breast Augmentation    Repair of right leaky breast      Allergies (Sianne C. Black; 02/24/2022 10:02 AM)  No Known Drug Allergies   [10/19/2018]:  Allergies Reconciled      Medication History (Sianne C. Black; 02/24/2022 10:08 AM)  dextroamphetamine-amphetamine  (10mg  tablet, oral) Active.  amLODIPine  (10mg  tablet, oral) Active.  atorvastatin  (40mg  tablet, oral) Active.  montelukast  (10mg  tablet, oral)  Active.  cloNIDine HCL  (0.1mg  tablet, oral) Active.  metoprolol succinate  (50mg  Tablet, Extende, oral) Active.  venlafaxine  (75mg  Capsule, ER 24 , oral) Active.  Colestid  (1gram tablet, 3 (three) oral BID, Taken starting 12/12/2021) Active.  albuterol sulfate  (50mcg/actuat HFA Aerosol Inh, inhalation) Active.  ALPRAZolam  (1mg  tablet, oral) Active.  ascorbic acid (vitamin C)  (500mg  tablet, oral) Active.  famotidine  (20mg  tablet, oral) Active.  nitroglycerin  (0.4mg  Tablet, Subling, sublingual) Active.  Xifaxan  (550MG  tablet, 1 (one) Tablet Oral TID, Taken starting 11/09/2018) Active.  Budesonide ER  (9MG  Tablet, Delayed, 1 (one) Tablet Oral qdaily, Taken starting 11/16/2018) Active.  Apriso  (0.375GM Capsule, ER 24 , 4 Capsule Oral qdaily, Taken starting 11/18/2018) Active. (please switch to generic if brand is not covered)  Medications Reconciled     Family History (Sianne C. Black; 02/24/2022 10:08 AM)  Thyroid disease   Sister, Brother.  Hypertension   Sibling.    Social History (Sianne C. Black; 02/24/2022 10:08 AM)  Alcohol Use   Remotely quit alcohol use. quit in 2004  Blood Transfusion   No.  Employment status   Retired.  Marital status   Married.  Tobacco Use   E-cig user.    Health Maintenance History (Sianne C. Black; 02/24/2022 10:09 AM)  Flu Vaccine   [2023]:  Pneumovax   Refused.  COVID-19 vaccine   [2021]:        Review of Systems ([place]; 02/24/2022 10:00 AM)  General Not Present- Chronic Fatigue, Poor Appetite, Weight Gain and Weight Loss.  Skin Not Present- Itching, Rash and Skin Color Changes.  HEENT Not Present- Hearing Loss and Vertigo.  Respiratory Not Present- Difficulty Breathing and TB exposure.  Cardiovascular Not Present- Chest Pain, Use of Antibiotics before Dental Procedures and Use of Blood Thinners.  Gastrointestinal Present- See HPI.  Musculoskeletal Not Present- Arthritis, Hip Replacement Surgery and Knee Replacement Surgery.  Neurological Not Present-  Weakness.  Psychiatric Not Present- Depression.  Endocrine Not Present- Diabetes and Thyroid Problems.  Hematology Not Present- Anemia.    Vitals ([place]; 02/24/2022 10:12 AM)  02/24/2022 10:09 AM  Weight: 111.25 lb   Height: 66.5 in   Body Surface Area: 1.57 m   Body Mass Index: 17.69 kg/m    Temp.: 97 F  (Temporal)    Pulse: 50 (Regular)     BP: 144/76(Sitting, Left Arm, Standard)  Passed covid questions            Physical Exam (Joshua O. Storm PA; 02/24/2022 10:45 AM)  General  Mental Status - Alert.  General Appearance - Cooperative, Pleasant and Consistent with stated age, Not Anxious, Not in acute distress.  Orientation - Oriented X3.  Build & Nutrition - Well nourished and Well developed.    Chest and Lung Exam  Chest and lung exam reveals  - normal excursion with symmetric chest walls, quiet, even and easy respiratory effort with no use of accessory muscles and on auscultation, normal breath sounds, no adventitious sounds and normal vocal resonance.    Cardiovascular  Cardiovascular examination reveals  - normal heart sounds, regular rate and rhythm with no murmurs.    Abdomen  Inspection  Inspection of the abdomen reveals - Soft, Non-distended. Incisional scars - No incisional scars.  Palpation/Percussion  Tenderness - Non-Tender. Rebound tenderness - No rebound. Liver - No hepatosplenomegaly. Abdominal Mass Palpable - No masses. Other Characteristics - No Ascites.  Auscultation  Auscultation of the abdomen reveals - Bowel sounds normal.    Peripheral Vascular  Lower Extremity  Palpation - Edema - Bilateral - No edema - Bilateral.        Assessment & Plan Vonna Kotyk O. Storm PA; 02/24/2022 10:46 AM)    Dysphagia (R13.10)  Story: Last EGD 2020 - 4 cm hiatal hernia  Impression: Dysphagia along with poor appetite and weight loss.    Will proceed with EGD for further evaluation of dysphagia and to rule out esophagitis (reflux, EOE, candidal), stricture, ring, cancer, etc.    Fortunately had colonoscopy  and CT earlier this year.    Current Plans  EGD W/BIOPSY (69629)    Loose stools (R19.5)  Impression: Checking stool studies. ? if this is constipation with overflow based on her symptoms and diverticular disease. Hx of pancreatitis and prior ETOH/smoking history so checking for EPI - consider empiric pancreatic enzymes in future.    Recommended beginning daily fiber supplement with metamucil or citrucel (1-2 tablespoons a day).    Current Plans  Assay Of Calprotectin Fecal [id number])  ASSAY, ELASTASE, PANCREATIC, FECAL (32440)  Stool Profile- PCR (10272)      Signed electronically by Danna Hefty, PA (02/24/2022 10:47 AM)

## 2022-03-03 NOTE — Discharge Instructions (Addendum)
[place] [facility]  Deno Lunger, M.D.  [phone]           03/03/2022  Kristine Banks  DOB:  04/06/1955  Kristine Banks Medical Record Number:  [id number]        ENDOSCOPY FINDINGS:   Your endoscopy showed the hiatal hernia, otherwise no strictures or lesions seen. Empiric dilation was performed and biopsies obtained.      EGD DISCHARGE INSTRUCTIONS    DISCOMFORT:  Sore throat- throat lozenges or warm salt water gargle  redness at IV site- apply warm compress to area; if redness or soreness persist- contact your physician  Gaseous discomfort- walking, belching will help relieve any discomfort  You may not operate a vehicle for 12 hours  You may not engage in an occupation involving machinery or appliances for rest of today  You may not drink alcoholic beverages for at least 12 hours  Avoid making any critical decisions for at least 24 hour    DIET:   You may resume your regular diet.    ACTIVITY  Spend the remainder of the day resting -  avoid any strenuous activity. Avoid driving or operating machinery.    CALL M.D.  ANY SIGN OF   Increasing pain, nausea, vomiting  Abdominal distension (swelling)  New increased bleeding (oral or rectal)  Fever (chills)  Pain in chest area  Bloody discharge from nose or mouth  Shortness of breath    Follow-up Instructions:   Call Dr. Julieta Gutting for any questions or problems. Telephone # [phone]  Biopsies were obtained, the results will be available  in  5 to 7 days. We will call you to notify you of these results.   Will send a prescription of pantoprazole to your pharmacy for your to take daily in placed of Pepcid. Follow strict anti-reflux measures.  Follow up in the office if symptoms are persistent.

## 2022-03-03 NOTE — Progress Notes (Signed)
Kristine Banks  29-Sep-1955  [id number]    Situation:  Verbal report received from: Johnetta RN  Procedure: Procedure(s):  ESOPHAGOGASTRODUODENOSCOPY  DILATION, ESOPHAGUS  EGD BIOPSY    Background:    Preoperative diagnosis: Dysphagia, unspecified type [R13.10]  Postoperative diagnosis: * No post-op diagnosis entered *    Operator:  Dr. Julieta Gutting  Assistant(s): Circulator: Lytle Michaels, RN; Kermit Balo, RN    Specimens: @ORSPECMN @  H. Pylori  No    Assessment:  Intra-procedure medications   Anesthesia gave intra-procedure sedation and medications, see anesthesia flow sheet Yes    Intravenous fluids: NS@ KVO     Vital signs stable yes    Abdominal assessment: round and soft yes    Recommendation:  Discharge patient per MD order yes.  Family or Friend friend  Permission to share finding with family or friend Yes

## 2022-03-03 NOTE — Other (Signed)
Initial RN admission and assessment performed and documented in Endoscopy navigator.     Patient evaluated by anesthesia in pre-procedure holding.    All procedural vital signs, airway assessment, and level of consciousness information monitored and recorded by anesthesia staff on the anesthesia record.

## 2022-03-07 ENCOUNTER — Other Ambulatory Visit: Payer: Self-pay

## 2022-03-07 MED ORDER — METOPROLOL SUCCINATE ER 50 MG PO TB24: 50.0000 mg | ORAL_TABLET | Freq: Two times a day (BID) | ORAL | 0 refills | Status: DC

## 2022-03-25 ENCOUNTER — Inpatient Hospital Stay: Admit: 2022-03-25 | Payer: PRIVATE HEALTH INSURANCE | Primary: Family Medicine

## 2022-03-25 DIAGNOSIS — R9389 Abnormal findings on diagnostic imaging of other specified body structures: Secondary | ICD-10-CM

## 2022-04-22 ENCOUNTER — Other Ambulatory Visit: Payer: Self-pay | Admitting: *Deleted

## 2022-04-22 MED ORDER — METOPROLOL SUCCINATE ER 50 MG PO TB24: 50.0000 mg | ORAL_TABLET | Freq: Two times a day (BID) | ORAL | 0 refills | Status: DC

## 2022-05-29 ENCOUNTER — Emergency Department
Admission: EM | Admit: 2022-05-29 | Discharge: 2022-05-30 | Disposition: A | Discharge status: Home / Self Care | Payer: Medicare Other | Attending: Emergency Medicine | Admitting: Emergency Medicine

## 2022-05-29 DIAGNOSIS — R45851 Suicidal ideations: Secondary | ICD-10-CM | POA: Diagnosis not present

## 2022-05-29 DIAGNOSIS — Z1152 Encounter for screening for COVID-19: Secondary | ICD-10-CM | POA: Diagnosis not present

## 2022-05-29 DIAGNOSIS — F4325 Adjustment disorder with mixed disturbance of emotions and conduct: Secondary | ICD-10-CM | POA: Insufficient documentation

## 2022-05-29 DIAGNOSIS — F32A Depression, unspecified: Secondary | ICD-10-CM | POA: Insufficient documentation

## 2022-05-29 DIAGNOSIS — F430 Acute stress reaction: Secondary | ICD-10-CM | POA: Insufficient documentation

## 2022-05-29 LAB — SALICYLATE LEVEL: Salicylate Lvl: <7 mg/dL — ABNORMAL LOW (ref 7.0–30.0)

## 2022-05-29 LAB — RESP PANEL BY RT-PCR (RSV, FLU A&B, COVID)  RVPGX2
Influenza A by PCR: NEGATIVE
Influenza B by PCR: NEGATIVE
Resp Syncytial Virus by PCR: NEGATIVE
SARS Coronavirus 2 by RT PCR: NEGATIVE

## 2022-05-29 LAB — CBC
HCT: 40.1 % (ref 36.0–46.0)
Hemoglobin: 13.4 g/dL (ref 12.0–15.0)
MCH: 30.2 pg (ref 26.0–34.0)
MCHC: 33.4 g/dL (ref 30.0–36.0)
MCV: 90.3 fL (ref 80.0–100.0)
Platelets: 436 10*3/uL — ABNORMAL HIGH (ref 150–400)
RBC: 4.44 MIL/uL (ref 3.87–5.11)
RDW: 13.2 % (ref 11.5–15.5)
WBC: 12.9 10*3/uL — ABNORMAL HIGH (ref 4.0–10.5)
nRBC: 0 % (ref 0.0–0.2)

## 2022-05-29 LAB — COMPREHENSIVE METABOLIC PANEL
ALT: 24 U/L (ref 0–44)
AST: 32 U/L (ref 15–41)
Albumin: 4.2 g/dL (ref 3.5–5.0)
Alkaline Phosphatase: 98 U/L (ref 38–126)
Anion gap: 11 (ref 5–15)
BUN: 17 mg/dL (ref 8–23)
CO2: 24 mmol/L (ref 22–32)
Calcium: 9.5 mg/dL (ref 8.9–10.3)
Chloride: 102 mmol/L (ref 98–111)
Creatinine, Ser: 1.02 mg/dL — ABNORMAL HIGH (ref 0.44–1.00)
GFR, Estimated: >60 mL/min (ref >60)
Glucose, Bld: 129 mg/dL — ABNORMAL HIGH (ref 70–99)
Potassium: 3.9 mmol/L (ref 3.5–5.1)
Sodium: 137 mmol/L (ref 135–145)
Total Bilirubin: 0.9 mg/dL (ref 0.3–1.2)
Total Protein: 8.1 g/dL (ref 6.5–8.1)

## 2022-05-29 LAB — ETHANOL: Alcohol, Ethyl (B): <10 mg/dL (ref <10)

## 2022-05-29 LAB — ACETAMINOPHEN LEVEL: Acetaminophen (Tylenol), Serum: <10 ug/mL — ABNORMAL LOW (ref 10–30)

## 2022-05-29 NOTE — ED Notes (Addendum)
Patients husband called and said he was informed by [employer] his wife was in our ED. I asked the patient if she would like to talk to him and she said "its not my husband my husband does not know I'm here you're lying and asked what his name was." I then grabbed the phone and asked the husband what his name was and replied with his name." Patient then said "Do not tell him a thing or I will sue all of you and then said fine ill talk to him. She then proceeded to say "yeah I'm here I'm in the nut house and no I do not want you here and if you do come to visit or ill divorce you." And proceeded to throw the phone. I then talked to the husband and told him I was unable to give him much information especially since she just told me she wants you to know nothing. I also told him the visiting hours if he wanted to come and visit but she could deny seeing him.

## 2022-05-29 NOTE — Consult Note (Signed)
[employer] Psychiatry Consult   Reason for Consult:  IVC for making suicidal statements Referring Physician:  Dr. Archie Balboa Patient Identification: Veronica Warner MRN:  [id number] Principal Diagnosis: Suicidal ideation Diagnosis:  Principal Problem:   Suicidal ideation   Total Time spent with patient: 45 minutes  Subjective:  "My boyfriend died and I'm very upset." Veronica Warner is a 67 y.o. female patient admitted with expression of suicidal statements.  HPI: Patient presents to ED under IVC with [place] PD after making suicidal statement while trying to gain access to her deceased boyfriend's house.   On evaluation, patient is very irritable and upset. Patient tends to be tangential and difficult to focus on questions. She will talk about details that are irrelevant to the situation. She was resistant to interview and very angry at the situation stating that she was only trying to gain entry to get her personal belongings. Patient denies what is written the IVC paperwork. Patient states she was "patiently waiting in car" for her boyfriend's brother arrival to get her belongings. Patient states the brother called the police to escort her around the home.  Patient denies suicidal ideations and previous attempts. Perception seems to be intact. 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 BAL < 10. UDS pending.  Patient refuses to give [place] permission to speak to her husband or daughters because they do not know about the relationship she had with the deceased person (boyfriend). Patient is refusing to give any PTA medications to pharmacy for reconciliation.  Patient's B/P was 206/140 on admission. When we asked patient to let us retake her B/P around 1745, patient stated she didn't care if "it was stroke level", she would not take her medications. Repeat later 153/75. Will hold patient overnight and reassess in the morning  as she is very irritable and we are unable to get an accurate assessment at this time.   Past Psychiatric History: patient denies previous psych hospitalizations. She states she sees a therapist and is on Effexor.    Risk to Self:   Risk to Others:   Prior Inpatient Therapy:   Prior Outpatient Therapy:    Past Medical History:  Past Medical History:  Diagnosis Date   Coronary artery disease    Hypertension    Metabolic acidosis, increased anion gap (IAG) 08/05/2021   Pancreatitis     Past Surgical History:  Procedure Laterality Date   CARDIAC CATHETERIZATION     2021 no stents   CHOLECYSTECTOMY     Family History:  Family History  Problem Relation Age of Onset   Hypertension Mother    Cancer Mother    Hypothyroidism Mother    Thyroid disease Brother    Hypertension Brother    Stroke Maternal Grandmother    Factor V Leiden deficiency Other    Family Psychiatric History: None reported  Social History:  Social History   Substance and Sexual Activity  Alcohol Use Not Currently     Social History   Substance and Sexual Activity  Drug Use Yes   Types: Marijuana   Comment: Delta 8 vaping    Social History   Socioeconomic History   Marital status: Married    Spouse name: Not on file   Number of children: Not on file   Years of education: Not on file   Highest education level: Not on file  Occupational History   Not on file  Tobacco Use  Smoking status: Never   Smokeless tobacco: Never  Vaping Use   Vaping Use: Every day  Substance and Sexual Activity   Alcohol use: Not Currently   Drug use: Yes    Types: Marijuana    Comment: Delta 8 vaping   Sexual activity: Not on file  Other Topics Concern   Not on file  Social History Narrative   Not on file   Social Determinants of Health   Financial Resource Strain: Not on file  Food Insecurity: Not on file  Transportation Needs: Not on file  Physical Activity: Not on file  Stress: Not on file  Social  Connections: Not on file   Additional Social History:    Allergies:   Allergies  Allergen Reactions   Ondansetron Hcl Other (See Comments)    Other reaction(s): Other (comments) Medication doesn't work for her per patient   Short Ragweed Pollen Ext     Other reaction(s): Sneezing    Labs:  Results for orders placed or performed during the hospital encounter of 05/29/22 (from the past 48 hour(s))  Comprehensive metabolic panel     Status: Abnormal   Collection Time: 05/29/22  2:53 PM  Result Value Ref Range   Sodium 137 135 - 145 mmol/L   Potassium 3.9 3.5 - 5.1 mmol/L   Chloride 102 98 - 111 mmol/L   CO2 24 22 - 32 mmol/L   Glucose, Bld 129 (H) 70 - 99 mg/dL    Comment: Glucose reference range applies only to samples taken after fasting for at least 8 hours.   BUN 17 8 - 23 mg/dL   Creatinine, Ser 1.02 (H) 0.44 - 1.00 mg/dL   Calcium 9.5 8.9 - 10.3 mg/dL   Total Protein 8.1 6.5 - 8.1 g/dL   Albumin 4.2 3.5 - 5.0 g/dL   AST 32 15 - 41 U/L   ALT 24 0 - 44 U/L   Alkaline Phosphatase 98 38 - 126 U/L   Total Bilirubin 0.9 0.3 - 1.2 mg/dL   GFR, Estimated >60 >60 mL/min    Comment: (NOTE) Calculated using the CKD-EPI Creatinine Equation (2021)    Anion gap 11 5 - 15    Comment: Performed at [facility], [address] [place], [place] [address]  Ethanol     Status: None   Collection Time: 05/29/22  2:53 PM  Result Value Ref Range   Alcohol, Ethyl (B) <10 <10 mg/dL    Comment: (NOTE) Lowest detectable limit for serum alcohol is 10 mg/dL.  For medical purposes only. Performed at [facility], [address] [place], [place] [id number]   Salicylate level     Status: Abnormal   Collection Time: 05/29/22  2:53 PM  Result Value Ref Range   Salicylate Lvl Q000111Q (L) 7.0 - 30.0 mg/dL    Comment: Performed at [facility], [address] [place], [place] [address]  Acetaminophen level     Status: Abnormal   Collection Time: 05/29/22  2:53 PM   Result Value Ref Range   Acetaminophen (Tylenol), Serum <10 (L) 10 - 30 ug/mL    Comment: (NOTE) Therapeutic concentrations vary significantly. A range of 10-30 ug/mL  may be an effective concentration for many patients. However, some  are best treated at concentrations outside of this range. Acetaminophen concentrations >150 ug/mL at 4 hours after ingestion  and >50 ug/mL at 12 hours after ingestion are often associated with  toxic reactions.  Performed at [facility], [phone] [place], [place] [address]   cbc     Status: Abnormal   Collection Time: 05/29/22  2:53 PM  Result Value Ref Range   WBC 12.9 (H) 4.0 - 10.5 K/uL   RBC 4.44 3.87 - 5.11 MIL/uL   Hemoglobin 13.4 12.0 - 15.0 g/dL   HCT 40.1 36.0 - 46.0 %   MCV 90.3 80.0 - 100.0 fL   MCH 30.2 26.0 - 34.0 pg   MCHC 33.4 30.0 - 36.0 g/dL   RDW 13.2 11.5 - 15.5 %   Platelets 436 (H) 150 - 400 K/uL   nRBC 0.0 0.0 - 0.2 %    Comment: Performed at [facility], [address] [place], [place] [address]    No current facility-administered medications for this encounter.   Current Outpatient Medications  Medication Sig Dispense Refill   hydrOXYzine (ATARAX) 25 MG tablet Take 25-50 mg by mouth daily as needed.     ondansetron (ZOFRAN-ODT) 4 MG disintegrating tablet Take 4 mg by mouth every 8 (eight) hours.     pantoprazole (PROTONIX) 40 MG tablet Take 40 mg by mouth daily.     ADDERALL 10 MG tablet Take 10 mg by mouth daily with breakfast.     albuterol (VENTOLIN HFA) 108 (90 Base) MCG/ACT inhaler Inhale 2 puffs into the lungs every 6 (six) hours as needed for wheezing or shortness of breath.     ALPRAZolam (XANAX) 1 MG tablet Take 1 tablet (1 mg total) by mouth 2 (two) times daily as needed for anxiety. 10 tablet 0   amLODipine (NORVASC) 10 MG tablet Take 10 mg by mouth daily.     ascorbic acid (VITAMIN C) 500 MG tablet Take 4,000 mg by mouth in the morning.     aspirin EC 81 MG tablet Take 81 mg  by mouth daily. Swallow whole.     atorvastatin (LIPITOR) 40 MG tablet Take 1 tablet (40 mg total) by mouth daily. 90 tablet 3   cloNIDine (CATAPRES) 0.1 MG tablet Take 0.1 mg by mouth 2 (two) times daily.     colestipol (COLESTID) 1 g tablet Take 2 g by mouth daily as needed.     famotidine (PEPCID) 20 MG tablet Take 20 mg by mouth daily.     metoprolol succinate (TOPROL-XL) 50 MG 24 hr tablet Take 1 tablet (50 mg total) by mouth in the morning and at bedtime. 180 tablet 0   montelukast (SINGULAIR) 10 MG tablet Take 10 mg by mouth daily.     Multiple Vitamin (DAILY VITES) tablet Take 1 tablet by mouth daily.     nitroGLYCERIN (NITROSTAT) 0.4 MG SL tablet Place 1 tablet (0.4 mg total) under the tongue every 5 (five) minutes x 3 doses as needed for chest pain. 20 tablet 0   venlafaxine XR (EFFEXOR-XR) 75 MG 24 hr capsule Take 1 capsule (75 mg total) by mouth daily. Future refills should be sent to PCP 15 capsule 0    Musculoskeletal: Strength & Muscle Tone: within normal limits Gait & Station: normal Patient leans: N/A  Psychiatric Specialty Exam:  Presentation  General Appearance: Bizarre  Eye Contact:Fair  Speech:Clear and Coherent  Speech Volume:Normal  Handedness:No data recorded  Mood and Affect  Mood:Angry; Irritable  Affect:Congruent   Thought Process  Thought Processes:Coherent  Descriptions of Associations:Tangential  Orientation:Full (Time, Place and Person)  Thought Content:WDL  History of Schizophrenia/Schizoaffective disorder:No data recorded Duration of Psychotic Symptoms:No data recorded Hallucinations:Hallucinations: None  Ideas of Reference:None  Suicidal  Thoughts:Suicidal Thoughts: No  Homicidal Thoughts:Homicidal Thoughts: No   Sensorium  Memory:Immediate Good  Judgment:Poor  Insight:Poor   Executive Functions  Concentration:Fair  Attention Span:Fair  [place]   Psychomotor Activity   Psychomotor Activity:Psychomotor Activity: Normal   Assets  Assets:Social Support; Housing; Transportation   Sleep  Sleep:No data recorded  Physical Exam: Physical Exam Vitals and nursing note reviewed.  HENT:     Head: Normocephalic.     Nose: No congestion or rhinorrhea.  Eyes:     General:        Right eye: No discharge.        Left eye: No discharge.  Musculoskeletal:        General: Normal range of motion.  Neurological:     Mental Status: She is alert and oriented to person, place, and time.  Psychiatric:        Attention and Perception: She is inattentive.        Mood and Affect: Affect is labile, blunt and tearful.        Speech: Speech normal.        Behavior: Behavior is uncooperative.        Cognition and Memory: Cognition normal.        Judgment: Judgment is impulsive.    Review of Systems  Constitutional: Negative.   HENT: Negative.    Respiratory: Negative.    Skin: Negative.   Psychiatric/Behavioral:  Positive for depression and substance abuse (Marijuana). Negative for hallucinations and suicidal ideas. The patient is not nervous/anxious.    Blood pressure (!) 153/75, pulse 81, temperature 97.9 F (36.6 C), temperature source Oral, resp. rate 18, weight 56.7 kg, SpO2 100 %. Body mass index is 19.87 kg/m.  Treatment Plan Summary: Daily contact with patient to assess and evaluate symptoms and progress in treatment and Plan Observe overnight and reassess patient when she is not as irritable and amenable to questions to get an adequate psych assessment. Reviewed with EDP.   Disposition:  Observe overnight and reassess patient when she is not as irritable and amenable to questions to get an adequate psych assessment. Reviewed with EDP.   Sherlon Handing, NP 05/29/2022 6:08 PM

## 2022-05-29 NOTE — ED Provider Notes (Signed)
[facility] Provider Note    Event Date/Time   First MD Initiated Contact with Patient 05/29/22 1513     (approximate)   History   Psychiatric Evaluation   HPI  Veronica Warner is a 67 y.o. female  who presents to the emergency department brought in by police under IVC. She states that her boyfriend died yesterday. She went to collect some of her stuff from his house but his brother was not letting her. When police got there she stated she was upset and that she said she wanted to kill herself. She denies any acute medical complaints.        Physical Exam   Triage Vital Signs: ED Triage Vitals  Enc Vitals Group     BP 05/29/22 1448 (!) 206/140     Pulse Rate 05/29/22 1448 (!) 107     Resp --      Temp 05/29/22 1448 97.9 F (36.6 C)     Temp Source 05/29/22 1448 Oral     SpO2 05/29/22 1448 100 %     Weight 05/29/22 1451 125 lb (56.7 kg)     Height --      Head Circumference --      Peak Flow --      Pain Score --      Pain Loc --      Pain Edu? --      Excl. in [place]? --     Most recent vital signs: Vitals:   05/29/22 1448  BP: (!) 206/140  Pulse: (!) 107  Temp: 97.9 F (36.6 C)  SpO2: 100%   General: Awake, alert, oriented. CV:  Good peripheral perfusion.  Resp:  Normal effort.  Abd:  No distention.  Other:  Tearful.   ED Results / Procedures / Treatments   Labs (all labs ordered are listed, but only abnormal results are displayed) Labs Reviewed  COMPREHENSIVE METABOLIC PANEL - Abnormal; Notable for the following components:      Result Value   Glucose, Bld 129 (*)    Creatinine, Ser 1.02 (*)    All other components within normal limits  CBC - Abnormal; Notable for the following components:   WBC 12.9 (*)    Platelets 436 (*)    All other components within normal limits  ETHANOL  SALICYLATE LEVEL  ACETAMINOPHEN LEVEL  URINE DRUG SCREEN, QUALITATIVE ([facility] ONLY)      EKG  None   RADIOLOGY None   PROCEDURES:  Critical Care performed: No    MEDICATIONS ORDERED IN ED: Medications - No data to display   IMPRESSION / MDM / [place] / ED COURSE  I reviewed the triage vital signs and the nursing notes.                              Differential diagnosis includes, but is not limited to, grief reaction, SI  Patient's presentation is most consistent with acute presentation with potential threat to life or bodily function.  Patient presents to the emergency department under IVC because of threatening SI. On exam patient is tearful. Will have psychiatry evaluate.  The patient has been placed in psychiatric observation due to the need to provide a safe environment for the patient while obtaining psychiatric consultation and evaluation, as well as ongoing medical and medication management to treat the patient's condition.  The patient has been placed under full IVC at this time.  Psych saw patient and would like to reassess in the morning.   FINAL CLINICAL IMPRESSION(S) / ED DIAGNOSES   Final diagnoses:  Depression, unspecified depression type      Note:  This document was prepared using Dragon voice recognition software and may include unintentional dictation errors.    Nance Pear, MD 05/29/22 [phone]

## 2022-05-29 NOTE — ED Notes (Addendum)
Patient items:   1 pink shirt 2 pink socks 2 colorful shoes 1 pair of blue pants 1 pair of blue underwear 1 pink bra 1 white watch 2 white bracelets 1 brown bracelet 1 colorful bracelet 1 colorful ring 4 white rings 1 hair tie 1 pink purse

## 2022-05-29 NOTE — ED Notes (Signed)
Attempted to talk to patient and ask if she needed anything. She responded with "I want my purse and my vape." I informed her I could not give her any of her items and they are locked up right now." She then stated "I am of no use to her and asked when the doctor would be coming in to talk to her because she needs to get out of here ASAP and see her husband he will be concerned when she doesn't come home since he is expecting her to come home tonight and does not know she is here." I let her know she would be able to call him from [phone] during phone hours and she declined and "The police can notify him and deal with it when he calls them." This RN was informed by triage nurse [employer] did inform patients husband she is here.

## 2022-05-29 NOTE — ED Notes (Addendum)
Pt came to ED room screaming and yelling at [employer], all the staff, and agitated and combative. Did not want to give [place] any of her belongings. Then completely stripped and changed into purple scrubs.

## 2022-05-29 NOTE — ED Triage Notes (Addendum)
Pt arrived to ED via [facility]. Per IVC paper work, pt boyfriend died last night and she was at his house attmepting to collect her Dildo's, sex toys, lingerie and marijuana. When PD arrived on scene pt was screaming and yelling. Pt was told by PD that she needed to wait for the other resident of the home to allow her access to the house. Pt than started to walk away and sts that she was just going to kill herself. Pt is yelling and screaming at staff while in triage. Pt sts all I need to go is go back to [employer] to my husband. RN attempted to talk to pt about the process and sts " Quit talking to me or you will get bit. Just leave me alone."

## 2022-05-29 NOTE — ED Notes (Signed)
INVOLUNTARY will observe overnight and reasses

## 2022-05-29 NOTE — ED Notes (Signed)
Pt. To BHU from ED ambulatory without difficulty, to room  BHU 4. Report from [facility]. Pt. Is alert and oriented, warm and dry in no distress. Pt. Denies SI, HI, and AVH. Pt. Calm and cooperative. Pt. Made aware of security cameras and Q15 minute rounds. Pt. Encouraged to let Nursing staff know of any concerns or needs.    ENVIRONMENTAL ASSESSMENT Potentially harmful objects out of patient reach: Yes.   Personal belongings secured: Yes.   Patient dressed in hospital provided attire only: Yes.   Plastic bags out of patient reach: Yes.   Patient care equipment (cords, cables, call bells, lines, and drains) shortened, removed, or accounted for: Yes.   Equipment and supplies removed from bottom of stretcher: Yes.   Potentially toxic materials out of patient reach: Yes.   Sharps container removed or out of patient reach: Yes.

## 2022-05-29 NOTE — ED Notes (Signed)
NP did talk with patient. Patient was unable to give a urine at this time. When she walked out of bathroom walked into room and threw urine cup at the wall. Took the urine cup and told her she could try to give a sample later. Continued to talk with patient and she said "if you want someone to commit suicide this is where you would put them in four walls with nothing to do" I asked patient if she wanted anything to drink. And informed her dietary was called and a meal tray should be here soon for her. She stated she wanted water but nothing to eat. Also turned the television on for patient, gave her a ice water and warm blanket.

## 2022-05-29 NOTE — ED Notes (Signed)
INVOLUNTARY with all papers on chart/awaiting TTS/PSYCH consult ?

## 2022-05-30 DIAGNOSIS — F430 Acute stress reaction: Secondary | ICD-10-CM

## 2022-05-30 MED ORDER — METOPROLOL SUCCINATE ER 50 MG PO TB24
50.0000 mg | ORAL_TABLET | Freq: Every day | ORAL | Status: DC
  Administered 2022-05-30: 50 mg via ORAL
  Filled 2022-05-30: qty 1

## 2022-05-30 MED ORDER — ALPRAZOLAM 1 MG PO TABS: 1.0000 mg | ORAL_TABLET | Freq: Two times a day (BID) | ORAL | Status: DC | PRN

## 2022-05-30 MED ORDER — AMLODIPINE BESYLATE 5 MG PO TABS
10.0000 mg | ORAL_TABLET | Freq: Every day | ORAL | Status: DC
  Administered 2022-05-30: 10 mg via ORAL
  Filled 2022-05-30: qty 2

## 2022-05-30 MED ORDER — VENLAFAXINE HCL ER 75 MG PO CP24
75.0000 mg | ORAL_CAPSULE | Freq: Every day | ORAL | Status: DC
  Administered 2022-05-30: 75 mg via ORAL
  Filled 2022-05-30: qty 1

## 2022-05-30 MED ORDER — CLONIDINE HCL 0.1 MG PO TABS
0.1000 mg | ORAL_TABLET | Freq: Two times a day (BID) | ORAL | Status: DC
  Administered 2022-05-30: 0.1 mg via ORAL
  Filled 2022-05-30: qty 1

## 2022-05-30 NOTE — ED Notes (Signed)
Snack provided with water

## 2022-05-30 NOTE — ED Notes (Addendum)
Per husband daughter will come to get pt at around 3 pm. Instructions to entrance and discharge process provided. Pt informed of discharge plan. Pt reports she is supposed to take 150 mg of Effexor.

## 2022-05-30 NOTE — ED Notes (Signed)
Pt given lunch

## 2022-05-30 NOTE — ED Notes (Signed)
Pt reports nobody was called to pick her up yet. Pt attempted to call her friend for ride at this time w/ assistance w/out answer. Pt called husband- husband states he is over two hours away and will try to arrange ride with daughter who is in [place]. Husband given direct number for call-back to confirm.

## 2022-05-30 NOTE — ED Provider Notes (Signed)
-----------------------------------------   11:16 AM on 05/30/2022 ----------------------------------------- Patient has been seen and evaluated by psychiatry.  They believe the patient is safe for discharge home from a psychiatric standpoint.  Patient's medical workup has been largely nonrevealing with a normal chemistry and negative COVID/flu/RSV, negative acetaminophen salicylate and alcohol level, reassuring CBC which is a slight leukocytosis.  Patient will be discharged with outpatient resources.   Harvest Dark, MD 05/30/22 [phone]

## 2022-05-30 NOTE — ED Notes (Signed)
Per previous [employer] ride (pt's husband) en route for imminent discharge.

## 2022-05-30 NOTE — ED Notes (Signed)
IVC/Obs./Reasses

## 2022-05-30 NOTE — Consult Note (Signed)
[facility] Consult   Reason for Consult:  Suicidal Ideation Referring Physician:  EDP Patient Identification: Veronica Warner MRN:  [id number] Principal Diagnosis: Stress reaction causing mixed disturbance of emotion and conduct Diagnosis:  Principal Problem:   Stress reaction causing mixed disturbance of emotion and conduct Active Problems:   Suicidal ideation   Total Time spent with patient: 30 minutes  Subjective:   Veronica Warner is a 67 y.o. female patient admitted after in the presence of grieving her boyfriend who recently passed away on 19-Jun-2022.  HPI: Lekiesha states that she went to her deceased boyfriend's Jeneen Rinks) house to retrieve her belongings after learning that her boyfriend had passed. She states that she was sitting in her car in front of Jeneen Rinks' home where she was "waiting for Jeneen Rinks' cousin to come with the garage opener," where she also reports "talking to his neighbors and landscapers." She states that the police came and brought her to the ED while she was waiting. She reports that she would just like to grieve the death of Jeneen Rinks and wants "someone to hold me, rather than being in this room by myself." She denies suicidal ideations, homicidal ideations, and auditory/visual hallucinations.   Collateral information obtained with her permission and her husband, Roderic Palau, is willing to come and get her to return home as he has no safety concerns, denies having guns in the home or patient access.  She did not want him to drive the [id number] hours here and will call a friend so she can retrieve her car.  Past Psychiatric History: Suicidal ideation  Risk to Self:  None. Risk to Others: None.   Prior Inpatient Therapy: none Prior Outpatient Therapy: none  Past Medical History:  Past Medical History:  Diagnosis Date   Coronary artery disease    Hypertension    Metabolic acidosis, increased anion gap (IAG) 08/05/2021   Pancreatitis     Past  Surgical History:  Procedure Laterality Date   CARDIAC CATHETERIZATION     2021 no stents   CHOLECYSTECTOMY     Family History:  Family History  Problem Relation Age of Onset   Hypertension Mother    Cancer Mother    Hypothyroidism Mother    Thyroid disease Brother    Hypertension Brother    Stroke Maternal Grandmother    Factor V Leiden deficiency Other    Family Psychiatric  History: UNK Social History:  Social History   Substance and Sexual Activity  Alcohol Use Not Currently     Social History   Substance and Sexual Activity  Drug Use Yes   Types: Marijuana   Comment: Delta 8 vaping    Social History   Socioeconomic History   Marital status: Married    Spouse name: Not on file   Number of children: Not on file   Years of education: Not on file   Highest education level: Not on file  Occupational History   Not on file  Tobacco Use   Smoking status: Never   Smokeless tobacco: Never  Vaping Use   Vaping Use: Every day  Substance and Sexual Activity   Alcohol use: Not Currently   Drug use: Yes    Types: Marijuana    Comment: Delta 8 vaping   Sexual activity: Not on file  Other Topics Concern   Not on file  Social History Narrative   Not on file   Social Determinants of Health   Financial Resource Strain: Not on file  Food Insecurity: Not on file  Transportation Needs: Not on file  Physical Activity: Not on file  Stress: Not on file  Social Connections: Not on file   Additional Social History:    Allergies:   Allergies  Allergen Reactions   Ondansetron Hcl Other (See Comments)    Other reaction(s): Other (comments) Medication doesn't work for her per patient   Short Ragweed Pollen Ext     Other reaction(s): Sneezing    Labs:  Results for orders placed or performed during the hospital encounter of 05/29/22 (from the past 48 hour(s))  Comprehensive metabolic panel     Status: Abnormal   Collection Time: 05/29/22  2:53 PM  Result Value  Ref Range   Sodium 137 135 - 145 mmol/L   Potassium 3.9 3.5 - 5.1 mmol/L   Chloride 102 98 - 111 mmol/L   CO2 24 22 - 32 mmol/L   Glucose, Bld 129 (H) 70 - 99 mg/dL    Comment: Glucose reference range applies only to samples taken after fasting for at least 8 hours.   BUN 17 8 - 23 mg/dL   Creatinine, Ser 1.02 (H) 0.44 - 1.00 mg/dL   Calcium 9.5 8.9 - 10.3 mg/dL   Total Protein 8.1 6.5 - 8.1 g/dL   Albumin 4.2 3.5 - 5.0 g/dL   AST 32 15 - 41 U/L   ALT 24 0 - 44 U/L   Alkaline Phosphatase 98 38 - 126 U/L   Total Bilirubin 0.9 0.3 - 1.2 mg/dL   GFR, Estimated >60 >60 mL/min    Comment: (NOTE) Calculated using the CKD-EPI Creatinine Equation (2021)    Anion gap 11 5 - 15    Comment: Performed at [facility], [address] [place], [place] [address]  Ethanol     Status: None   Collection Time: 05/29/22  2:53 PM  Result Value Ref Range   Alcohol, Ethyl (B) <10 <10 mg/dL    Comment: (NOTE) Lowest detectable limit for serum alcohol is 10 mg/dL.  For medical purposes only. Performed at [facility], [address] [place], [place] [id number]   Salicylate level     Status: Abnormal   Collection Time: 05/29/22  2:53 PM  Result Value Ref Range   Salicylate Lvl Q000111Q (L) 7.0 - 30.0 mg/dL    Comment: Performed at [facility], [address] [place], [place] [address]  Acetaminophen level     Status: Abnormal   Collection Time: 05/29/22  2:53 PM  Result Value Ref Range   Acetaminophen (Tylenol), Serum <10 (L) 10 - 30 ug/mL    Comment: (NOTE) Therapeutic concentrations vary significantly. A range of 10-30 ug/mL  may be an effective concentration for many patients. However, some  are best treated at concentrations outside of this range. Acetaminophen concentrations >150 ug/mL at 4 hours after ingestion  and >50 ug/mL at 12 hours after ingestion are often associated with  toxic reactions.  Performed at [facility], [address]  [place], [place] [address]   cbc     Status: Abnormal   Collection Time: 05/29/22  2:53 PM  Result Value Ref Range   WBC 12.9 (H) 4.0 - 10.5 K/uL   RBC 4.44 3.87 - 5.11 MIL/uL   Hemoglobin 13.4 12.0 - 15.0 g/dL   HCT 40.1 36.0 - 46.0 %   MCV 90.3 80.0 - 100.0 fL   MCH 30.2 26.0 - 34.0 pg   MCHC 33.4 30.0 - 36.0 g/dL  RDW 13.2 11.5 - 15.5 %   Platelets 436 (H) 150 - 400 K/uL   nRBC 0.0 0.0 - 0.2 %    Comment: Performed at [facility], [address] [place], [place] [address]  Resp panel by RT-PCR (RSV, Flu A&B, Covid) Anterior Nasal Swab     Status: None   Collection Time: 05/29/22 11:04 PM   Specimen: Anterior Nasal Swab  Result Value Ref Range   SARS Coronavirus 2 by RT PCR NEGATIVE NEGATIVE    Comment: (NOTE) SARS-CoV-2 target nucleic acids are NOT DETECTED.  The SARS-CoV-2 RNA is generally detectable in upper respiratory specimens during the acute phase of infection. The lowest concentration of SARS-CoV-2 viral copies this assay can detect is 138 copies/mL. A negative result does not preclude SARS-Cov-2 infection and should not be used as the sole basis for treatment or other patient management decisions. A negative result may occur with  improper specimen collection/handling, submission of specimen other than nasopharyngeal swab, presence of viral mutation(s) within the areas targeted by this assay, and inadequate number of viral copies(<138 copies/mL). A negative result must be combined with clinical observations, patient history, and epidemiological information. The expected result is Negative.  Fact Sheet for Patients:  EntrepreneurPulse.com.au  Fact Sheet for Healthcare Providers:  IncredibleEmployment.be  This test is no t yet approved or cleared by the [place] FDA and  has been authorized for detection and/or diagnosis of SARS-CoV-2 by FDA under an Emergency Use Authorization (EUA). This EUA will remain  in  effect (meaning this test can be used) for the duration of the COVID-19 declaration under Section 564(b)(1) of the Act, 21 U.S.C.section 360bbb-3(b)(1), unless the authorization is terminated  or revoked sooner.       Influenza A by PCR NEGATIVE NEGATIVE   Influenza B by PCR NEGATIVE NEGATIVE    Comment: (NOTE) The Xpert Xpress SARS-CoV-2/FLU/RSV plus assay is intended as an aid in the diagnosis of influenza from Nasopharyngeal swab specimens and should not be used as a sole basis for treatment. Nasal washings and aspirates are unacceptable for Xpert Xpress SARS-CoV-2/FLU/RSV testing.  Fact Sheet for Patients: EntrepreneurPulse.com.au  Fact Sheet for Healthcare Providers: IncredibleEmployment.be  This test is not yet approved or cleared by the [place] FDA and has been authorized for detection and/or diagnosis of SARS-CoV-2 by FDA under an Emergency Use Authorization (EUA). This EUA will remain in effect (meaning this test can be used) for the duration of the COVID-19 declaration under Section 564(b)(1) of the Act, 21 U.S.C. section 360bbb-3(b)(1), unless the authorization is terminated or revoked.     Resp Syncytial Virus by PCR NEGATIVE NEGATIVE    Comment: (NOTE) Fact Sheet for Patients: EntrepreneurPulse.com.au  Fact Sheet for Healthcare Providers: IncredibleEmployment.be  This test is not yet approved or cleared by the [place] FDA and has been authorized for detection and/or diagnosis of SARS-CoV-2 by FDA under an Emergency Use Authorization (EUA). This EUA will remain in effect (meaning this test can be used) for the duration of the COVID-19 declaration under Section 564(b)(1) of the Act, 21 U.S.C. section 360bbb-3(b)(1), unless the authorization is terminated or revoked.  Performed at [facility], [address] [place], [place] [address]     No current  facility-administered medications for this encounter.   Current Outpatient Medications  Medication Sig Dispense Refill   hydrOXYzine (ATARAX) 25 MG tablet Take 25-50 mg by mouth daily as needed.     ondansetron (ZOFRAN-ODT) 4 MG disintegrating tablet Take 4  mg by mouth every 8 (eight) hours.     pantoprazole (PROTONIX) 40 MG tablet Take 40 mg by mouth daily.     ADDERALL 10 MG tablet Take 10 mg by mouth daily with breakfast.     albuterol (VENTOLIN HFA) 108 (90 Base) MCG/ACT inhaler Inhale 2 puffs into the lungs every 6 (six) hours as needed for wheezing or shortness of breath.     ALPRAZolam (XANAX) 1 MG tablet Take 1 tablet (1 mg total) by mouth 2 (two) times daily as needed for anxiety. 10 tablet 0   amLODipine (NORVASC) 10 MG tablet Take 10 mg by mouth daily.     ascorbic acid (VITAMIN C) 500 MG tablet Take 4,000 mg by mouth in the morning.     aspirin EC 81 MG tablet Take 81 mg by mouth daily. Swallow whole.     atorvastatin (LIPITOR) 40 MG tablet Take 1 tablet (40 mg total) by mouth daily. 90 tablet 3   cloNIDine (CATAPRES) 0.1 MG tablet Take 0.1 mg by mouth 2 (two) times daily.     colestipol (COLESTID) 1 g tablet Take 2 g by mouth daily as needed.     famotidine (PEPCID) 20 MG tablet Take 20 mg by mouth daily.     metoprolol succinate (TOPROL-XL) 50 MG 24 hr tablet Take 1 tablet (50 mg total) by mouth in the morning and at bedtime. 180 tablet 0   montelukast (SINGULAIR) 10 MG tablet Take 10 mg by mouth daily.     Multiple Vitamin (DAILY VITES) tablet Take 1 tablet by mouth daily.     nitroGLYCERIN (NITROSTAT) 0.4 MG SL tablet Place 1 tablet (0.4 mg total) under the tongue every 5 (five) minutes x 3 doses as needed for chest pain. 20 tablet 0   venlafaxine XR (EFFEXOR-XR) 75 MG 24 hr capsule Take 1 capsule (75 mg total) by mouth daily. Future refills should be sent to PCP 15 capsule 0    Musculoskeletal: Strength & Muscle Tone: within normal limits Gait & Station: normal Patient  leans: N/A  Psychiatric Specialty Exam: Physical Exam Vitals and nursing note reviewed.  Constitutional:      Appearance: Normal appearance.  HENT:     Head: Normocephalic.     Nose: Nose normal.  Pulmonary:     Effort: Pulmonary effort is normal.  Musculoskeletal:        General: Normal range of motion.     Cervical back: Normal range of motion.  Neurological:     General: No focal deficit present.     Mental Status: She is alert and oriented to person, place, and time.  Psychiatric:        Attention and Perception: Attention and perception normal.        Mood and Affect: Mood is anxious and depressed.        Speech: Speech normal.        Behavior: Behavior normal. Behavior is cooperative.        Thought Content: Thought content normal.        Cognition and Memory: Cognition and memory normal.        Judgment: Judgment normal.     Review of Systems  Psychiatric/Behavioral:  Positive for depression. The patient is nervous/anxious.   All other systems reviewed and are negative.   Blood pressure (!) 176/91, pulse 76, temperature 97.7 F (36.5 C), temperature source Oral, resp. rate 20, weight 56.7 kg, SpO2 97 %.Body mass index is 19.87 kg/m.  General Appearance:  Casual  Eye Contact:  Good  Speech:  Normal Rate  Volume:  Normal  Mood:  Anxious and Depressed  Affect:  Congruent  Thought Process:  Coherent  Orientation:  Full (Time, Place, and Person)  Thought Content:  WDL and Logical  Suicidal Thoughts:  No  Homicidal Thoughts:  No  Memory:  Immediate;   Good Recent;   Good Remote;   Good  Judgement:  Fair  Insight:  Fair  Psychomotor Activity:  Normal  Concentration:  Concentration: Good and Attention Span: Good  Recall:  Good  Fund of Knowledge:  Good  Language:  Good  Akathisia:  No  Handed:  Right  AIMS (if indicated):     Assets:  Housing Leisure Time Physical Health Resilience Social Support  ADL's:  Intact  Cognition:  WNL  Sleep:         Physical Exam: Physical Exam Vitals and nursing note reviewed.  Constitutional:      Appearance: Normal appearance.  HENT:     Head: Normocephalic.     Nose: Nose normal.  Pulmonary:     Effort: Pulmonary effort is normal.  Musculoskeletal:        General: Normal range of motion.     Cervical back: Normal range of motion.  Neurological:     General: No focal deficit present.     Mental Status: She is alert and oriented to person, place, and time.  Psychiatric:        Attention and Perception: Attention and perception normal.        Mood and Affect: Mood is anxious and depressed.        Speech: Speech normal.        Behavior: Behavior normal. Behavior is cooperative.        Thought Content: Thought content normal.        Cognition and Memory: Cognition and memory normal.        Judgment: Judgment normal.    Review of Systems  Psychiatric/Behavioral:  Positive for depression. The patient is nervous/anxious.   All other systems reviewed and are negative.  Blood pressure (!) 176/91, pulse 76, temperature 97.7 F (36.5 C), temperature source Oral, resp. rate 20, weight 56.7 kg, SpO2 97 %. Body mass index is 19.87 kg/m.  Treatment Plan Summary: Grief, stress reaction with mixed disturbance of emotions and conduct. - Restart alprazolam '1mg'$  BID, venlafaxine '75mg'$  24 hr XR, amlodipine '10mg'$  daily, clonidine 0.'1mg'$  BID, and metoprolol '50mg'$  24XR daily. - Contacted husband Roderic Palau) for safety risk assessment.  Disposition: No evidence of imminent risk to self or others at present.    Waylan Boga, NP 05/30/2022 10:18 AM

## 2022-05-30 NOTE — ED Notes (Signed)
ivc rescinded per NP Lord.Marland KitchenMarland Kitchen

## 2022-07-01 ENCOUNTER — Encounter: Payer: Self-pay | Admitting: *Deleted

## 2022-07-07 ENCOUNTER — Telehealth: Payer: Self-pay | Admitting: Cardiovascular Disease

## 2022-07-07 NOTE — Telephone Encounter (Signed)
Patient is from [place] and would like to know if Dr. Kirke Corin would do a virtual appt instead of an in person visit.

## 2022-07-07 NOTE — Telephone Encounter (Signed)
I am fine doing a virtual visit if she is doing okay.

## 2022-07-08 ENCOUNTER — Ambulatory Visit: Payer: Medicare Other | Attending: Cardiovascular Disease | Admitting: Cardiovascular Disease

## 2022-07-08 ENCOUNTER — Telehealth: Payer: Self-pay | Admitting: *Deleted

## 2022-07-08 ENCOUNTER — Encounter: Payer: Self-pay | Admitting: Cardiovascular Disease

## 2022-07-08 VITALS — BP 127/66 | HR 107 | Ht 66.5 in | Wt 110.0 lb

## 2022-07-08 DIAGNOSIS — E785 Hyperlipidemia, unspecified: Secondary | ICD-10-CM | POA: Diagnosis present

## 2022-07-08 DIAGNOSIS — I2510 Atherosclerotic heart disease of native coronary artery without angina pectoris: Secondary | ICD-10-CM | POA: Diagnosis present

## 2022-07-08 DIAGNOSIS — R000 Tachycardia, unspecified: Secondary | ICD-10-CM | POA: Diagnosis present

## 2022-07-08 DIAGNOSIS — I10 Essential (primary) hypertension: Secondary | ICD-10-CM

## 2022-07-08 NOTE — Progress Notes (Signed)
Formatting of this note is different from the original.  Images from the original note were not included.      Virtual Visit via Video Note     Because of Kristine Banks's co-morbid illnesses, she is at least at moderate risk for complications without adequate follow up.  This format is felt to be most appropriate for this patient at this time.  All issues noted in this document were discussed and addressed.  A limited physical exam was performed with this format.  Please refer to the patient's chart for her consent to telehealth for [facility].        Date:  07/08/2022     ID:  Kristine Banks, DOB 06-26-1955, MRN [id number]  The patient was identified using 2 identifiers.    Patient Location: Home  Provider Location: Office/Clinic    PCP:  Corrie Dandy, MD    [facility] Providers  Cardiologist:  None {    Evaluation Performed:  Follow-Up Visit    Chief Complaint: Doing well overall    History of Present Illness:      Kristine Banks is a 67 y.o. female who was seen via video visit for a follow-up regarding mild nonobstructive coronary artery disease and previous stress-induced cardiomyopathy.    She has history of essential hypertension, remote alcohol use, depression, history of pancreatitis, mild nonobstructive coronary artery disease on previous cardiac catheterization in 2021, stress-induced cardiomyopathy in 2021, marijuana use and anxiety.  She had previous cardiac catheterization in 2021 for stress-induced cardiomyopathy in the setting of acute cholecystitis.  She was found to have mild nonobstructive coronary artery disease.  She has known history of sinus tachycardia and PVCs.    She lives in [place] but was visiting a friend last year and was hospitalized at [facility] in May 2023 with abdominal pain and weakness and was found to have gastroenteritis and mildly elevated troponin.  She was significantly tachycardic and hypertensive on presentation.  Echocardiogram showed normal LV  systolic function.  She underwent a follow-up cardiac CTA in May which showed a calcium score of 101 with mild 25% stenosis in the LAD.    She has been doing well with no chest pain, shortness of breath or palpitations.  Her stress improved.    Past Medical History:   Diagnosis Date    Coronary artery disease     Hypertension     Metabolic acidosis, increased anion gap (IAG) 08/05/2021    Pancreatitis      Past Surgical History:   Procedure Laterality Date    CARDIAC CATHETERIZATION      2021 no stents    CHOLECYSTECTOMY      PLACEMENT OF BREAST IMPLANTS Bilateral        Current Meds   Medication Sig    ADDERALL 10 MG tablet Take 10 mg by mouth daily with breakfast.    albuterol (VENTOLIN HFA) 108 (90 Base) MCG/ACT inhaler Inhale 2 puffs into the lungs every 6 (six) hours as needed for wheezing or shortness of breath.    ALPRAZolam (XANAX) 1 MG tablet Take 1 tablet (1 mg total) by mouth 2 (two) times daily as needed for anxiety.    amLODipine (NORVASC) 10 MG tablet Take 10 mg by mouth daily.    ascorbic acid (VITAMIN C) 500 MG tablet Take 4,000 mg by mouth in the morning.    aspirin EC 81 MG tablet Take 81 mg by mouth daily. Swallow whole.  atorvastatin (LIPITOR) 40 MG tablet Take 1 tablet (40 mg total) by mouth daily.    cloNIDine (CATAPRES) 0.1 MG tablet Take 0.1 mg by mouth 2 (two) times daily.    colestipol (COLESTID) 1 g tablet Take 2 g by mouth daily as needed.    famotidine (PEPCID) 20 MG tablet Take 20 mg by mouth daily.    metoprolol succinate (TOPROL-XL) 50 MG 24 hr tablet Take 1 tablet (50 mg total) by mouth in the morning and at bedtime.    montelukast (SINGULAIR) 10 MG tablet Take 10 mg by mouth daily.    Multiple Vitamin (DAILY VITES) tablet Take 3 tablets by mouth daily.    nitroGLYCERIN (NITROSTAT) 0.4 MG SL tablet Place 1 tablet (0.4 mg total) under the tongue every 5 (five) minutes x 3 doses as needed for chest pain.    pantoprazole (PROTONIX) 40 MG tablet Take 40 mg by mouth daily.    tiotropium  (SPIRIVA) 18 MCG inhalation capsule Place 18 mcg into inhaler and inhale daily.    venlafaxine XR (EFFEXOR-XR) 150 MG 24 hr capsule Take 150 mg by mouth daily with breakfast.       Allergies:   Ondansetron hcl and Short ragweed pollen ext     Social History     Tobacco Use    Smoking status: Never    Smokeless tobacco: Never   Vaping Use    Vaping Use: Every day   Substance Use Topics    Alcohol use: Not Currently    Drug use: Yes     Types: Marijuana     Comment: Delta 8 vaping       Family Hx:  The patient's family history includes Cancer in her mother; Factor V Leiden deficiency in an other family member; Hypertension in her brother and mother; Hypothyroidism in her mother; Stroke in her maternal grandmother; Thyroid disease in her brother.    ROS:    Please see the history of present illness.       All other systems reviewed and are negative.    Prior CV studies:    The following studies were reviewed today:    Labs/Other Tests and Data Reviewed:      EKG:  No ECG reviewed.    Recent Labs:  08/07/2021: Magnesium 2.4  02/19/2022: TSH 0.906  05/29/2022: ALT 24; BUN 17; Creatinine, Ser 1.02; Hemoglobin 13.4; Platelets 436; Potassium 3.9; Sodium 137     Recent Lipid Panel  Lab Results   Component Value Date/Time    CHOL 147 10/18/2021 10:15 AM    TRIG 48 10/18/2021 10:15 AM    HDL 62 10/18/2021 10:15 AM    CHOLHDL 2.4 10/18/2021 10:15 AM    LDLCALC 75 10/18/2021 10:15 AM     Wt Readings from Last 3 Encounters:   07/08/22 110 lb (49.9 kg)   05/29/22 125 lb (56.7 kg)   02/19/22 109 lb 6 oz (49.6 kg)       Risk Assessment/Calculations:          Objective:      Vital Signs:  BP 127/66 (BP Location: Left Arm, Patient Position: Sitting, Cuff Size: Normal)   Pulse (!) 107   Ht 5' 6.5" (1.689 m)   Wt 110 lb (49.9 kg)   BMI 17.49 kg/m      VITAL SIGNS:  reviewed  GEN:  no acute distress    ASSESSMENT & PLAN:      1.  Coronary artery disease involving native coronary arteries  without angina: Previous cardiac catheterization  in 2021 and cardiac CTA in 2023 both showed mild nonobstructive coronary artery disease.  Continue low-dose aspirin and treatment of risk factors.  2.  Essential hypertension: Blood pressure is well-controlled on current medications.  3.  Tachycardia: She is known to have sinus tachycardia and PVCs.  Symptoms are well-controlled with metoprolol.  4.  Hyperlipidemia: Continue treatment with a statin with a target LDL of less than 70.    The patient lives in [place] 2 hours away and currently she has no active cardiac issues.  She can follow-up with [place] as needed.  If she has cardiac related issues in the future, I advised her to establish with a cardiologist close by to where she lives.            Time:    Today, I have spent 20 minutes with the patient with telehealth technology discussing the above problems.      Medication Adjustments/Labs and Tests Ordered:  Current medicines are reviewed at length with the patient today.  Concerns regarding medicines are outlined above.     Tests Ordered:  No orders of the defined types were placed in this encounter.    Medication Changes:  No orders of the defined types were placed in this encounter.    Follow Up: As needed.    Signed,  Lorine Bears, MD   07/08/2022 2:37 PM     [facility]    Electronically signed by Iran Ouch, MD at 07/09/2022 11:07 AM EDT

## 2022-07-08 NOTE — Telephone Encounter (Signed)
  Patient Consent for Virtual Visit        Veronica Warner has provided verbal consent on 07/08/2022 for a virtual visit (video or telephone).   CONSENT FOR VIRTUAL VISIT FOR:  Veronica Warner  By participating in this virtual visit I agree to the following:  I hereby voluntarily request, consent and authorize [facility] and its employed or contracted physicians, physician assistants, nurse practitioners or other licensed health care professionals (the Practitioner), to provide me with telemedicine health care services (the "Services") as deemed necessary by the treating Practitioner. I acknowledge and consent to receive the Services by the Practitioner via telemedicine. I understand that the telemedicine visit will involve communicating with the Practitioner through live audiovisual communication technology and the disclosure of certain medical information by electronic transmission. I acknowledge that I have been given the opportunity to request an in-person assessment or other available alternative prior to the telemedicine visit and am voluntarily participating in the telemedicine visit.  I understand that I have the right to withhold or withdraw my consent to the use of telemedicine in the course of my care at any time, without affecting my right to future care or treatment, and that the Practitioner or I may terminate the telemedicine visit at any time. I understand that I have the right to inspect all information obtained and/or recorded in the course of the telemedicine visit and may receive copies of available information for a reasonable fee.  I understand that some of the potential risks of receiving the Services via telemedicine include:  Delay or interruption in medical evaluation due to technological equipment failure or disruption; Information transmitted may not be sufficient (e.g. poor resolution of images) to allow for appropriate medical decision making by the  Practitioner; and/or  In rare instances, security protocols could fail, causing a breach of personal health information.  Furthermore, I acknowledge that it is my responsibility to provide information about my medical history, conditions and care that is complete and accurate to the best of my ability. I acknowledge that Practitioner's advice, recommendations, and/or decision may be based on factors not within their control, such as incomplete or inaccurate data provided by me or distortions of diagnostic images or specimens that may result from electronic transmissions. I understand that the practice of medicine is not an exact science and that Practitioner makes no warranties or guarantees regarding treatment outcomes. I acknowledge that a copy of this consent can be made available to me via my patient portal [facility] MyChart), or I can request a printed copy by calling the office of [facility].    I understand that my insurance will be billed for this visit.   I have read or had this consent read to me. I understand the contents of this consent, which adequately explains the benefits and risks of the Services being provided via telemedicine.  I have been provided ample opportunity to ask questions regarding this consent and the Services and have had my questions answered to my satisfaction. I give my informed consent for the services to be provided through the use of telemedicine in my medical care

## 2022-07-08 NOTE — Telephone Encounter (Signed)
Nurse informed pt we will call her to go over medication and collect vitals information. Then she will log into mychart to join video visit. Pt verbalized understanding.

## 2022-07-08 NOTE — Patient Instructions (Signed)
Medication Instructions:  No changes *If you need a refill on your cardiac medications before your next appointment, please call your pharmacy*   Lab Work: None ordered If you have labs (blood work) drawn today and your tests are completely normal, you will receive your results only by: MyChart Message (if you have MyChart) OR A paper copy in the mail If you have any lab test that is abnormal or we need to change your treatment, we will call you to review the results.   Testing/Procedures: None ordered   Follow-Up: At [facility], you and your health needs are our priority.  As part of our continuing mission to provide you with exceptional heart care, we have created designated Provider Care Teams.  These Care Teams include your primary Cardiologist (physician) and Advanced Practice Providers (APPs -  Physician Assistants and Nurse Practitioners) who all work together to provide you with the care you need, when you need it.  We recommend signing up for the patient portal called "MyChart".  Sign up information is provided on this After Visit Summary.  MyChart is used to connect with patients for Virtual Visits (Telemedicine).  Patients are able to view lab/test results, encounter notes, upcoming appointments, etc.  Non-urgent messages can be sent to your provider as well.   To learn more about what you can do with MyChart, go to [email or web address].    Your next appointment:   Follow up as needed  

## 2022-07-08 NOTE — Telephone Encounter (Signed)
Left a message for the patient to call back.  

## 2022-07-08 NOTE — Progress Notes (Signed)
Virtual Visit via Video Note   Because of Veronica Warner's co-morbid illnesses, she is at least at moderate risk for complications without adequate follow up.  This format is felt to be most appropriate for this patient at this time.  All issues noted in this document were discussed and addressed.  A limited physical exam was performed with this format.  Please refer to the patient's chart for her consent to telehealth for [facility].       Date:  07/08/2022   ID:  Veronica Warner, DOB Mar 05, 1956, MRN [id number] The patient was identified using 2 identifiers.  Patient Location: Home Provider Location: Office/Clinic   PCP:  Corrie Dandy, MD   [facility] Providers Cardiologist:  None {   Evaluation Performed:  Follow-Up Visit  Chief Complaint: Doing well overall  History of Present Illness:    Veronica Warner is a 67 y.o. female who was seen via video visit for a follow-up regarding mild nonobstructive coronary artery disease and previous stress-induced cardiomyopathy.  She has history of essential hypertension, remote alcohol use, depression, history of pancreatitis, mild nonobstructive coronary artery disease on previous cardiac catheterization in 2021, stress-induced cardiomyopathy in 2021, marijuana use and anxiety. She had previous cardiac catheterization in 2021 for stress-induced cardiomyopathy in the setting of acute cholecystitis.  She was found to have mild nonobstructive coronary artery disease.  She has known history of sinus tachycardia and PVCs.  She lives in [place] but was visiting a friend last year and was hospitalized at [facility] in May 2023 with abdominal pain and weakness and was found to have gastroenteritis and mildly elevated troponin.  She was significantly tachycardic and hypertensive on presentation.  Echocardiogram showed normal LV systolic function.  She underwent a follow-up cardiac CTA in May which showed a calcium score of  101 with mild 25% stenosis in the LAD.  She has been doing well with no chest pain, shortness of breath or palpitations.  Her stress improved.  Past Medical History:  Diagnosis Date   Coronary artery disease    Hypertension    Metabolic acidosis, increased anion gap (IAG) 08/05/2021   Pancreatitis    Past Surgical History:  Procedure Laterality Date   CARDIAC CATHETERIZATION     2021 no stents   CHOLECYSTECTOMY     PLACEMENT OF BREAST IMPLANTS Bilateral      Current Meds  Medication Sig   ADDERALL 10 MG tablet Take 10 mg by mouth daily with breakfast.   albuterol (VENTOLIN HFA) 108 (90 Base) MCG/ACT inhaler Inhale 2 puffs into the lungs every 6 (six) hours as needed for wheezing or shortness of breath.   ALPRAZolam (XANAX) 1 MG tablet Take 1 tablet (1 mg total) by mouth 2 (two) times daily as needed for anxiety.   amLODipine (NORVASC) 10 MG tablet Take 10 mg by mouth daily.   ascorbic acid (VITAMIN C) 500 MG tablet Take 4,000 mg by mouth in the morning.   aspirin EC 81 MG tablet Take 81 mg by mouth daily. Swallow whole.   atorvastatin (LIPITOR) 40 MG tablet Take 1 tablet (40 mg total) by mouth daily.   cloNIDine (CATAPRES) 0.1 MG tablet Take 0.1 mg by mouth 2 (two) times daily.   colestipol (COLESTID) 1 g tablet Take 2 g by mouth daily as needed.   famotidine (PEPCID) 20 MG tablet Take 20 mg by mouth daily.   metoprolol succinate (TOPROL-XL) 50 MG 24 hr tablet Take 1 tablet (50  mg total) by mouth in the morning and at bedtime.   montelukast (SINGULAIR) 10 MG tablet Take 10 mg by mouth daily.   Multiple Vitamin (DAILY VITES) tablet Take 3 tablets by mouth daily.   nitroGLYCERIN (NITROSTAT) 0.4 MG SL tablet Place 1 tablet (0.4 mg total) under the tongue every 5 (five) minutes x 3 doses as needed for chest pain.   pantoprazole (PROTONIX) 40 MG tablet Take 40 mg by mouth daily.   tiotropium (SPIRIVA) 18 MCG inhalation capsule Place 18 mcg into inhaler and inhale daily.   venlafaxine  XR (EFFEXOR-XR) 150 MG 24 hr capsule Take 150 mg by mouth daily with breakfast.     Allergies:   Ondansetron hcl and Short ragweed pollen ext   Social History   Tobacco Use   Smoking status: Never   Smokeless tobacco: Never  Vaping Use   Vaping Use: Every day  Substance Use Topics   Alcohol use: Not Currently   Drug use: Yes    Types: Marijuana    Comment: Delta 8 vaping     Family Hx: The patient's family history includes Cancer in her mother; Factor V Leiden deficiency in an other family member; Hypertension in her brother and mother; Hypothyroidism in her mother; Stroke in her maternal grandmother; Thyroid disease in her brother.  ROS:   Please see the history of present illness.     All other systems reviewed and are negative.   Prior CV studies:   The following studies were reviewed today:    Labs/Other Tests and Data Reviewed:    EKG:  No ECG reviewed.  Recent Labs: 08/07/2021: Magnesium 2.4 02/19/2022: TSH 0.906 05/29/2022: ALT 24; BUN 17; Creatinine, Ser 1.02; Hemoglobin 13.4; Platelets 436; Potassium 3.9; Sodium 137   Recent Lipid Panel Lab Results  Component Value Date/Time   CHOL 147 10/18/2021 10:15 AM   TRIG 48 10/18/2021 10:15 AM   HDL 62 10/18/2021 10:15 AM   CHOLHDL 2.4 10/18/2021 10:15 AM   LDLCALC 75 10/18/2021 10:15 AM    Wt Readings from Last 3 Encounters:  07/08/22 110 lb (49.9 kg)  05/29/22 125 lb (56.7 kg)  02/19/22 109 lb 6 oz (49.6 kg)     Risk Assessment/Calculations:          Objective:    Vital Signs:  BP 127/66 (BP Location: Left Arm, Patient Position: Sitting, Cuff Size: Normal)   Pulse (!) 107   Ht 5' 6.5" (1.689 m)   Wt 110 lb (49.9 kg)   BMI 17.49 kg/m    VITAL SIGNS:  reviewed GEN:  no acute distress  ASSESSMENT & PLAN:    1.  Coronary artery disease involving native coronary arteries without angina: Previous cardiac catheterization in 2021 and cardiac CTA in 2023 both showed mild nonobstructive coronary artery  disease.  Continue low-dose aspirin and treatment of risk factors. 2.  Essential hypertension: Blood pressure is well-controlled on current medications. 3.  Tachycardia: She is known to have sinus tachycardia and PVCs.  Symptoms are well-controlled with metoprolol. 4.  Hyperlipidemia: Continue treatment with a statin with a target LDL of less than 70.   The patient lives in [place] 2 hours away and currently she has no active cardiac issues.  She can follow-up with [place] as needed.  If she has cardiac related issues in the future, I advised her to establish with a cardiologist close by to where she lives.            Time:  Today, I have spent 20 minutes with the patient with telehealth technology discussing the above problems.     Medication Adjustments/Labs and Tests Ordered: Current medicines are reviewed at length with the patient today.  Concerns regarding medicines are outlined above.   Tests Ordered: No orders of the defined types were placed in this encounter.   Medication Changes: No orders of the defined types were placed in this encounter.   Follow Up: As needed.  Signed, Lorine Bears, MD  07/08/2022 2:37 PM    [facility]

## 2022-07-08 NOTE — Telephone Encounter (Signed)
Attempted to contact the patient. No answer- I left a message to please call back.  If she is stable she can be converted to a virtual visit. However, she will need to be consented for that.

## 2022-07-08 NOTE — Telephone Encounter (Signed)
Patient is returning call about doing a virtual appt. Patient stated that she is doing fine and she has her PCP keeping track of her BP. Patient would like a call back to go over how the virtual appt will work and what she should expect. Please advise

## 2022-08-08 ENCOUNTER — Other Ambulatory Visit: Payer: Self-pay | Admitting: *Deleted

## 2022-08-08 MED ORDER — METOPROLOL SUCCINATE ER 50 MG PO TB24: 50.0000 mg | ORAL_TABLET | Freq: Two times a day (BID) | ORAL | 0 refills | Status: AC

## 2022-12-12 ENCOUNTER — Telehealth: Payer: Self-pay | Admitting: Cardiovascular Disease

## 2022-12-12 NOTE — Telephone Encounter (Signed)
Pre-operative Risk Assessment    Patient Name: Veronica Warner  DOB: Aug 03, 1955 MRN: [id number]     Request for Surgical Clearance    Procedure:  Dental Extraction - Amount of Teeth to be Pulled:  not  indicating what is going on  Date of Surgery:  Clearance TBD                                 Surgeon:  Dr. Marval Regal Group or Practice Name:  [facility] surgery Phone number:  [phone] Fax number:  [phone]   Type of Clearance Requested:   - Medical    Type of Anesthesia:  General    Additional requests/questions:    SignedShawna Orleans   12/12/2022, 10:01 AM

## 2022-12-15 NOTE — Telephone Encounter (Signed)
Name: Veronica Warner  DOB: July 12, 1955  MRN: [id number]  Primary Cardiologist: Lorine Bears, MD  Chart reviewed as part of pre-operative protocol coverage. Because of Veronica Warner past medical history and time since last visit, she will require a follow-up in-office visit in order to better assess preoperative cardiovascular risk. Notes indicate patient lives 2 hours away in [place]. [place] [employer] laws now prohibit virtual visits outside of [place] so needs in person visit if she would like [facility] to assist with this medical clearance given need for general anesthesia. Dr. Kirke Corin had previously recommended that if she were to have cardiac related issues in the future, she should establish with a cardiologist where she is locally. If she has not done so yet and wants [place] to handle this, please have her return for visit as advised.  Pre-op covering staff: - Please schedule appointment and call patient to inform them. If patient already had an upcoming appointment within acceptable timeframe, please add "pre-op clearance" to the appointment notes so provider is aware. - Please contact requesting surgeon's office via preferred method (i.e, phone, fax) to inform them of need for appointment prior to surgery.  Pharm commentary: as below CMA indicates dental office is not asking for ASA hold. Do not see any conditions that would warrant SBE ppx but can be finalized at time of OV.  Laurann Montana, PA-C  12/15/2022, 11:23 AM

## 2022-12-15 NOTE — Telephone Encounter (Signed)
I called dentist office and spoke with Tresa Endo. Pt is having 1 tooth extraction and 1 implant placed. They are not asking for Asa hold.

## 2022-12-15 NOTE — Telephone Encounter (Signed)
1st attempt to reach pt regarding surgical clearance and the need for an in office appointment.  Left a message for pt to call back and ask for the preop team.

## 2022-12-15 NOTE — Telephone Encounter (Signed)
Pre-op team,   Please clarify how many teeth are being extracted.    Thank you!  DW

## 2022-12-16 NOTE — Telephone Encounter (Signed)
Pt returned my call.  She is going to stick in [place].  She will reschedule her surgery and get in to see her old Cardiologist.  She is aware that the surgeon's office will have to send them a request.

## 2023-04-07 ENCOUNTER — Encounter

## 2023-07-16 ENCOUNTER — Telehealth: Payer: Self-pay | Admitting: Family Medicine

## 2023-07-16 NOTE — Telephone Encounter (Signed)
 Pt called, ,was referred to Dr. Felipe Horton by a friend. After discussing multiple issues with pt, this is NOT a Sports Medicine visit. Pt informed to contact PCP for appropriate specialist referral based on her current issues. FYI only.

## 2023-07-26 IMAGING — CT CT ABD-PELV W/ CM
2 of 5 series · 16 of 46 positions shown, 18 images · IV contrast (agent unspecified)
Comparison: None Available.

CLINICAL DATA: Abdominal pain, weakness, nausea/vomiting

EXAM:
CT ABDOMEN AND PELVIS WITH CONTRAST
TECHNIQUE: Multidetector CT imaging of the abdomen and pelvis was performed
using the standard protocol following bolus administration of
intravenous contrast.

[Series 3: routine abd/pel with · axial · 0.73mm/px · z∈[-1056,-671]mm · 13 of 87 slices shown, 15 images]
[im 5/87  soft-tissue]
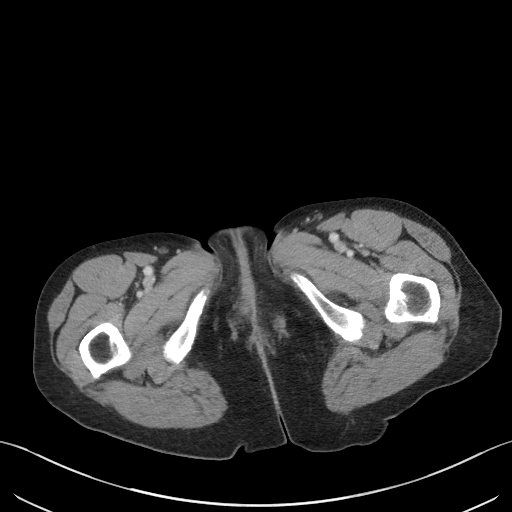
[im 5/87  bone]
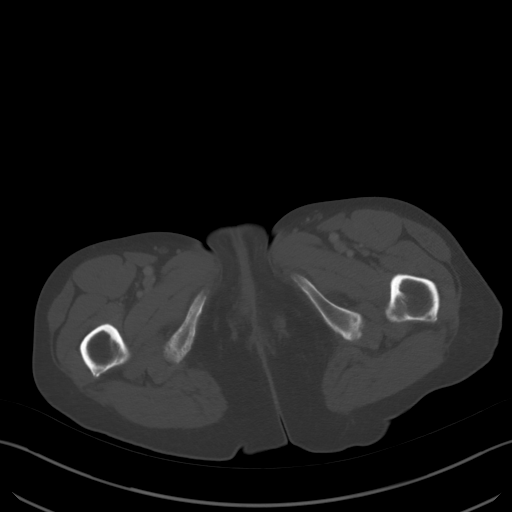
[im 10/87  soft-tissue]
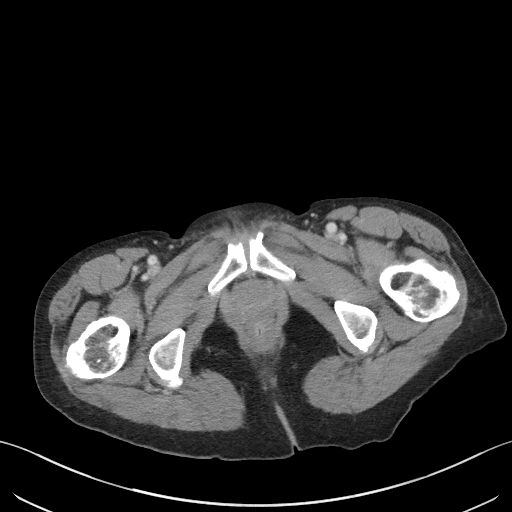
[im 20/87  soft-tissue]
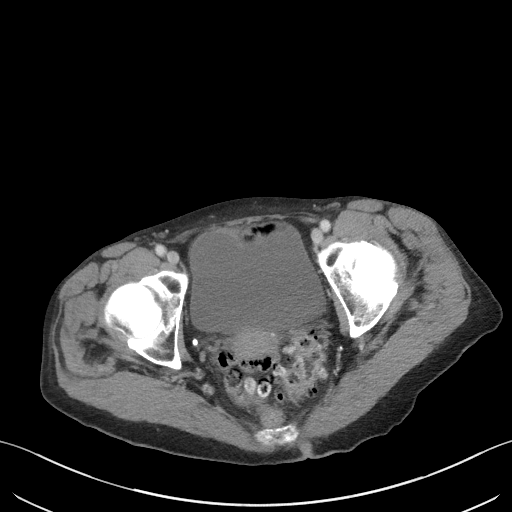
[im 24/87  soft-tissue]
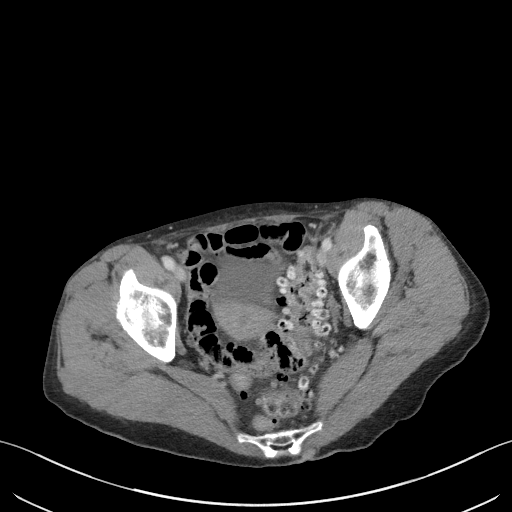
[im 29/87  soft-tissue]
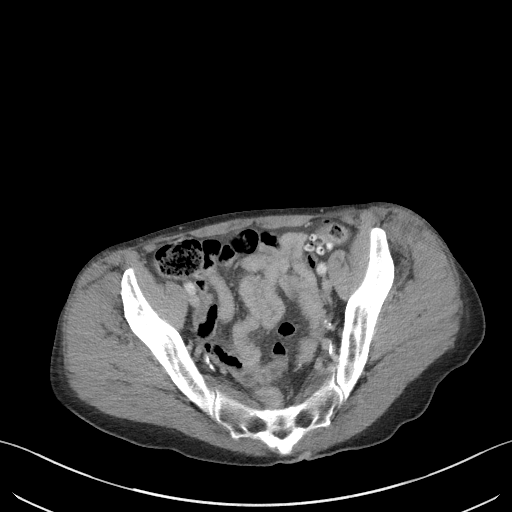
[im 39/87  soft-tissue]
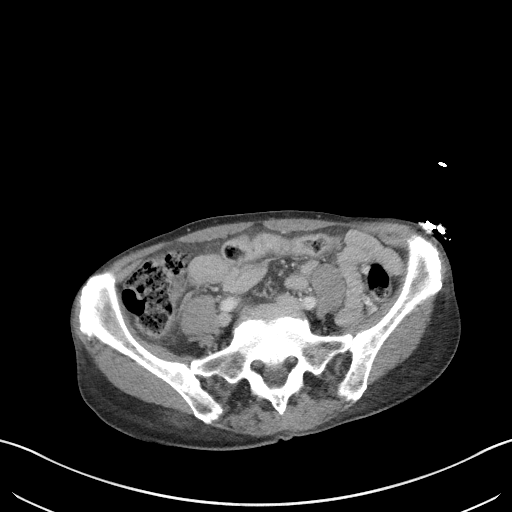
[im 44/87  soft-tissue]
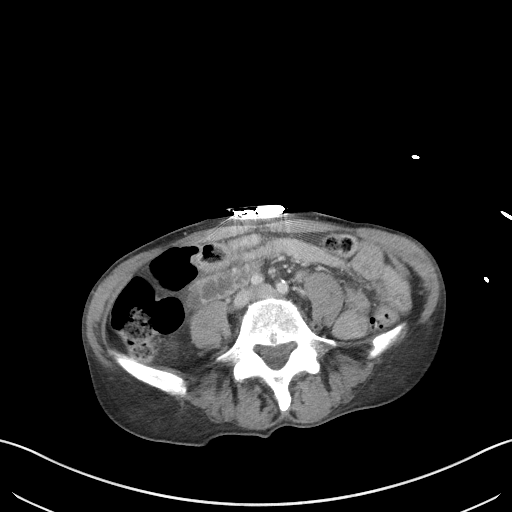
[im 48/87  soft-tissue]
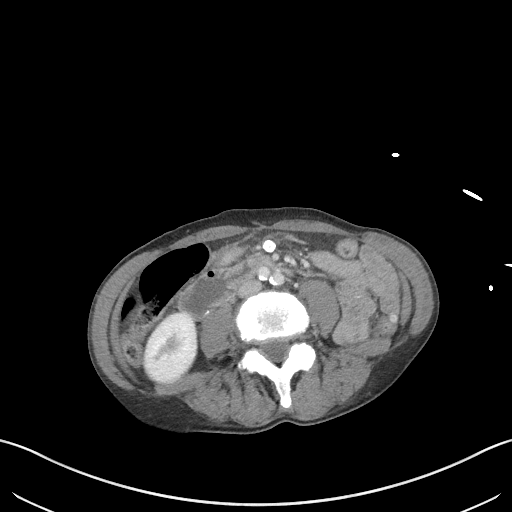
[im 58/87  soft-tissue]
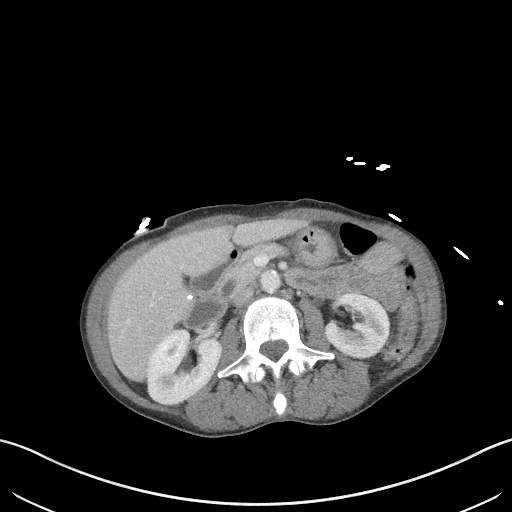
[im 58/87  bone]
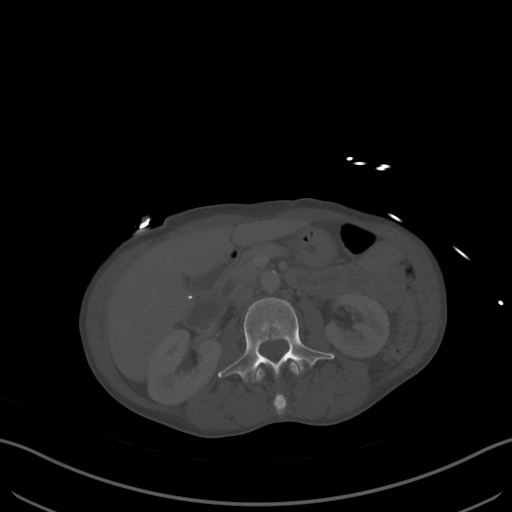
[im 63/87  soft-tissue]
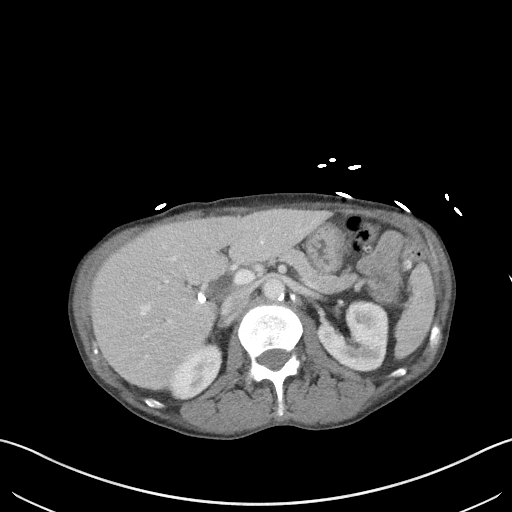
[im 67/87  soft-tissue]
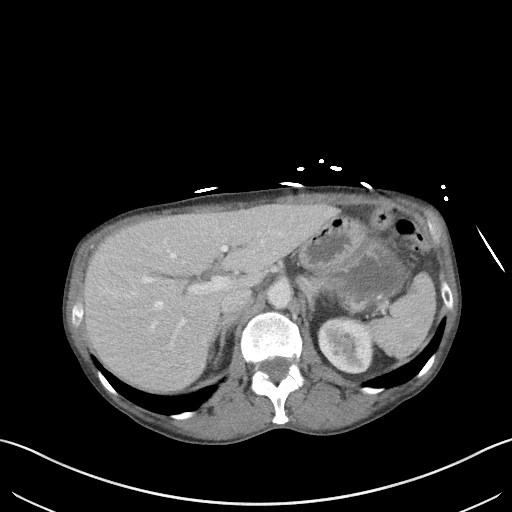
[im 77/87  soft-tissue]
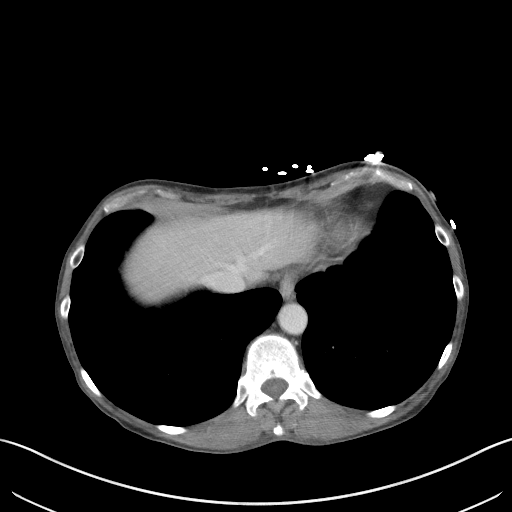
[im 82/87  soft-tissue]
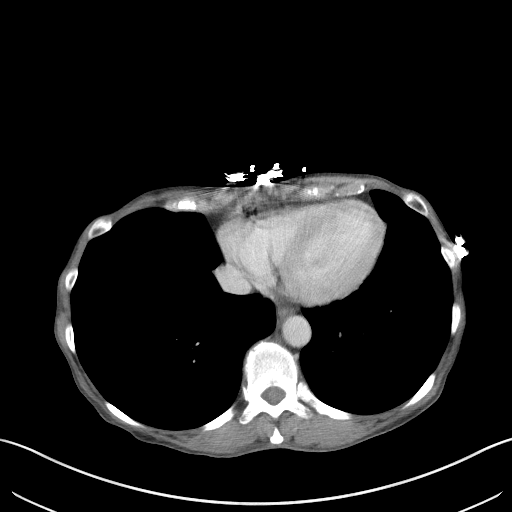

[Series 6: coronal st · coronal · 0.71mm/px · 3 of 75 slices shown]
[im 25/75  soft-tissue]
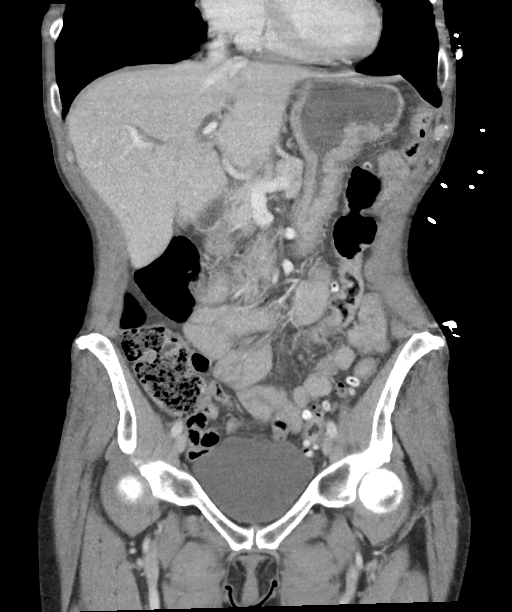
[im 33/75  soft-tissue]
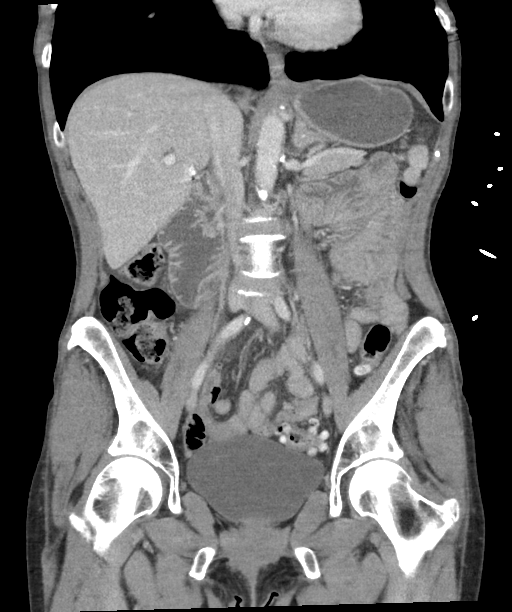
[im 42/75  soft-tissue]
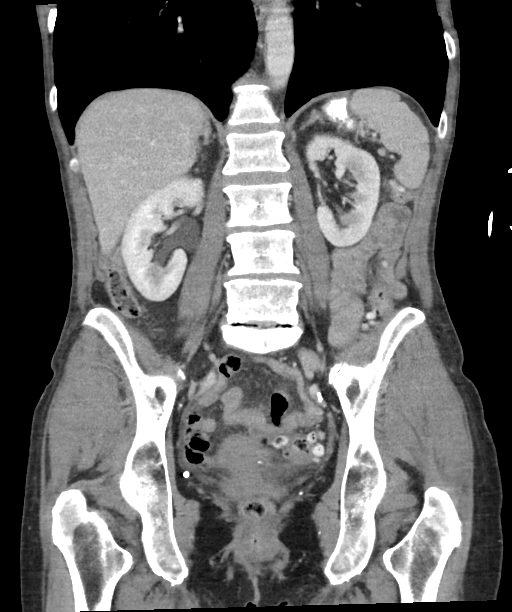

[16 of 46 positions shown; findings below may reference images not displayed]

RADIATION DOSE REDUCTION: This exam was performed according to the
departmental dose-optimization program which includes automated
exposure control, adjustment of the mA and/or kV according to
patient size and/or use of iterative reconstruction technique.

CONTRAST:  75mL OMNIPAQUE IOHEXOL 300 MG/ML  SOLN
FINDINGS: Lower chest: Lung bases are clear.

Hepatobiliary: Liver is within normal limits.

Status post cholecystectomy. No intrahepatic ductal dilatation.
Common duct measures 15 mm and smoothly tapers at the ampulla,
likely postsurgical.

Pancreas: Within normal limits.

Spleen: Within normal limits.

Adrenals/Urinary Tract: Adrenal glands are within normal limits.

Subcentimeter right lower pole renal cyst. Left kidney is within
normal limits. No hydronephrosis.

Bladder is within normal limits.

Stomach/Bowel: Stomach is within normal limits.

No evidence of bowel obstruction.

Appendix is not discretely visualized.

Extensive left colonic diverticulosis, without convincing
pericolonic inflammatory changes to suggest acute diverticulitis.

Vascular/Lymphatic: No evidence of abdominal aortic aneurysm.

Atherosclerotic calcifications of the abdominal aorta and branch
vessels.

No suspicious abdominopelvic lymphadenopathy.

Reproductive: Uterus is within normal limits.

No adnexal masses.

Other: No abdominopelvic ascites.

Faint mesenteric stranding beneath the anterior abdominal wall,
nonspecific. 9 mm coarse calcified lesion along the anterior
abdominal mesentery (series 3/image 40), indeterminate.

Musculoskeletal: Visualized osseous structures are within normal
limits.
IMPRESSION: Extensive left colonic diverticulosis, without evidence of
diverticulitis.

No evidence of bowel obstruction. Appendix is not discretely
visualized.

Status post cholecystectomy.

## 2023-07-26 IMAGING — CR DG CHEST 2V
3 series · 3 of 3 positions shown · non-contrast
Comparison: None Available.

CLINICAL DATA: Shortness of breath

EXAM:
CHEST - 2 VIEW

[chest lat (1 of 2)]
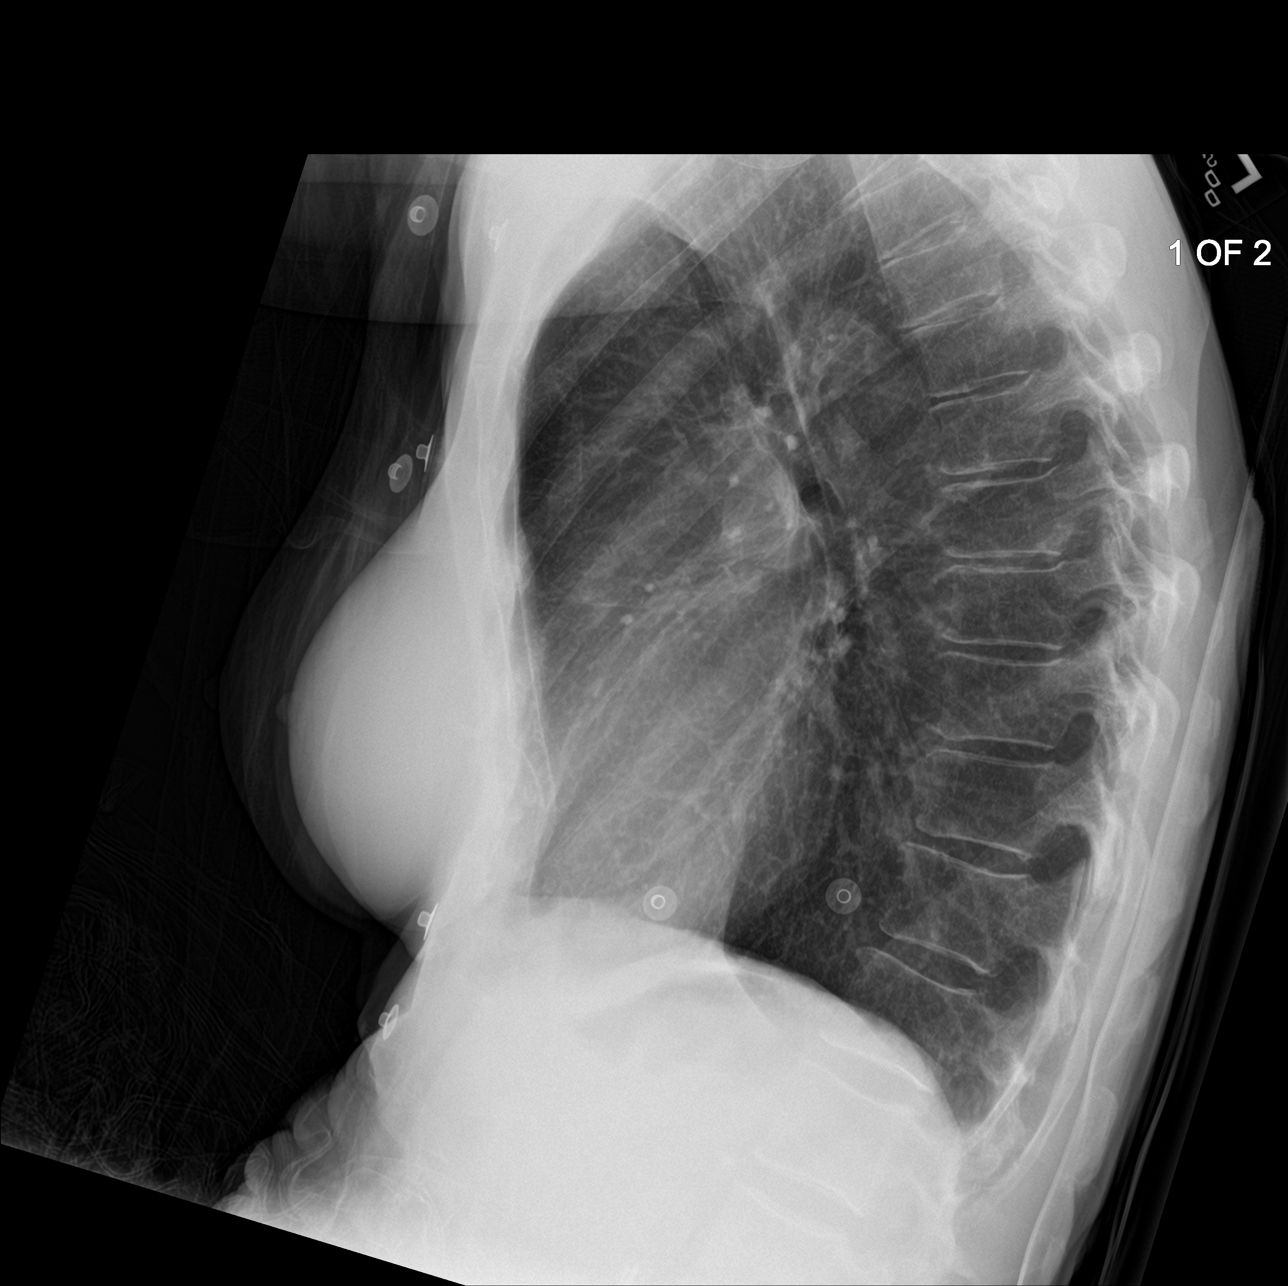

[chest ap]
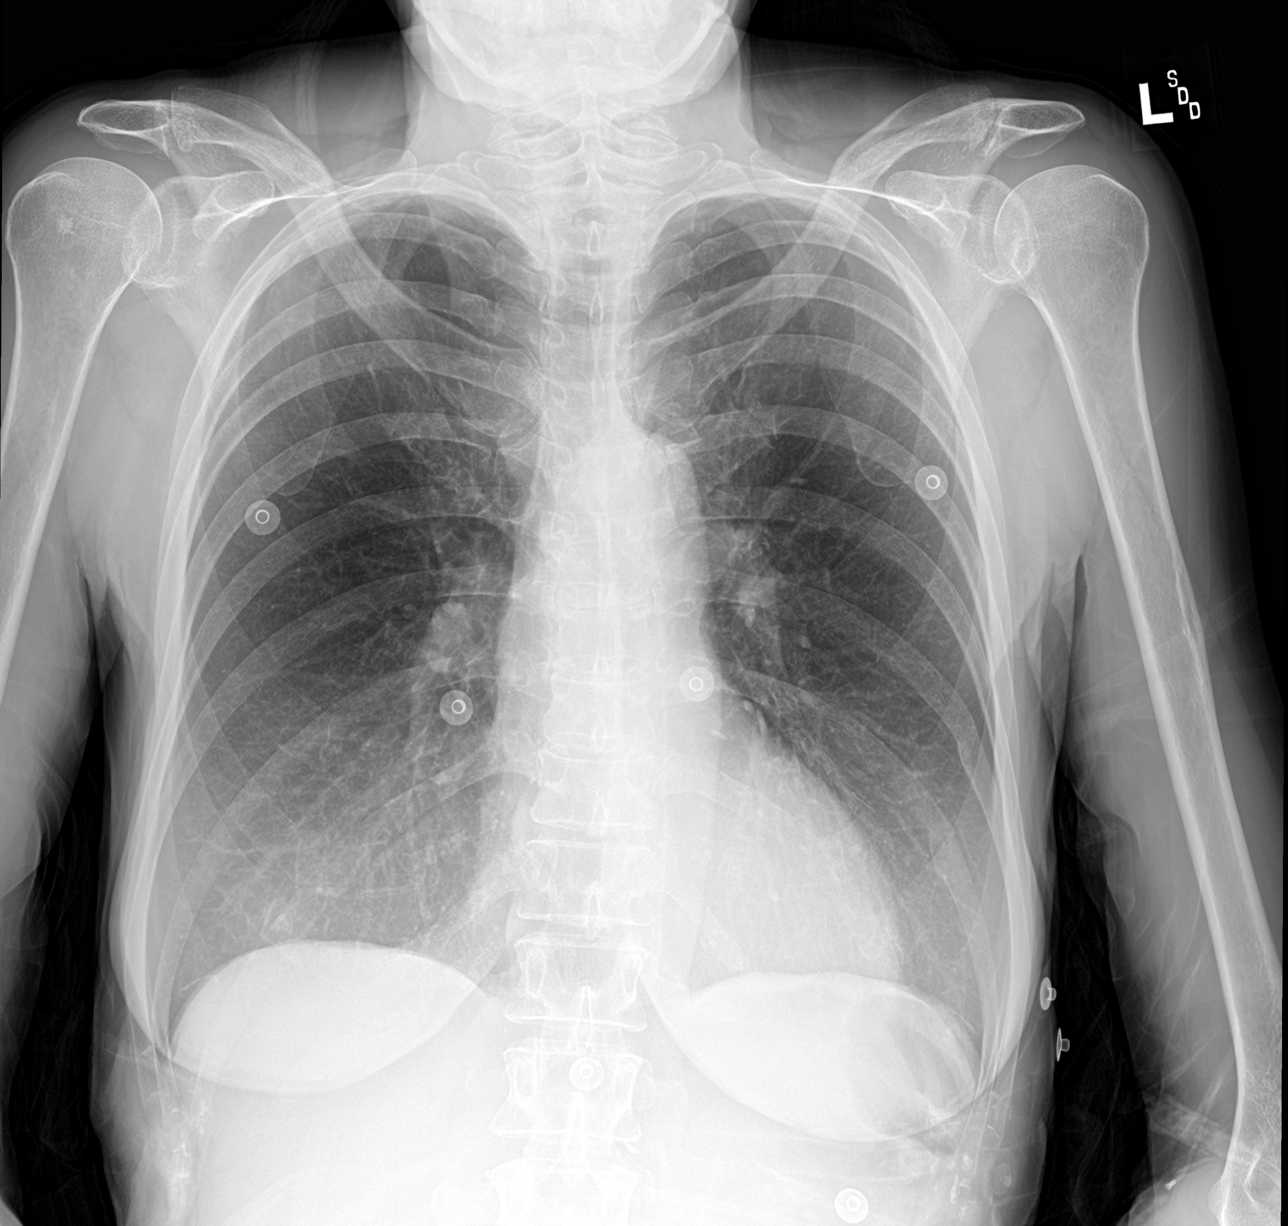

[chest lat (2 of 2)]
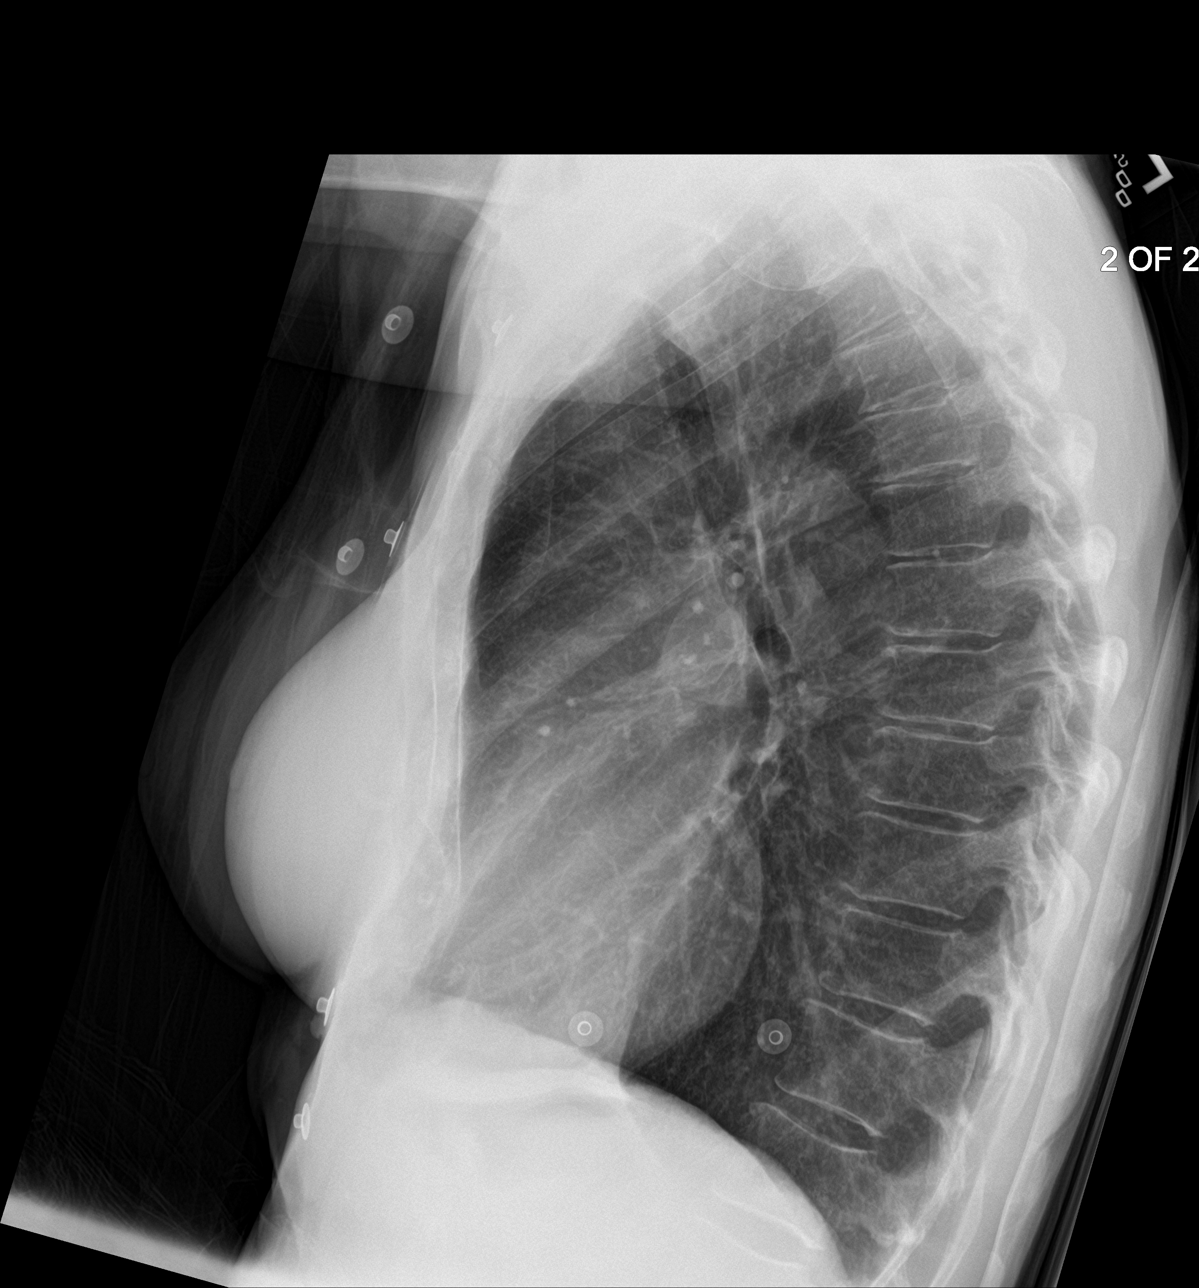

[3 of 3 positions shown; findings below may reference images not displayed]

FINDINGS: Lungs are clear.  No pleural effusion or pneumothorax.

The heart is normal in size.

Visualized osseous structures are within normal limits.
IMPRESSION: Normal chest radiographs.
